# Patient Record
Sex: Female | Born: 1985 | Race: White | Hispanic: Yes | Marital: Single | Smoking: Former smoker
Health system: Southern US, Community
[De-identification: ages and names within clinical notes are randomized; demographics above are authoritative.]

## PROBLEM LIST (undated history)

## (undated) DIAGNOSIS — F419 Anxiety disorder, unspecified: Secondary | ICD-10-CM

## (undated) DIAGNOSIS — Q6589 Other specified congenital deformities of hip: Secondary | ICD-10-CM

## (undated) DIAGNOSIS — R569 Unspecified convulsions: Secondary | ICD-10-CM

## (undated) DIAGNOSIS — E079 Disorder of thyroid, unspecified: Secondary | ICD-10-CM

## (undated) DIAGNOSIS — M810 Age-related osteoporosis without current pathological fracture: Secondary | ICD-10-CM

## (undated) HISTORY — DX: Unspecified convulsions: R56.9

## (undated) HISTORY — DX: Age-related osteoporosis without current pathological fracture: M81.0

## (undated) HISTORY — PX: TUBAL LIGATION: SHX77

## (undated) HISTORY — DX: Disorder of thyroid, unspecified: E07.9

## (undated) HISTORY — DX: Anxiety disorder, unspecified: F41.9

## (undated) HISTORY — PX: TOTAL HIP ARTHROPLASTY: SHX124

## (undated) HISTORY — DX: Other specified congenital deformities of hip: Q65.89

## (undated) HISTORY — PX: OVARIAN CYST REMOVAL: SHX89

---
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
## 2007-08-30 ENCOUNTER — Emergency Department: Payer: Self-pay | Admitting: Emergency Medicine

## 2008-05-20 ENCOUNTER — Ambulatory Visit: Payer: Self-pay | Admitting: Family Medicine

## 2008-05-28 ENCOUNTER — Emergency Department: Payer: Self-pay | Admitting: Internal Medicine

## 2009-11-26 ENCOUNTER — Emergency Department: Payer: Self-pay | Admitting: Emergency Medicine

## 2010-01-07 ENCOUNTER — Emergency Department: Payer: Self-pay | Admitting: Emergency Medicine

## 2010-05-31 ENCOUNTER — Emergency Department: Payer: Self-pay | Admitting: Emergency Medicine

## 2010-10-26 ENCOUNTER — Emergency Department: Payer: Self-pay | Admitting: Emergency Medicine

## 2010-11-13 ENCOUNTER — Emergency Department: Payer: Self-pay | Admitting: Emergency Medicine

## 2010-11-21 ENCOUNTER — Emergency Department: Payer: Self-pay | Admitting: Emergency Medicine

## 2010-11-28 ENCOUNTER — Emergency Department: Payer: Self-pay | Admitting: Emergency Medicine

## 2011-04-02 ENCOUNTER — Observation Stay: Payer: Self-pay | Admitting: Internal Medicine

## 2011-05-11 ENCOUNTER — Emergency Department: Payer: Self-pay | Admitting: Emergency Medicine

## 2012-04-09 ENCOUNTER — Observation Stay: Payer: Self-pay | Admitting: Obstetrics and Gynecology

## 2012-05-23 HISTORY — PX: TUBAL LIGATION: SHX77

## 2012-12-23 LAB — HM PAP SMEAR: HM PAP: NORMAL

## 2013-08-04 DIAGNOSIS — M419 Scoliosis, unspecified: Secondary | ICD-10-CM | POA: Insufficient documentation

## 2013-08-04 DIAGNOSIS — M5416 Radiculopathy, lumbar region: Secondary | ICD-10-CM | POA: Insufficient documentation

## 2014-11-10 ENCOUNTER — Emergency Department: Payer: Self-pay | Admitting: Emergency Medicine

## 2015-06-14 ENCOUNTER — Ambulatory Visit: Payer: Self-pay | Admitting: Family Medicine

## 2015-07-17 ENCOUNTER — Ambulatory Visit (INDEPENDENT_AMBULATORY_CARE_PROVIDER_SITE_OTHER): Payer: Self-pay | Admitting: Family Medicine

## 2015-07-17 ENCOUNTER — Encounter: Payer: Self-pay | Admitting: Family Medicine

## 2015-07-17 ENCOUNTER — Other Ambulatory Visit
Admission: RE | Admit: 2015-07-17 | Discharge: 2015-07-17 | Disposition: A | Discharge status: Home / Self Care | Payer: Self-pay | Source: Ambulatory Visit | Attending: Family Medicine | Admitting: Family Medicine

## 2015-07-17 VITALS — BP 91/60 | HR 64 | Temp 98.3°F | Resp 16 | Ht 64.0 in | Wt 172.6 lb

## 2015-07-17 DIAGNOSIS — E034 Atrophy of thyroid (acquired): Secondary | ICD-10-CM | POA: Insufficient documentation

## 2015-07-17 DIAGNOSIS — M810 Age-related osteoporosis without current pathological fracture: Secondary | ICD-10-CM | POA: Insufficient documentation

## 2015-07-17 DIAGNOSIS — E038 Other specified hypothyroidism: Secondary | ICD-10-CM | POA: Insufficient documentation

## 2015-07-17 DIAGNOSIS — E039 Hypothyroidism, unspecified: Secondary | ICD-10-CM | POA: Insufficient documentation

## 2015-07-17 DIAGNOSIS — R221 Localized swelling, mass and lump, neck: Secondary | ICD-10-CM

## 2015-07-17 LAB — COMPREHENSIVE METABOLIC PANEL
ALK PHOS: 80 U/L (ref 38–126)
ALT: 14 U/L (ref 14–54)
AST: 18 U/L (ref 15–41)
Albumin: 3.9 g/dL (ref 3.5–5.0)
Anion gap: 6 (ref 5–15)
BUN: 12 mg/dL (ref 6–20)
CO2: 26 mmol/L (ref 22–32)
Calcium: 8.9 mg/dL (ref 8.9–10.3)
Chloride: 103 mmol/L (ref 101–111)
Creatinine, Ser: 0.76 mg/dL (ref 0.44–1.00)
GFR calc Af Amer: >60 mL/min (ref >60)
Glucose, Bld: 96 mg/dL (ref 65–99)
Potassium: 4.1 mmol/L (ref 3.5–5.1)
Sodium: 135 mmol/L (ref 135–145)
Total Bilirubin: 0.5 mg/dL (ref 0.3–1.2)
Total Protein: 7.7 g/dL (ref 6.5–8.1)

## 2015-07-17 LAB — TSH: TSH: 7.436 u[IU]/mL — ABNORMAL HIGH (ref 0.350–4.500)

## 2015-07-17 MED ORDER — LEVOTHYROXINE SODIUM 75 MCG PO TABS: 75.0000 ug | ORAL_TABLET | Freq: Every day | ORAL | Status: DC

## 2015-07-17 MED ORDER — OMEPRAZOLE 20 MG PO CPDR: 20.0000 mg | DELAYED_RELEASE_CAPSULE | Freq: Every day | ORAL | Status: DC

## 2015-07-17 NOTE — Progress Notes (Signed)
Subjective:    Patient ID: Christine Herring, female    DOB: 07-25-1986, 29 y.o.   MRN: [id number]  HPI: Christine Herring is a 29 y.o. female presenting on 07/17/2015 for Thyroid Problem   Thyroid Problem Presents for follow-up visit. Symptoms include fatigue and weight gain. Patient reports no cold intolerance, heat intolerance or palpitations. The symptoms have been worsening. Past treatments include levothyroxine. The treatment provided mild relief. The following procedures have not been performed: thyroid ultrasound.  Pt has been out of medications for a few weeks. Unknown last TSH- she has not had lab work in several years.   Pt reports pain in the lower neck. Started on Saturday. Pt reports feeling like something is stuck in her throat. Pt reports it hurts to take a deep breath. She is having trouble swallowing. Pt took tums thinking it was acid reflux- did not help. R>L. If she lays on her R side the throat is painful. When she bends down the throat is painful.   Past Medical History  Diagnosis Date  . Thyroid disease   . Osteoporosis   . Hip dysplasia     No current outpatient prescriptions on file prior to visit.   No current facility-administered medications on file prior to visit.    Review of Systems  Constitutional: Positive for weight gain and fatigue.  HENT: Positive for sore throat and trouble swallowing.   Respiratory: Negative for chest tightness, shortness of breath and wheezing.   Cardiovascular: Negative for chest pain, palpitations and leg swelling.  Gastrointestinal: Positive for nausea.  Endocrine: Negative for cold intolerance, heat intolerance, polydipsia, polyphagia and polyuria.   Per HPI unless specifically indicated above     Objective:    BP 91/60 mmHg  Pulse 64  Temp(Src) 98.3 F (36.8 C) (Oral)  Resp 16  Ht 5\' 4"  (1.626 m)  Wt 172 lb 9.6 oz (78.291 kg)  BMI 29.61 kg/m2  LMP 06/23/2015  Wt Readings from Last 3 Encounters:  07/17/15 172 lb 9.6  oz (78.291 kg)    Physical Exam  Constitutional: She is oriented to person, place, and time. She appears well-developed and well-nourished. No distress.  HENT:  Head: Normocephalic and atraumatic.  Right Ear: Hearing and tympanic membrane normal.  Left Ear: Hearing and tympanic membrane normal.  Nose: No mucosal edema or rhinorrhea.  Mouth/Throat: Posterior oropharyngeal erythema ( mild irritation of the throat.) present. No oropharyngeal exudate, posterior oropharyngeal edema or tonsillar abscesses.  Neck: Normal range of motion. Neck supple. No thyroid mass and no thyromegaly present.    Cardiovascular: Normal rate and regular rhythm.  Exam reveals no gallop and no friction rub.   No murmur heard. Pulmonary/Chest: Effort normal and breath sounds normal.  Abdominal: Soft. Bowel sounds are normal. There is no tenderness. There is no rebound.  Musculoskeletal: Normal range of motion. She exhibits no edema or tenderness.  Lymphadenopathy:    She has no cervical adenopathy.  Neurological: She is alert and oriented to person, place, and time.  Skin: Skin is warm and dry. She is not diaphoretic.   Results for orders placed or performed in visit on 07/17/15  HM PAP SMEAR  Result Value Ref Range   HM Pap smear normal       Assessment & Plan:   Problem List Items Addressed This Visit      Endocrine   Hypothyroidism - Primary    Renew levothyroxine- stressed importance of labwork to determine correct dosing. TSH and CMP ordered.  RTC 6 mos.       Relevant Medications   levothyroxine (SYNTHROID, LEVOTHROID) 75 MCG tablet   Other Relevant Orders   TSH   Comprehensive Metabolic Panel (CMET)    Other Visit Diagnoses    Sensation of lump in throat        GERD vs. mass. Possible thyroid goiter/nodule but not felt on exam. ENT referral to r/o mass. Trial of omeprazole for GERD. Consider thyroid scan if symptoms do not improve.     Relevant Medications    omeprazole (PRILOSEC) 20 MG  capsule    Other Relevant Orders    Ambulatory referral to ENT       Meds ordered this encounter  Medications  . omeprazole (PRILOSEC) 20 MG capsule    Sig: Take 1 capsule (20 mg total) by mouth daily.    Dispense:  30 capsule    Refill:  6    Order Specific Question:  Supervising Provider    Answer:  Janeann Forehand [phone]  . levothyroxine (SYNTHROID, LEVOTHROID) 75 MCG tablet    Sig: Take 1 tablet (75 mcg total) by mouth daily.    Dispense:  90 tablet    Refill:  3    Order Specific Question:  Supervising Provider    Answer:  Janeann Forehand [[id number]]      Follow up plan: Return in about 6 months (around 01/17/2016).

## 2015-07-17 NOTE — Assessment & Plan Note (Signed)
Renew levothyroxine- stressed importance of labwork to determine correct dosing. TSH and CMP ordered.  RTC 6 mos.

## 2015-07-17 NOTE — Patient Instructions (Signed)
Hypothyroidism: Please get lab work done so we can ensure your thyroid dosing is correct.   Lump in throat: Let's try omeprazole to see if it helps your symptoms. It might be acid reflux related. We will also have you evaluated by an ENT specialist to rule out any issues in your throat. IF you have trouble breathing, feel as though your throat is closing, please go to the ER.

## 2015-07-19 ENCOUNTER — Telehealth: Payer: Self-pay | Admitting: Family Medicine

## 2015-07-19 MED ORDER — LEVOTHYROXINE SODIUM 100 MCG PO TABS: 100.0000 ug | ORAL_TABLET | Freq: Every day | ORAL | Status: DC

## 2015-07-19 NOTE — Telephone Encounter (Signed)
Called pt to review lab results. Increased synthroid dose to daily. Pt will need follow-up appt in 3 mos for labwork. Pt also reports since starting omeprazole her throat is feeling much better. I told her to trial a few more days on the PPI and if sensations of lump in the throat completely resolves to let us know and to cancel her ENT appt.

## 2016-04-19 DIAGNOSIS — M21851 Other specified acquired deformities of right thigh: Secondary | ICD-10-CM | POA: Insufficient documentation

## 2016-04-19 DIAGNOSIS — M1632 Unilateral osteoarthritis resulting from hip dysplasia, left hip: Secondary | ICD-10-CM | POA: Insufficient documentation

## 2016-04-19 DIAGNOSIS — M21852 Other specified acquired deformities of left thigh: Secondary | ICD-10-CM | POA: Insufficient documentation

## 2016-05-31 ENCOUNTER — Emergency Department: Payer: Managed Care, Other (non HMO)

## 2016-05-31 ENCOUNTER — Emergency Department
Admission: EM | Admit: 2016-05-31 | Discharge: 2016-05-31 | Disposition: A | Discharge status: Home / Self Care | Payer: Managed Care, Other (non HMO) | Attending: Emergency Medicine | Admitting: Emergency Medicine

## 2016-05-31 ENCOUNTER — Encounter: Payer: Self-pay | Admitting: Emergency Medicine

## 2016-05-31 DIAGNOSIS — Z96649 Presence of unspecified artificial hip joint: Secondary | ICD-10-CM | POA: Insufficient documentation

## 2016-05-31 DIAGNOSIS — Z79899 Other long term (current) drug therapy: Secondary | ICD-10-CM | POA: Insufficient documentation

## 2016-05-31 DIAGNOSIS — R109 Unspecified abdominal pain: Secondary | ICD-10-CM | POA: Insufficient documentation

## 2016-05-31 DIAGNOSIS — E039 Hypothyroidism, unspecified: Secondary | ICD-10-CM | POA: Diagnosis not present

## 2016-05-31 DIAGNOSIS — Z87891 Personal history of nicotine dependence: Secondary | ICD-10-CM | POA: Diagnosis not present

## 2016-05-31 LAB — COMPREHENSIVE METABOLIC PANEL
ALBUMIN: 4.3 g/dL (ref 3.5–5.0)
ALT: 19 U/L (ref 14–54)
ANION GAP: 13 (ref 5–15)
AST: 28 U/L (ref 15–41)
Alkaline Phosphatase: 76 U/L (ref 38–126)
BUN: 12 mg/dL (ref 6–20)
CO2: 18 mmol/L — AB (ref 22–32)
Calcium: 9 mg/dL (ref 8.9–10.3)
Chloride: 105 mmol/L (ref 101–111)
Creatinine, Ser: 0.82 mg/dL (ref 0.44–1.00)
GFR calc Af Amer: >60 mL/min (ref >60)
GFR calc non Af Amer: >60 mL/min (ref >60)
Glucose, Bld: 141 mg/dL — ABNORMAL HIGH (ref 65–99)
POTASSIUM: 3.2 mmol/L — AB (ref 3.5–5.1)
SODIUM: 136 mmol/L (ref 135–145)
TOTAL PROTEIN: 8.2 g/dL — AB (ref 6.5–8.1)
Total Bilirubin: 0.8 mg/dL (ref 0.3–1.2)

## 2016-05-31 LAB — CBC WITH DIFFERENTIAL/PLATELET
BASOS PCT: 0 %
Basophils Absolute: 0 10*3/uL (ref 0–0.1)
EOS ABS: 0.1 10*3/uL (ref 0–0.7)
Eosinophils Relative: 1 %
HCT: 38.7 % (ref 35.0–47.0)
Hemoglobin: 12.9 g/dL (ref 12.0–16.0)
Lymphocytes Relative: 23 %
Lymphs Abs: 2 10*3/uL (ref 1.0–3.6)
MCH: 25.9 pg — ABNORMAL LOW (ref 26.0–34.0)
MCHC: 33.3 g/dL (ref 32.0–36.0)
MCV: 77.8 fL — ABNORMAL LOW (ref 80.0–100.0)
MONO ABS: 0.5 10*3/uL (ref 0.2–0.9)
Monocytes Relative: 6 %
Neutro Abs: 6.3 10*3/uL (ref 1.4–6.5)
Neutrophils Relative %: 70 %
PLATELETS: 255 10*3/uL (ref 150–440)
RBC: 4.98 MIL/uL (ref 3.80–5.20)
RDW: 15.3 % — AB (ref 11.5–14.5)
WBC: 9 10*3/uL (ref 3.6–11.0)

## 2016-05-31 LAB — URINALYSIS COMPLETE WITH MICROSCOPIC (ARMC ONLY)
Bilirubin Urine: NEGATIVE
Glucose, UA: NEGATIVE mg/dL
HGB URINE DIPSTICK: NEGATIVE
Nitrite: NEGATIVE
PH: 7 (ref 5.0–8.0)
Protein, ur: NEGATIVE mg/dL
Specific Gravity, Urine: 1.018 (ref 1.005–1.030)

## 2016-05-31 LAB — PREGNANCY, URINE: Preg Test, Ur: NEGATIVE

## 2016-05-31 LAB — LIPASE, BLOOD: Lipase: 35 U/L (ref 11–51)

## 2016-05-31 MED ORDER — DIAZEPAM 5 MG PO TABS: 5.0000 mg | ORAL_TABLET | Freq: Three times a day (TID) | ORAL | Status: DC | PRN

## 2016-05-31 MED ORDER — SODIUM CHLORIDE 0.9 % IV SOLN
1000.0000 mL | Freq: Once | INTRAVENOUS | Status: AC
  Administered 2016-05-31: 1000 mL via INTRAVENOUS

## 2016-05-31 MED ORDER — HYDROMORPHONE HCL 1 MG/ML IJ SOLN
INTRAMUSCULAR | Status: DC
Start: 2016-05-31 — End: 2016-05-31
  Filled 2016-05-31: qty 1

## 2016-05-31 MED ORDER — HYDROMORPHONE HCL 1 MG/ML IJ SOLN
1.0000 mg | Freq: Once | INTRAMUSCULAR | Status: AC
  Administered 2016-05-31: 1 mg via INTRAVENOUS

## 2016-05-31 MED ORDER — KETOROLAC TROMETHAMINE 30 MG/ML IJ SOLN
30.0000 mg | Freq: Once | INTRAMUSCULAR | Status: AC
  Administered 2016-05-31: 30 mg via INTRAVENOUS
  Filled 2016-05-31: qty 1

## 2016-05-31 MED ORDER — IBUPROFEN 800 MG PO TABS: 800.0000 mg | ORAL_TABLET | Freq: Three times a day (TID) | ORAL | Status: DC | PRN

## 2016-05-31 NOTE — ED Notes (Signed)
Pt taken to CT.

## 2016-05-31 NOTE — ED Notes (Signed)
[facility] with reports of bilateral flank pain. Pt reports body aches and thought she had the flu. Pt denies dysuria. Pt denies nausea, vomiting and diarrhea. Pt has a history of of hip dysplasia and is scheduled for hip replacement in July.

## 2016-05-31 NOTE — Discharge Instructions (Signed)
Flank Pain °Flank pain refers to pain that is located on the side of the body between the upper abdomen and the back. The pain may occur over a short period of time (acute) or may be long-term or reoccurring (chronic). It may be mild or severe. Flank pain can be caused by many things. °CAUSES  °Some of the more common causes of flank pain include: °· Muscle strains.   °· Muscle spasms.   °· A disease of your spine (vertebral disk disease).   °· A lung infection (pneumonia).   °· Fluid around your lungs (pulmonary edema).   °· A kidney infection.   °· Kidney stones.   °· A very painful skin rash caused by the chickenpox virus (shingles).   °· Gallbladder disease.   °HOME CARE INSTRUCTIONS  °Home care will depend on the cause of your pain. In general, °· Rest as directed by your caregiver. °· Drink enough fluids to keep your urine clear or pale yellow. °· Only take over-the-counter or prescription medicines as directed by your caregiver. Some medicines may help relieve the pain. °· Tell your caregiver about any changes in your pain. °· Follow up with your caregiver as directed. °SEEK IMMEDIATE MEDICAL CARE IF:  °· Your pain is not controlled with medicine.   °· You have new or worsening symptoms. °· Your pain increases.   °· You have abdominal pain.   °· You have shortness of breath.   °· You have persistent nausea or vomiting.   °· You have swelling in your abdomen.   °· You feel faint or pass out.   °· You have blood in your urine. °· You have a fever or persistent symptoms for more than 2-3 days. °· You have a fever and your symptoms suddenly get worse. °MAKE SURE YOU:  °· Understand these instructions. °· Will watch your condition. °· Will get help right away if you are not doing well or get worse. °  °This information is not intended to replace advice given to you by your health care provider. Make sure you discuss any questions you have with your health care provider. °  °Document Released: 01/30/2006 Document  Revised: 09/02/2012 Document Reviewed: 07/23/2012 °Elsevier Interactive Patient Education ©2016 [employer] ° °

## 2016-05-31 NOTE — ED Provider Notes (Signed)
[facility] Emergency Department Provider Note        Time seen: ----------------------------------------- 6:20 PM on 05/31/2016 -----------------------------------------    I have reviewed the triage vital signs and the nursing notes.   HISTORY  Chief Complaint Flank Pain    HPI Christine Herring is a 30 y.o. female who presents the ER for left-sided flank pain. She reports body aches that began yesterday and thought she had the flu. She has not had fever or chills, has had some sore throat and congestion. She denies any dysuria, nausea, vomiting or diarrhea. She does have history of hip dysplasia and has hip replacements surgery scheduled for July. She states she has been walking normally.   Past Medical History  Diagnosis Date  . Thyroid disease   . Osteoporosis   . Hip dysplasia     Patient Active Problem List   Diagnosis Date Noted  . Hypothyroidism 07/17/2015  . Disorder of pelvis 07/17/2015  . OP (osteoporosis) 07/17/2015    Past Surgical History  Procedure Laterality Date  . Tubal ligation    . Total hip arthroplasty Right     Allergies Review of patient's allergies indicates no known allergies.  Social History Social History  Substance Use Topics  . Smoking status: Former Smoker -- 0.50 packs/day for 10 years    Quit date: 07/16/2005  . Smokeless tobacco: Never Used  . Alcohol Use: Yes    Review of Systems Constitutional: Negative for fever.Positive for body aches Eyes: Negative for visual changes. ENT: Negative for sore throat. Cardiovascular: Negative for chest pain. Respiratory: Negative for shortness of breath. Gastrointestinal:Positive for left flank pain Genitourinary: Negative for dysuria. Musculoskeletal: Positive for left-sided low back pain Skin: Negative for rash. Neurological: Negative for headaches, focal weakness or numbness.  10-point ROS otherwise  negative.  ____________________________________________   PHYSICAL EXAM:  VITAL SIGNS: ED Triage Vitals  Enc Vitals Group     BP 05/31/16 1754 115/63 mmHg     Pulse Rate 05/31/16 1754 102     Resp 05/31/16 1754 26     Temp 05/31/16 1754 98.5 F (36.9 C)     Temp Source 05/31/16 1754 Oral     SpO2 05/31/16 1754 100 %     Weight 05/31/16 1754 165 lb (74.844 kg)     Height 05/31/16 1754  (1.626 m)     Head Cir --      Peak Flow --      Pain Score 05/31/16 1806 7     Pain Loc --      Pain Edu? --      Excl. in GC? --    Constitutional: Alert and oriented. Well appearing and in no distress. Eyes: Conjunctivae are normal. PERRL. Normal extraocular movements. ENT   Head: Normocephalic and atraumatic.   Nose: No congestion/rhinnorhea.   Mouth/Throat: Mucous membranes are moist.   Neck: No stridor. Cardiovascular: Normal rate, regular rhythm. No murmurs, rubs, or gallops. Respiratory: Normal respiratory effort without tachypnea nor retractions. Breath sounds are clear and equal bilaterally. No wheezes/rales/rhonchi. Gastrointestinal: Soft and nontender. Left CVA tenderness Musculoskeletal: Nontender with normal range of motion in all extremities. No lower extremity tenderness nor edema. Neurologic:  Normal speech and language. No gross focal neurologic deficits are appreciated.  Skin:  Skin is warm, dry and intact. No rash noted. Psychiatric: Mood and affect are normal. Speech and behavior are normal.  ___________________________________________  ED COURSE:  Pertinent labs & imaging results that were available during my  care of the patient were reviewed by me and considered in my medical decision making (see chart for details). Patient is no acute distress, will check basic labs, give IV Toradol ____________________________________________    LABS (pertinent positives/negatives)  Labs Reviewed  URINALYSIS COMPLETEWITH MICROSCOPIC ([facility] ONLY) - Abnormal;  Notable for the following:    Color, Urine YELLOW (*)    APPearance CLEAR (*)    Ketones, ur 1+ (*)    Leukocytes, UA TRACE (*)    Bacteria, UA RARE (*)    Squamous Epithelial / LPF 0-5 (*)    All other components within normal limits  CBC WITH DIFFERENTIAL/PLATELET - Abnormal; Notable for the following:    MCV 77.8 (*)    MCH 25.9 (*)    RDW 15.3 (*)    All other components within normal limits  COMPREHENSIVE METABOLIC PANEL - Abnormal; Notable for the following:    Potassium 3.2 (*)    CO2 18 (*)    Glucose, Bld 141 (*)    Total Protein 8.2 (*)    All other components within normal limits  LIPASE, BLOOD  PREGNANCY, URINE    RADIOLOGY Images were viewed by me  CT renal protocol  IMPRESSION: 1. There is no evidence of nephrolithiasis. No hydronephrosis or hydroureter. 2. No calcified ureteral calculi are noted. 3. No calcified calculi are noted within urinary bladder. 4. No pericecal inflammation. Normal appendix. 5. Umbilical and periumbilical hernia containing fat measures about 2.8 cm without evidence of acute complication. 6. Stable postsurgical changes bilateral proximal femur. ____________________________________________  FINAL ASSESSMENT AND PLAN  Flank pain  Plan: Patient with labs and imaging as dictated above. CT scan will be labs and urine were reassuring as dictated above. Possible muscle spasm. She'll be discharged with Motrin and Valium and encouraged to have close follow-up with her doctor for reevaluation.   Emily FilbertWilliams, Jonathan E, MD   Note: This dictation was prepared with Dragon dictation. Any transcriptional errors that result from this process are unintentional   Emily FilbertJonathan E Williams, MD 05/31/16 2022

## 2016-07-02 ENCOUNTER — Encounter: Payer: Self-pay | Admitting: Family Medicine

## 2016-07-02 ENCOUNTER — Ambulatory Visit: Payer: Self-pay | Admitting: Family Medicine

## 2016-07-02 DIAGNOSIS — I9589 Other hypotension: Secondary | ICD-10-CM | POA: Insufficient documentation

## 2016-07-02 DIAGNOSIS — E876 Hypokalemia: Secondary | ICD-10-CM | POA: Insufficient documentation

## 2016-07-02 DIAGNOSIS — Z9289 Personal history of other medical treatment: Secondary | ICD-10-CM | POA: Insufficient documentation

## 2016-07-02 DIAGNOSIS — J9819 Other pulmonary collapse: Secondary | ICD-10-CM | POA: Insufficient documentation

## 2016-07-02 DIAGNOSIS — Z8669 Personal history of other diseases of the nervous system and sense organs: Secondary | ICD-10-CM | POA: Insufficient documentation

## 2016-07-02 DIAGNOSIS — Z8619 Personal history of other infectious and parasitic diseases: Secondary | ICD-10-CM | POA: Insufficient documentation

## 2016-07-11 DIAGNOSIS — Z96642 Presence of left artificial hip joint: Secondary | ICD-10-CM | POA: Insufficient documentation

## 2016-08-02 ENCOUNTER — Encounter: Payer: Self-pay | Admitting: Physical Therapy

## 2016-08-02 ENCOUNTER — Ambulatory Visit: Payer: Managed Care, Other (non HMO) | Attending: Surgery | Admitting: Physical Therapy

## 2016-08-02 DIAGNOSIS — R262 Difficulty in walking, not elsewhere classified: Secondary | ICD-10-CM | POA: Insufficient documentation

## 2016-08-02 DIAGNOSIS — R2689 Other abnormalities of gait and mobility: Secondary | ICD-10-CM | POA: Diagnosis present

## 2016-08-02 DIAGNOSIS — M6281 Muscle weakness (generalized): Secondary | ICD-10-CM | POA: Insufficient documentation

## 2016-08-02 NOTE — Therapy (Signed)
[facility] [address], [place], [address] Phone: [phone]   Fax:  [phone]  Physical Therapy Evaluation  Patient Details  Name: Christine Herring MRN: [id number] Date of Birth: Apr 25, 1986 Referring Provider: Dr. Melvyn Neth  Encounter Date: 08/02/2016      PT End of Session - 08/02/16 1141    Visit Number 1   Number of Visits 25   Date for PT Re-Evaluation 08/30/16   PT Start Time 1115   PT Stop Time 1205   PT Time Calculation (min) 50 min   Activity Tolerance Patient tolerated treatment well   Behavior During Therapy [facility] for tasks assessed/performed      Past Medical History:  Diagnosis Date  . Hip dysplasia   . Osteoporosis   . Thyroid disease     Past Surgical History:  Procedure Laterality Date  . TOTAL HIP ARTHROPLASTY Right   . TUBAL LIGATION      There were no vitals filed for this visit.       Subjective Assessment - 08/02/16 1114    Subjective 30 y/o female with history of hip dysplasia. The patient has had previous surgery on bilateral hips with blade plate insertion. The patient was continuing to have pain in the left hip secondary to hip dysplasia and OA. The patient was followed by Dr Melvyn Neth and decided to proceed with Left THA. Surgery was on 07/11/16. Pt reports she isn't having much hip pain, but rather L knee pain. Occasionally the L knee becomes swollen if she sits a while. Pt reports she previously had a hx of leg length discrepancy, but MD told her after the surgery it is mild.    Pertinent History hx of B hip displasia, B hip labral reconstruction, osteroporosis, leg length discrepancy?   Limitations Sitting;Standing;Walking   How long can you sit comfortably? 2 to 3 hours, has to eleval L LE for comfort occasionally   How long can you stand comfortably? limited time, 20 to 30 minutes   How long can you walk comfortably? household distances   Patient Stated Goals to get back to  normal   Currently in Pain? Yes   Pain Score 2   discomfort of L knee   Pain Type Surgical pain;Acute pain   Pain Onset 1 to 4 weeks ago   Aggravating Factors  extensive activity   Pain Relieving Factors elevating and ice   Effect of Pain on Daily Activities limited activity            [facility] PT Assessment - 08/02/16 0001      Assessment   Medical Diagnosis L THR   Referring Provider Dr. Melvyn Neth   Onset Date/Surgical Date 07/11/16   Hand Dominance Right   Next MD Visit --  first week of September   Prior Therapy PT in the hospital     Precautions   Precautions Anterior Hip     Restrictions   Weight Bearing Restrictions Yes   LLE Weight Bearing Partial weight bearing   LLE Partial Weight Bearing Percentage or Pounds 50%     Balance Screen   Has the patient fallen in the past 6 months No   Has the patient had a decrease in activity level because of a fear of falling?  Yes     Home Environment   Living Environment Private residence   Living Arrangements Children;Other relatives   Available Help at Discharge Family   Type of Home House  Home Access Ramped entrance   Home Layout One level   Home Equipment Walker - 2 wheels;Crutches;Cane - single point;Toilet riser;Bedside commode     Prior Function   Level of Independence Independent   Vocation Full time employment;Student   Vocation Requirements desk job, walking to/from different parts of office   Leisure travel, dance, swim, spend time with family       POSTURE/OBSERVATION: [facility]  PROM/AROM: L hip flexion ~100 deg L knee flexion ~115  Limited by stiffness and discomfort  STRENGTH:  Graded on a 0-5 scale Muscle Group Left Right  Hip Flex 2/5 5/5  Hip Abd 3-/5 5/5  Hip Add 3/5 5/5  Hip Ext NT   Hip IR/ER    Knee Flex 3+/5 5/5  Knee Ext 3+/5 5/5  Ankle DF 5/5 5/5  Ankle PF     SENSATION: L LE L1 to L3 decreased to light touch by ~30%   FUNCTIONAL MOBILITY: Limited ambulation, standing and  sitting tolerance. Difficulty bending.  BALANCE: Good  GAIT: Slow speed, B Trendelenburg, decreased trunk rotation, using crutches, decreased B hip flexion in swing phase  OUTCOME MEASURES: TEST Outcome Interpretation  5 times sit<>stand 17.98 sec >60 yo, >15 sec indicates increased risk for falls  10 meter walk test           0.42  m/s <1.0 m/s indicates increased risk for falls; limited community ambulator  Timed up and Go         27.01  sec <14 sec indicates increased risk for falls   LEFS 14/80  Therex:  Supine L LE: heel slide, hip abd slides, hip add squeezes, and bridges x10 each  Cues for proper technique to target specific muscles and for slow eccentric contractions for most effective strengthening.   HEP handout provided. See pt instructions.                      PT Education - 08/02/16 1140    Education provided Yes   Education Details HEP, see pt instructions   Person(s) Educated Patient   Methods Explanation;Demonstration;Handout   Comprehension Verbalized understanding;Returned demonstration             PT Long Term Goals - 08/02/16 1217      PT LONG TERM GOAL #1   Title Pt will improve 5x STS to <10 seconds for improved functional mobility within 12 weeks.   Baseline 17.98 sec   Time 12   Period Weeks   Status New     PT LONG TERM GOAL #2   Title Pt will improve TUG to <10 seconds for improved functional mobility within 12 weeks with LRAD.   Baseline 27.01 sec with crutches   Time 12   Period Weeks   Status New     PT LONG TERM GOAL #3   Title Pt will score >1.0 m/s with LRAD on 10 MW test for community mobility and return to PLOF within 12 weeks.   Baseline 0.42 m/s with crutches   Time 12   Period Weeks   Status New     PT LONG TERM GOAL #4   Title Pt will improve LEFS score by 9 points for improved functional mobility within 12 weeks.   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 She is 50% WB on L LE. Pt demonstrates deficits of strength (primarily hip) and gait. She c/o tightness and discomfort in L knee, with some swelling noted in comparison to R knee during assessment. Her gait speed is reudced significantly and requires use of crutches. Pt will benefit from skilled PT to address functional deficits to progress towards PLOF.   Rehab Potential Good   Clinical Impairments Affecting Rehab Potential (+) age, (-) hx of hip displasia   PT Frequency 2x / week   PT Duration 12 weeks   PT Treatment/Interventions DME Instruction;Gait training;Stair training;Therapeutic activities;Therapeutic exercise;Balance training;Neuromuscular re-education;Patient/family education;Manual techniques;Passive range of motion   PT Next Visit Plan Le strengthening   PT Home Exercise Plan supine heel slides, hip abd slides, hip add squeezes, bridge   Consulted and Agree with Plan of Care Patient      Patient will benefit from skilled therapeutic intervention in order to improve the following deficits and impairments:  Abnormal gait, Decreased knowledge of use of DME, Decreased strength, Difficulty walking, Impaired flexibility, Impaired sensation, Pain  Visit Diagnosis: Muscle weakness (generalized) - Plan: PT plan of care cert/re-cert  Difficulty in walking, not elsewhere classified - Plan: PT plan of care cert/re-cert  Other abnormalities of gait and mobility - Plan: PT plan of care cert/re-cert     Problem List Patient Active Problem List   Diagnosis Date Noted  . Hypothyroidism 07/17/2015  . Disorder of pelvis 07/17/2015  . OP (osteoporosis) 07/17/2015   Karsten Howry Lucillie GarfinkelJo Ovida Delagarza, PT, DPT  08/02/16, 12:32 PM [phone]  [facility] [address] [place], [place], [address] Phone: [phone]    Fax:  [phone]  Name: Christine Herring MRN: [id number] Date of Birth: June 03, 1986

## 2016-08-02 NOTE — Patient Instructions (Addendum)
  HEP: supine heel slides, hip abd slides, bridges, hip add squeezes 2x10. Created on [email or web address]

## 2016-08-05 ENCOUNTER — Encounter: Payer: Self-pay | Admitting: Physical Therapy

## 2016-08-05 ENCOUNTER — Ambulatory Visit: Payer: Managed Care, Other (non HMO) | Admitting: Physical Therapy

## 2016-08-05 DIAGNOSIS — R262 Difficulty in walking, not elsewhere classified: Secondary | ICD-10-CM

## 2016-08-05 DIAGNOSIS — M6281 Muscle weakness (generalized): Secondary | ICD-10-CM | POA: Diagnosis not present

## 2016-08-05 DIAGNOSIS — R2689 Other abnormalities of gait and mobility: Secondary | ICD-10-CM

## 2016-08-05 NOTE — Therapy (Signed)
[facility] [address] [place], [place], [address] Phone: [phone]   Fax:  [phone]  Physical Therapy Treatment  Patient Details  Name: Christine HamSilvana Herring MRN: [id number] Date of Birth: 10/04/1986 Referring Provider: Dr. Melvyn NethLewis  Encounter Date: 08/05/2016      Christine Herring End of Session - 08/05/16 1528    Visit Number 2   Number of Visits 25   Date for Christine Herring Re-Evaluation 08/30/16   Christine Herring Start Time 1518   Christine Herring Stop Time 1600   Christine Herring Time Calculation (min) 42 min   Activity Tolerance Patient tolerated treatment well   Behavior During Therapy [facility] for tasks assessed/performed      Past Medical History:  Diagnosis Date  . Hip dysplasia   . Osteoporosis   . Thyroid disease     Past Surgical History:  Procedure Laterality Date  . TOTAL HIP ARTHROPLASTY Right   . TUBAL LIGATION      There were no vitals filed for this visit.      Subjective Assessment - 08/05/16 1523    Subjective Christine Herring reports she is doing well. HEP is going good. Reports some soreness the couple of days after last session, but tolerable.    Pertinent History hx of B hip displasia, B hip labral reconstruction, osteroporosis, leg length discrepancy?   Limitations Sitting;Standing;Walking   How long can you sit comfortably? 2 to 3 hours, has to eleval L LE for comfort occasionally   How long can you stand comfortably? limited time, 20 to 30 minutes   How long can you walk comfortably? household distances   Patient Stated Goals to get back to normal   Currently in Pain? No/denies   Pain Onset 1 to 4 weeks ago      Therex:   Supine L LE: Heel slide, hip abd slides, hip add squeezes, clamshell with YTB and bridges 2x10 each SAQ 2x10  Seated L LE: LAQ 2x10 Marching 2x10, unable to clear the table fully due to weakness HS curl with YTB 2x10   Cues for proper technique to target specific muscles and for slow eccentric contractions for most effective  strengthening.   Manual treatment:  STM and trigger point release with edge tool and with hands to L quad muscles to reduce muscle tightness and discomfort. Several trigger points present. After manual treatment, Christine Herring reports reduced tightness and discomfort in L quads. Monitored Christine Herring's tolerance throughout.  Educated on DOMS and possibility of mild bruising.                            Christine Herring Education - 08/05/16 1527    Education provided Yes   Education Details continue HEP, self massage to L quads to reduce muscle guarding   Person(s) Educated Patient   Methods Explanation;Demonstration   Comprehension Verbalized understanding;Returned demonstration             Christine Herring Long Term Goals - 08/02/16 1217      Christine Herring LONG TERM GOAL #1   Title Christine Herring will improve 5x STS to <10 seconds for improved functional mobility within 12 weeks.   Baseline 17.98 sec   Time 12   Period Weeks   Status New     Christine Herring LONG TERM GOAL #2   Title Christine Herring will improve TUG to <10 seconds for improved functional mobility within 12 weeks with LRAD.   Baseline 27.01 sec with crutches   Time 12  Period Weeks   Status New     Christine Herring LONG TERM GOAL #3   Title Christine Herring will score >1.0 m/s with LRAD on 10 MW test for community mobility and return to PLOF within 12 weeks.   Baseline 0.42 m/s with crutches   Time 12   Period Weeks   Status New     Christine Herring LONG TERM GOAL #4   Title Christine Herring will improve LEFS score by 9 points for improved functional mobility within 12 weeks.   Baseline 14/80   Time 12   Period Weeks   Status New               Plan - 08/05/16 1529    Clinical Impression Statement Christine Herring was able to tolerate increased reps of exercises this session. Hip abduction exercises remain most difficult to perform. L quads present tight and some inflammation present. Post STM, Christine Herring reported reduced muscle tightness and discomfort in L quads. Christine Herring assessed L patella and appears to be tracking more medially in  comparison to the R patella. With a trial of M/L patella mobs, Christine Herring was able to perform SAQ with improved comfort in L knee. Christine Herring's adductors are stronger than abductors and may be contributing to the medial tracking of the L patella. She may benefit from manual treatment if L patella. Christine Herring instructed in self massage to attempt to reduce muscle guarding. She will benefit from continued LE strengthening to progress towards functional goals.   Rehab Potential Good   Clinical Impairments Affecting Rehab Potential (+) age, (-) hx of hip displasia   Christine Herring Frequency 2x / week   Christine Herring Duration 12 weeks   Christine Herring Treatment/Interventions DME Instruction;Gait training;Stair training;Therapeutic activities;Therapeutic exercise;Balance training;Neuromuscular re-education;Patient/family education;Manual techniques;Passive range of motion   Christine Herring Next Visit Plan Le strengthening   Christine Herring Home Exercise Plan supine heel slides, hip abd slides, hip add squeezes, bridge   Consulted and Agree with Plan of Care Patient      Patient will benefit from skilled therapeutic intervention in order to improve the following deficits and impairments:  Abnormal gait, Decreased knowledge of use of DME, Decreased strength, Difficulty walking, Impaired flexibility, Impaired sensation, Pain  Visit Diagnosis: Muscle weakness (generalized)  Difficulty in walking, not elsewhere classified  Other abnormalities of gait and mobility     Problem List Patient Active Problem List   Diagnosis Date Noted  . Hypothyroidism 07/17/2015  . Disorder of pelvis 07/17/2015  . OP (osteoporosis) 07/17/2015    Christine Herring Christine Herring, Christine Herring, Christine Herring  08/05/16, 4:12 PM [phone]  [place] [facility] [address] [place], [place], [address] Phone: [phone]   Fax:  [phone]  Name: Christine Herring MRN: [id number] Date of Birth: 03/23/1986

## 2016-08-06 ENCOUNTER — Other Ambulatory Visit: Payer: Self-pay | Admitting: Family Medicine

## 2016-08-08 ENCOUNTER — Ambulatory Visit: Payer: Managed Care, Other (non HMO) | Admitting: Physical Therapy

## 2016-08-08 ENCOUNTER — Encounter: Payer: Self-pay | Admitting: Physical Therapy

## 2016-08-08 DIAGNOSIS — R262 Difficulty in walking, not elsewhere classified: Secondary | ICD-10-CM

## 2016-08-08 DIAGNOSIS — M6281 Muscle weakness (generalized): Secondary | ICD-10-CM | POA: Diagnosis not present

## 2016-08-09 NOTE — Therapy (Signed)
[facility] [address] [place], [place], [address] Phone: [phone]   Fax:  [phone]  Physical Therapy Treatment  Patient Details  Name: Christine Herring MRN: [id number] Date of Birth: August 17, 1986 Referring Provider: Dr. Melvyn NethLewis  Encounter Date: 08/08/2016      PT End of Session - 08/08/16 0827    Visit Number 3   Number of Visits 25   Date for PT Re-Evaluation 08/30/16   PT Start Time 0820   PT Stop Time 0915   PT Time Calculation (min) 55 min   Equipment Utilized During Treatment Other (comment)  axillary crutches   Activity Tolerance Patient tolerated treatment well   Behavior During Therapy [facility] for tasks assessed/performed      Past Medical History:  Diagnosis Date  . Hip dysplasia   . Osteoporosis   . Thyroid disease     Past Surgical History:  Procedure Laterality Date  . TOTAL HIP ARTHROPLASTY Right   . TUBAL LIGATION      There were no vitals filed for this visit.      Subjective Assessment - 08/08/16 0823    Subjective Patient reports her hip is a little sore today and may be due to a lot of walking yesterday. Pain is primarily in her knee.    Limitations Sitting;Standing;Walking   Patient Stated Goals to get back to normal in order to be able to perform daily tasks difficulty   Currently in Pain? Yes   Pain Score 4    Pain Location Knee   Pain Orientation Left   Pain Descriptors / Indicators Aching   Pain Type Surgical pain;Acute pain   Pain Onset 1 to 4 weeks ago  07/11/2016   Pain Frequency Intermittent      Objective: Gait: ambulating into clinic with bilateral axillary crutches, PWB left LE, increased toe out as compared to right LE Strength: significant decrease left hip flexion, knee extension, hip abduction, ER Palpation: + increased tenderness and decreased soft tissue mobility around incision/lateral aspect of left thigh to just above knee  Treatment: Manual  therapy: STM: performed gentle, superficial techniques along lateral left hip/thigh along incision: goal: pain, improve soft tissue elasticity  Therapeutic exercises: patient performed exercises with guidance/assistance, verbal and tactile cues and demonstration of PT: Sitting; Roll ball under foot: patient unable to raise left LE to place foot on ball and then LE fell off ball and required moderate assistance to maintain foot on ball for 10 reps knee flexion/extension Hip abduction with resistive band with isometric hold end range x 3-5 seconds x 10 reps Hip adduction with ball with glute sets x 10 reps Supine lying:  Assisted hip abduction with knee extended with isometric hold at end range 10 reps and 10 reps through short arc  Quad sets x 10 Hook lying; assisted heel slides x 10 reps for controlling hip motion and activating correct muslces TrA contraction with glute sets in hook lying x 10 reps hold 5 seconds  Patient response to treatment: Patient able to contract appropriate muscles with repetition and facilitation of hip abductors, required moderate assistance with most exercises due to significant weakness in left hip musculature, improved positioning and technique following repetitive cuing, tactile and verbal. Able to control left hip during ambulation with less toe out following treatment and decreased c/o left knee pain to mild          PT Education - 08/08/16 0915    Education provided Yes  Education Details HEP: work on isolating muslces to activate hip abductors, work on hip control with ball on floor, isometric hip adduction with glute sets, modified bridging to TrA contractions only   Person(s) Educated Patient   Methods Explanation;Demonstration;Verbal cues;Handout;Tactile cues   Comprehension Verbalized understanding;Returned demonstration;Verbal cues required;Tactile cues required             PT Long Term Goals - 08/02/16 1217      PT LONG TERM GOAL #1    Title Pt will improve 5x STS to <10 seconds for improved functional mobility within 12 weeks.   Baseline 17.98 sec   Time 12   Period Weeks   Status New     PT LONG TERM GOAL #2   Title Pt will improve TUG to <10 seconds for improved functional mobility within 12 weeks with LRAD.   Baseline 27.01 sec with crutches   Time 12   Period Weeks   Status New     PT LONG TERM GOAL #3   Title Pt will score >1.0 m/s with LRAD on 10 MW test for community mobility and return to PLOF within 12 weeks.   Baseline 0.42 m/s with crutches   Time 12   Period Weeks   Status New     PT LONG TERM GOAL #4   Title Pt will improve LEFS score by 9 points for improved functional mobility within 12 weeks.   Baseline 14/80   Time 12   Period Weeks   Status New               Plan - 08/08/16 0915    Clinical Impression Statement Patient continues with significant weakness in left hip musculature that limits ability to walk and perform activities without difficulty. she requires tactile and verbal cues to perform all exercises with correct alignment and to activate appropriate muslces during exercises. She will benefit from continued physical therapy intervention to address limitations and achieve goals.    Rehab Potential Good   PT Frequency 2x / week   PT Duration 12 weeks   PT Treatment/Interventions DME Instruction;Gait training;Stair training;Therapeutic activities;Therapeutic exercise;Balance training;Neuromuscular re-education;Patient/family education;Manual techniques;Passive range of motion;Electrical Stimulation   PT Next Visit Plan focus on motor control, strengthening left hip    PT Home Exercise Plan sitting hip adduction with glute sets, hip abduction seated with resistive band with isometric holds, roll ball under foot, TrA hook lying position, quad sets, glute sets      Patient will benefit from skilled therapeutic intervention in order to improve the following deficits and  impairments:  Abnormal gait, Decreased knowledge of use of DME, Decreased strength, Difficulty walking, Impaired flexibility, Impaired sensation, Pain, Increased muscle spasms  Visit Diagnosis: Muscle weakness (generalized)  Difficulty in walking, not elsewhere classified     Problem List Patient Active Problem List   Diagnosis Date Noted  . Hypothyroidism 07/17/2015  . Disorder of pelvis 07/17/2015  . OP (osteoporosis) 07/17/2015    Beacher MayBrooks, Marie PT 08/09/2016, 1:20 PM  [facility] [facility] [address] [place], [place], [address] Phone: [phone]   Fax:  [phone]  Name: Christine Herring MRN: [id number] Date of Birth: 1986-12-08

## 2016-08-13 ENCOUNTER — Ambulatory Visit: Payer: Managed Care, Other (non HMO) | Admitting: Physical Therapy

## 2016-08-13 ENCOUNTER — Encounter: Payer: Self-pay | Admitting: Physical Therapy

## 2016-08-13 DIAGNOSIS — M6281 Muscle weakness (generalized): Secondary | ICD-10-CM | POA: Diagnosis not present

## 2016-08-13 DIAGNOSIS — R262 Difficulty in walking, not elsewhere classified: Secondary | ICD-10-CM

## 2016-08-13 NOTE — Therapy (Signed)
[place] [facility] [address] [place], [place], [address] Phone: [phone]   Fax:  [phone]  Physical Therapy Treatment  Patient Details  Name: Maxcine HamSilvana Harned MRN: [id number] Date of Birth: 1986-02-17 Referring Provider: Dr. Melvyn NethLewis  Encounter Date: 08/13/2016      PT End of Session - 08/13/16 1007    Visit Number 4   Number of Visits 25   Date for PT Re-Evaluation 08/30/16   PT Start Time 0905   PT Stop Time 1005   PT Time Calculation (min) 60 min   Equipment Utilized During Treatment Other (comment)  axillary crutches   Activity Tolerance Patient tolerated treatment well;Other (comment)  limited by weakness left LE   Behavior [facility] Therapy [facility] tasks assessed/performed      Past Medical History:  Diagnosis Date  . Hip dysplasia   . Osteoporosis   . Thyroid disease     Past Surgical History:  Procedure Laterality Date  . TOTAL HIP ARTHROPLASTY Right   . TUBAL LIGATION      There were no vitals filed for this visit.      Subjective Assessment - 08/13/16 0906    Subjective Patient reports she is feeling sore in left hip and is described as burning in lateral aspect. Moving leg outward seems to be getting better.    Limitations Sitting;Standing;Walking   Patient Stated Goals to get back to normal in order to be able to perform daily tasks difficulty   Currently in Pain? Yes   Pain Score 4    Pain Location Other (Comment)  left hip and thigh   Pain Orientation Left   Pain Descriptors / Indicators Aching   Pain Type Surgical pain;Acute pain  07/11/2016   Pain Onset 1 to 4 weeks ago  07/11/2016      Objective: Gait: ambulating into clinic with bilateral axillary crutches, PWB left LE, improved  toe out as compared to previous session Strength: significant decrease left hip flexion, knee extension, hip abduction, ER Palpation: + increased tenderness and decreased soft tissue mobility around  incision/lateral aspect of left thigh, improved from previous session  Treatment: Manual therapy: STM: performed gentle, superficial techniques along lateral left hip/thigh along incision: goal: pain, improve soft tissue elasticity Modalities: [place] electrical stimulation: 10/10 cycle; applied (2) electrodes to quadriceps/VMO left LE with patient supine lying with left LE supported on pillow with patient performing quad sets/SAQ each cycle x 15 min.: Goal: neuromuscular education Therapeutic exercises: patient performed exercises with guidance/assistance, verbal and tactile cues and demonstration of PT: Sitting; Roll ball under foot: patient able to raise left LE with assistance, less than previous session, to place foot on ball and able to maintain foot on ball for 10 reps knee flexion/extension with increased effort Hip abduction with resistive band with isometric hold end range x 3-5 seconds x 10 reps Hip adduction with ball with glute sets x 10 repseft LE extended Seated quad sets with l Supine lying:  Assisted hip abduction with knee extended with isometric hold at end range 10 reps and 10 reps through short arc  Quad sets/SAQ x 10 Hook lying; assisted heel slides x 10 reps for controlling hip motion and activating correct muslces   Patient response to treatment: Patient able to contract appropriate muscles with repetition and facilitation of hip abductors, required minimal assistance with hip abduction today indicating improved motor control,   Able to control left hip during knee flexion/extension over ball as compared  to previous session. ambulation with less toe out following treatment and no c/o left knee pain during session today         PT Education - 08/13/16 0912    Education provided Yes   Education Details HEP: working on hip abduction and hip control with ball on floor and walking   Person(s) Educated Patient   Methods Explanation;Demonstration   Comprehension  Verbalized understanding;Returned demonstration;Verbal cues required             PT Long Term Goals - 08/02/16 1217      PT LONG TERM GOAL #1   Title Pt will improve 5x STS to <10 seconds for improved functional mobility within 12 weeks.   Baseline 17.98 sec   Time 12   Period Weeks   Status New     PT LONG TERM GOAL #2   Title Pt will improve TUG to <10 seconds for improved functional mobility within 12 weeks with LRAD.   Baseline 27.01 sec with crutches   Time 12   Period Weeks   Status New     PT LONG TERM GOAL #3   Title Pt will score >1.0 m/s with LRAD on 10 MW test for community mobility and return to PLOF within 12 weeks.   Baseline 0.42 m/s with crutches   Time 12   Period Weeks   Status New     PT LONG TERM GOAL #4   Title Pt will improve LEFS score by 9 points for improved functional mobility within 12 weeks.   Baseline 14/80   Time 12   Period Weeks   Status New               Plan - 08/13/16 1005    Clinical Impression Statement Patient progressing slowly due to significant weakness in left LE due to multiple surgeries. She has demonstrated significant increased motor control in left hip abduction and with abilty to perform controlled motion with ball rolling under left foot. she will benefit from additional physical therapy intervention to achieve goals.    Rehab Potential Good   PT Frequency 2x / week   PT Duration 12 weeks   PT Treatment/Interventions DME Instruction;Gait training;Stair training;Therapeutic activities;Therapeutic exercise;Balance training;Neuromuscular re-education;Patient/family education;Manual techniques;Passive range of motion;Electrical Stimulation   PT Next Visit Plan focus on motor control, strengthening left hip, electrical stimulation russian stim. to left quadriceps   PT Home Exercise Plan sitting hip adduction with glute sets, hip abduction seated with resistive band with isometric holds, roll ball under foot, TrA hook  lying position, quad sets, glute sets      Patient will benefit from skilled therapeutic intervention in order to improve the following deficits and impairments:  Abnormal gait, Decreased knowledge of use of DME, Decreased strength, Difficulty walking, Impaired flexibility, Impaired sensation, Pain, Increased muscle spasms  Visit Diagnosis: Muscle weakness (generalized)  Difficulty in walking, not elsewhere classified     Problem List Patient Active Problem List   Diagnosis Date Noted  . Hypothyroidism 07/17/2015  . Disorder of pelvis 07/17/2015  . OP (osteoporosis) 07/17/2015    Beacher MayBrooks, Lummie Montijo PT 08/13/2016, 10:54 PM  [place] [facility] [address] [place], [place], [address] Phone: [phone]   Fax:  [phone]  Name: Maxcine HamSilvana Bedonie MRN: [id number] Date of Birth: 02/25/1986

## 2016-08-15 ENCOUNTER — Encounter: Payer: Self-pay | Admitting: Physical Therapy

## 2016-08-15 ENCOUNTER — Ambulatory Visit: Payer: Managed Care, Other (non HMO) | Admitting: Physical Therapy

## 2016-08-15 DIAGNOSIS — M6281 Muscle weakness (generalized): Secondary | ICD-10-CM | POA: Diagnosis not present

## 2016-08-15 DIAGNOSIS — R262 Difficulty in walking, not elsewhere classified: Secondary | ICD-10-CM

## 2016-08-16 NOTE — Therapy (Signed)
[facility] PHYSICAL AND SPORTS MEDICINE [address] [place], [place], [address] Phone: [phone]   Fax:  [phone]  Physical Therapy Treatment  Patient Details  Name: Christine HamSilvana Herring MRN: [id number] Date of Birth: 1986/01/08 Referring Provider: Dr. Melvyn NethLewis  Encounter Date: 08/15/2016      PT End of Session - 08/15/16 1029    Visit Number 5   Number of Visits 25   Date for PT Re-Evaluation 08/30/16   PT Start Time 1023   PT Stop Time 1105   PT Time Calculation (min) 42 min   Activity Tolerance Patient tolerated treatment well;Other (comment)   Behavior During Therapy WFL for tasks assessed/performed      Past Medical History:  Diagnosis Date  . Hip dysplasia   . Osteoporosis   . Thyroid disease     Past Surgical History:  Procedure Laterality Date  . TOTAL HIP ARTHROPLASTY Right   . TUBAL LIGATION      There were no vitals filed for this visit.      Subjective Assessment - 08/15/16 1026    Subjective Patient reports increased nerve pain in left LE, pins biting her and feels sore, fatigued; location: mid front of the thigh. She feels she is gaining some strength and is now able to control LE with ball on floor exercises with less difficulty.    Limitations Sitting;Standing;Walking   Patient Stated Goals to get back to normal in order to be able to perform daily tasks difficulty   Currently in Pain? No/denies  no pain, just feels fatigued, knee much better      Objective: Gait: ambulating into clinic with bilateral axillary crutches, PWB left LE, improved control of left LE noted Strength: significant decrease left hip flexion, knee extension (quad setting), hip abduction, ER Palpation: + increased tenderness and decreased soft tissue mobility around incision/lateral aspect of left thigh, improved from previous session  Treatment: Manual therapy: STM: performed gentle, superficial techniques along lateral left hip/thigh  along incision: goal: pain, improve soft tissue elasticity  Modalities: Guern[place]ian electrical stimulation: 10/10 cycle; applied (2) electrodes to quadriceps/VMO left LE with patient supine lying with left LE supported on pillow with patient performing quad sets/SAQ each cycle x 15 min.: Goal: neuromuscular education  Therapeutic exercises: patient performed exercises with guidance/assistance, verbal and tactile cues and demonstration of PT: Sitting; Roll ball under foot: patient able to raise left LE with assistance, less than previous session, to place foot on ball and able to maintain foot on ball for 10 reps knee flexion/extension with increased effort Hip abduction with resistive band with isometric hold end range x 3-5 seconds x 10 reps Hip adduction with ball with glute sets x 10 repseft LE extended Supine lying:  Assisted hip abduction with knee extended with isometric hold at end range 10 reps and 10 reps through short arc  Quad sets/SAQ x 10 Hook lying; assisted heel slides x 10 reps for controlling hip motion and activating correct muslces   Patient response to treatment: Improved muscle control with all exercises with repetition. Required assistance and guidance for all exercises. Improved muscle contraction and motor control left quadriceps with estim.         PT Education - 08/15/16 1028    Education provided Yes   Education Details HEP: continue with hip exercises as tolerated, control leg on ball   Person(s) Educated Patient   Methods Explanation;Demonstration   Comprehension Verbalized understanding;Returned demonstration  PT Long Term Goals - 08/02/16 1217      PT LONG TERM GOAL #1   Title Pt will improve 5x STS to <10 seconds for improved functional mobility within 12 weeks.   Baseline 17.98 sec   Time 12   Period Weeks   Status New     PT LONG TERM GOAL #2   Title Pt will improve TUG to <10 seconds for improved functional mobility within 12  weeks with LRAD.   Baseline 27.01 sec with crutches   Time 12   Period Weeks   Status New     PT LONG TERM GOAL #3   Title Pt will score >1.0 m/s with LRAD on 10 MW test for community mobility and return to PLOF within 12 weeks.   Baseline 0.42 m/s with crutches   Time 12   Period Weeks   Status New     PT LONG TERM GOAL #4   Title Pt will improve LEFS score by 9 points for improved functional mobility within 12 weeks.   Baseline 14/80   Time 12   Period Weeks   Status New               Plan - 08/15/16 1105    Clinical Impression Statement Patient is progressing steadily with good carry over between sessions for strength, improving soft tissue elasticity and abilty to walk with less difficulty. She continues with significant weakness in left hip, quadriceps muscles and requires assistance with all exercises and guidance of therapist for correct alignment and progression and will therefore require continued PT to achieve goals.    Rehab Potential Good   PT Frequency 2x / week   PT Duration 12 weeks   PT Treatment/Interventions DME Instruction;Gait training;Stair training;Therapeutic activities;Therapeutic exercise;Balance training;Neuromuscular re-education;Patient/family education;Manual techniques;Passive range of motion;Electrical Stimulation   PT Next Visit Plan focus on motor control, strengthening left hip, electrical stimulation russian stim. to left quadriceps   PT Home Exercise Plan sitting hip adduction with glute sets, hip abduction seated with resistive band with isometric holds, roll ball under foot, TrA hook lying position, quad sets, glute sets      Patient will benefit from skilled therapeutic intervention in order to improve the following deficits and impairments:  Abnormal gait, Decreased knowledge of use of DME, Decreased strength, Difficulty walking, Impaired flexibility, Impaired sensation, Pain, Increased muscle spasms  Visit Diagnosis: Muscle weakness  (generalized)  Difficulty in walking, not elsewhere classified     Problem List Patient Active Problem List   Diagnosis Date Noted  . Hypothyroidism 07/17/2015  . Disorder of pelvis 07/17/2015  . OP (osteoporosis) 07/17/2015    Beacher May PT 08/16/2016, 9:24 AM  [facility] PHYSICAL AND SPORTS MEDICINE [address], [place], [address] Phone: [phone]   Fax:  [phone]  Name: Christine Herring MRN: [id number] Date of Birth: 1986/01/11

## 2016-08-20 ENCOUNTER — Other Ambulatory Visit: Payer: Self-pay | Admitting: Family Medicine

## 2016-08-20 ENCOUNTER — Encounter: Payer: Self-pay | Admitting: Family Medicine

## 2016-08-20 ENCOUNTER — Ambulatory Visit (INDEPENDENT_AMBULATORY_CARE_PROVIDER_SITE_OTHER): Payer: Managed Care, Other (non HMO) | Admitting: Family Medicine

## 2016-08-20 VITALS — BP 110/51 | HR 75 | Temp 98.4°F | Resp 16 | Ht 64.0 in | Wt 167.6 lb

## 2016-08-20 DIAGNOSIS — Z9660 Presence of unspecified orthopedic joint implant: Secondary | ICD-10-CM | POA: Diagnosis not present

## 2016-08-20 DIAGNOSIS — D649 Anemia, unspecified: Secondary | ICD-10-CM | POA: Diagnosis not present

## 2016-08-20 DIAGNOSIS — R7989 Other specified abnormal findings of blood chemistry: Secondary | ICD-10-CM

## 2016-08-20 DIAGNOSIS — E034 Atrophy of thyroid (acquired): Secondary | ICD-10-CM | POA: Diagnosis not present

## 2016-08-20 DIAGNOSIS — E038 Other specified hypothyroidism: Secondary | ICD-10-CM | POA: Diagnosis not present

## 2016-08-20 DIAGNOSIS — Z96649 Presence of unspecified artificial hip joint: Secondary | ICD-10-CM

## 2016-08-20 LAB — CBC WITH DIFFERENTIAL/PLATELET
BASOS ABS: 0 {cells}/uL (ref 0–200)
BASOS PCT: 0 %
EOS ABS: 132 {cells}/uL (ref 15–500)
Eosinophils Relative: 2 %
HCT: 32.3 % — ABNORMAL LOW (ref 35.0–45.0)
HEMOGLOBIN: 10 g/dL — AB (ref 11.7–15.5)
LYMPHS ABS: 2244 {cells}/uL (ref 850–3900)
Lymphocytes Relative: 34 %
MCH: 23.9 pg — AB (ref 27.0–33.0)
MCHC: 31 g/dL — ABNORMAL LOW (ref 32.0–36.0)
MCV: 77.1 fL — AB (ref 80.0–100.0)
MONO ABS: 462 {cells}/uL (ref 200–950)
MONOS PCT: 7 %
MPV: 9.8 fL (ref 7.5–12.5)
NEUTROS ABS: 3762 {cells}/uL (ref 1500–7800)
Neutrophils Relative %: 57 %
PLATELETS: 293 10*3/uL (ref 140–400)
RBC: 4.19 MIL/uL (ref 3.80–5.10)
RDW: 15.1 % — ABNORMAL HIGH (ref 11.0–15.0)
WBC: 6.6 10*3/uL (ref 3.8–10.8)

## 2016-08-20 MED ORDER — CALCIUM CARB-CHOLECALCIFEROL 500-200 MG-UNIT PO TABS: 2.0000 | ORAL_TABLET | Freq: Every day | ORAL | 11 refills | Status: DC

## 2016-08-20 NOTE — Assessment & Plan Note (Signed)
Recent R hip [facility] for congential hip [facility]. Healing well. Completing PT at this time.

## 2016-08-20 NOTE — Progress Notes (Signed)
Subjective:    Patient ID: Christine Herring, female    DOB: Apr 15, 1986, 30 y.o.   MRN: [id number]  HPI: Christine Herring is a 30 y.o. female presenting on 08/20/2016 for Hypothyroidism   HPI  Pt presents for follow-up of hypothyroidism. Is having changes in her periods. Went 2 mos without a period. Shorter than usual. Feeling tired all the time. Period was very light. LMP 07/15/2016.  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 Was found to have low serum calcium and post-op anemia. She received 2 transfusions while hospitalized.    Past Medical History:  Diagnosis Date  . Hip dysplasia   . Osteoporosis   . Thyroid disease    Past Surgical History:  Procedure Laterality Date  . TOTAL HIP ARTHROPLASTY Right   . TUBAL LIGATION       Current Outpatient Prescriptions on File Prior to Visit  Medication Sig  . levothyroxine (SYNTHROID, LEVOTHROID) 100 MCG tablet TAKE 1 TABLET (100 MCG TOTAL) BY MOUTH DAILY.   No current facility-administered medications on file prior to visit.     Review of Systems  Constitutional: Positive for fatigue. Negative for chills and fever.  HENT: Negative.   Respiratory: Negative for cough, chest tightness and wheezing.   Cardiovascular: Negative for chest pain and leg swelling.  Gastrointestinal: Negative for abdominal pain, constipation, diarrhea, nausea and vomiting.  Endocrine: Negative.  Negative for cold intolerance, heat intolerance, polydipsia, polyphagia and polyuria.  Genitourinary: Negative for difficulty urinating and dysuria.  Musculoskeletal: Positive for arthralgias.       Recent hip replacement.   Neurological: Negative for dizziness, light-headedness and numbness.  Psychiatric/Behavioral: Negative.    Per HPI unless specifically indicated above     Objective:    BP (!) 110/51 (BP Location: Right Arm, Patient Position: Sitting, Cuff Size: Normal)   Pulse 75   Temp 98.4 F (36.9 C) (Oral)   Resp 16   Ht 5\' 4"  (1.626 m)    Wt 167 lb 9.6 oz (76 kg)   LMP 08/20/2016   BMI 28.77 kg/m   Wt Readings from Last 3 Encounters:  08/20/16 167 lb 9.6 oz (76 kg)  05/31/16 165 lb (74.8 kg)  07/17/15 172 lb 9.6 oz (78.3 kg)    Physical Exam  Constitutional: She is oriented to person, place, and time. She appears well-developed and well-nourished.  HENT:  Head: Normocephalic and atraumatic.  Neck: Neck supple.  Cardiovascular: Normal rate, regular rhythm and normal heart sounds.  Exam reveals no gallop and no friction rub.   No murmur heard. Pulmonary/Chest: Effort normal and breath sounds normal. She has no wheezes. She exhibits no tenderness.  Abdominal: Soft. Normal appearance and bowel sounds are normal. She exhibits no distension and no mass. There is no tenderness. There is no rebound and no guarding.  Musculoskeletal: Normal range of motion. She exhibits no edema or tenderness.  Lymphadenopathy:    She has no cervical adenopathy.  Neurological: She is alert and oriented to person, place, and time. Gait (Recent hip surgery. Ambulating with crutches today.) abnormal.  Skin: Skin is warm and dry.   Results for orders placed or performed during the hospital encounter of 05/31/16  Urinalysis complete, with microscopic- may I&O cath if menses  Result Value Ref Range   Color, Urine YELLOW (A) YELLOW   APPearance CLEAR (A) CLEAR   Glucose, UA NEGATIVE NEGATIVE mg/dL   Bilirubin Urine NEGATIVE NEGATIVE   Ketones, ur 1+ (A) NEGATIVE mg/dL   Specific Gravity,  Urine 1.018 1.005 - 1.030   Hgb urine dipstick NEGATIVE NEGATIVE   pH 7.0 5.0 - 8.0   Protein, ur NEGATIVE NEGATIVE mg/dL   Nitrite NEGATIVE NEGATIVE   Leukocytes, UA TRACE (A) NEGATIVE   RBC / HPF 0-5 0 - 5 RBC/hpf   WBC, UA 0-5 0 - 5 WBC/hpf   Bacteria, UA RARE (A) NONE SEEN   Squamous Epithelial / LPF 0-5 (A) NONE SEEN   Mucous PRESENT   CBC with Differential  Result Value Ref Range   WBC 9.0 3.6 - 11.0 K/uL   RBC 4.98 3.80 - 5.20 MIL/uL   Hemoglobin  12.9 12.0 - 16.0 g/dL   HCT 54.038.7 98.135.0 - 19.147.0 %   MCV 77.8 (L) 80.0 - 100.0 fL   MCH 25.9 (L) 26.0 - 34.0 pg   MCHC 33.3 32.0 - 36.0 g/dL   RDW 47.815.3 (H) 29.511.5 - 62.114.5 %   Platelets 255 150 - 440 K/uL   Neutrophils Relative % 70 %   Neutro Abs 6.3 1.4 - 6.5 K/uL   Lymphocytes Relative 23 %   Lymphs Abs 2.0 1.0 - 3.6 K/uL   Monocytes Relative 6 %   Monocytes Absolute 0.5 0.2 - 0.9 K/uL   Eosinophils Relative 1 %   Eosinophils Absolute 0.1 0 - 0.7 K/uL   Basophils Relative 0 %   Basophils Absolute 0.0 0 - 0.1 K/uL  Comprehensive metabolic panel  Result Value Ref Range   Sodium 136 135 - 145 mmol/L   Potassium 3.2 (L) 3.5 - 5.1 mmol/L   Chloride 105 101 - 111 mmol/L   CO2 18 (L) 22 - 32 mmol/L   Glucose, Bld 141 (H) 65 - 99 mg/dL   BUN 12 6 - 20 mg/dL   Creatinine, Ser 3.080.82 0.44 - 1.00 mg/dL   Calcium 9.0 8.9 - 65.710.3 mg/dL   Total Protein 8.2 (H) 6.5 - 8.1 g/dL   Albumin 4.3 3.5 - 5.0 g/dL   AST 28 15 - 41 U/L   ALT 19 14 - 54 U/L   Alkaline Phosphatase 76 38 - 126 U/L   Total Bilirubin 0.8 0.3 - 1.2 mg/dL   GFR calc non Af Amer >60 >60 mL/min   GFR calc Af Amer >60 >60 mL/min   Anion gap 13 5 - 15  Lipase, blood  Result Value Ref Range   Lipase 35 11 - 51 U/L  Pregnancy, urine  Result Value Ref Range   Preg Test, Ur NEGATIVE NEGATIVE      Assessment & Plan:   Problem List Items Addressed This Visit      Endocrine   Hypothyroidism - Primary    Check TSH for dose titration. Menstrual issues may be post-operative/stress related vs. Abnormal thyroid. Will plan to titrate dose to obtain euthyroid as needed.       Relevant Orders   TSH     Other   Disorder of pelvis    Recent R [facility] cong[facility]. Healing well. Completing PT at this time.        Other Visit Diagnoses    Anemia, unspecified anemia type       Post-op anemia. Recheck CBC today 2/2 patient fatigue.    Relevant Orders   CBC with Differential/Platelet    Low serum calcium       Post-op lows- recommend OS-CAL daily to help replete calcium.    Relevant Medications   Calcium Carb-Cholecalciferol 500-200 MG-UNIT  TABS   Other Relevant Orders   COMPLETE METABOLIC PANEL WITH GFR      Meds ordered this encounter  Medications  . Calcium Carb-Cholecalciferol 500-200 MG-UNIT TABS    Sig: Take 2 tablets by mouth daily.    Dispense:  60 tablet    Refill:  11    Order Specific Question:   Supervising Provider    Answer:   Janeann Forehand [phone]      Follow up plan: Return in about 3 months (around 11/20/2016), or if symptoms worsen or fail to improve.

## 2016-08-20 NOTE — Patient Instructions (Addendum)
We will check your thyroid today and adjust dose as necessary.  I also recommend you take a calcium supplement over the counter.

## 2016-08-20 NOTE — Assessment & Plan Note (Signed)
Check TSH for dose titration. Menstrual issues may be post-operative/stress related vs. Abnormal thyroid. Will plan to titrate dose to obtain euthyroid as needed.

## 2016-08-21 ENCOUNTER — Other Ambulatory Visit: Payer: Self-pay | Admitting: Family Medicine

## 2016-08-21 ENCOUNTER — Ambulatory Visit: Payer: Managed Care, Other (non HMO) | Admitting: Physical Therapy

## 2016-08-21 ENCOUNTER — Encounter: Payer: Self-pay | Admitting: Physical Therapy

## 2016-08-21 DIAGNOSIS — R2689 Other abnormalities of gait and mobility: Secondary | ICD-10-CM

## 2016-08-21 DIAGNOSIS — M6281 Muscle weakness (generalized): Secondary | ICD-10-CM | POA: Diagnosis not present

## 2016-08-21 DIAGNOSIS — D509 Iron deficiency anemia, unspecified: Secondary | ICD-10-CM

## 2016-08-21 DIAGNOSIS — R262 Difficulty in walking, not elsewhere classified: Secondary | ICD-10-CM

## 2016-08-21 LAB — COMPLETE METABOLIC PANEL WITH GFR
ALBUMIN: 3.6 g/dL (ref 3.6–5.1)
ALK PHOS: 77 U/L (ref 33–115)
ALT: 11 U/L (ref 6–29)
AST: 14 U/L (ref 10–30)
BUN: 11 mg/dL (ref 7–25)
CHLORIDE: 108 mmol/L (ref 98–110)
CO2: 25 mmol/L (ref 20–31)
Calcium: 9 mg/dL (ref 8.6–10.2)
Creat: 0.7 mg/dL (ref 0.50–1.10)
GFR, Est African American: >89 mL/min (ref >=60)
GLUCOSE: 83 mg/dL (ref 65–99)
POTASSIUM: 4.5 mmol/L (ref 3.5–5.3)
SODIUM: 140 mmol/L (ref 135–146)
Total Bilirubin: 0.5 mg/dL (ref 0.2–1.2)
Total Protein: 6.8 g/dL (ref 6.1–8.1)

## 2016-08-21 LAB — IRON AND TIBC
%SAT: 6 % — AB (ref 11–50)
IRON: 25 ug/dL — AB (ref 40–190)
TIBC: 434 ug/dL (ref 250–450)
UIBC: 409 ug/dL — AB (ref 125–400)

## 2016-08-21 LAB — TSH: TSH: 1.9 mIU/L

## 2016-08-21 LAB — FERRITIN: FERRITIN: 16 ng/mL (ref 10–154)

## 2016-08-21 MED ORDER — FERROUS SULFATE 325 (65 FE) MG PO TABS: 325.0000 mg | ORAL_TABLET | Freq: Every day | ORAL | 3 refills | Status: DC

## 2016-08-21 MED ORDER — LEVOTHYROXINE SODIUM 100 MCG PO TABS: 100.0000 ug | ORAL_TABLET | Freq: Every day | ORAL | 11 refills | Status: DC

## 2016-08-22 NOTE — Therapy (Signed)
[facility] PHYSICAL AND SPORTS MEDICINE [address] [place], [place], [address] Phone: [phone]   Fax:  [phone]  Physical Therapy Treatment  Patient Details  Name: Christine HamSilvana Stotler MRN: [id number] Date of Birth: 1986-02-20 Referring Provider: Dr. Melvyn NethLewis  Encounter Date: 08/21/2016      PT End of Session - 08/21/16 1057    Visit Number 6   Number of Visits 25   Date for PT Re-Evaluation 10/25/16   PT Start Time 1033   PT Stop Time 1113   PT Time Calculation (min) 40 min   Equipment Utilized During Treatment Other (comment)  crutches   Activity Tolerance Patient tolerated treatment well;Other (comment)  limited by soreness in left thigh   [facility] for tasks assessed/performed      Past Medical History:  Diagnosis Date  . Hip dysplasia   . Osteoporosis   . Thyroid disease     Past Surgical History:  Procedure Laterality Date  . TOTAL HIP ARTHROPLASTY Right   . TUBAL LIGATION      There were no vitals filed for this visit.      Subjective Assessment - 08/21/16 1052    Subjective Patient reports she had an incident in her kitchen last Friday 08/16/16 in which she dropped a pan that the handle broke while she was carrying it and she dropped her crutches and place full weight ont her left LE. She has had increased tenderness along quadriceps muslce in front of thigh since. It was worst on Monday and seems to be resolving. She is having sorenss with picking her left leg up to get in/out of car and has increased pain with knee extension (using quadriceps muscle). There was no swelling or bruising in her thigh following incident and she is to follow up with MD on Friday this week for THA left.    Limitations Sitting;Standing;Walking   Patient Stated Goals to get back to normal in order to be able to perform daily tasks difficulty   Currently in Pain? Yes   Pain Score 3    Pain Location Leg  left thigh   Pain  Orientation Left   Pain Descriptors / Indicators Aching   Pain Type Surgical pain;Acute pain   Pain Onset More than a month ago  07/11/2016   Pain Frequency Intermittent     Objective: Gait: ambulating into clinic with bilateral axillary crutches, PWB left LE, improved control of left LE noted Strength: significant decrease left hip flexion, knee extension (quad setting: improved from previous session), hip abduction, ER Palpation: + increased tenderness and decreased soft tissue mobility around incision/lateral aspect of left thigh, improved from previous session; increased spasms with tenderness superior to patella, mid thigh   Treatment: Manual therapy: STM: performed gentle, superficial techniques along lateral left hip/thigh along incision and quadriceps distally: goal: pain, improve soft tissue elasticity  Modalities: [place]ian electrical stimulation: 10/10 cycle; applied (2) electrodes to quadriceps/VMO left LE with patient supine lying with left LE supported on pillow with patient performing quad sets each cycle x 15 min.: Goal: neuromuscular education  Therapeutic exercises: patient performed exercises with guidance/assistance, verbal and tactile cues and demonstration of PT:  Supine lying:  Assisted hip abduction with knee extended  10 reps through short arc  Quad setsx 10 Hook lying; assisted heel slides x 5 reps for controlling hip motion and activating correct muslces   Patient response to treatment: Patient remained with soreness in quadriceps muscle with knee extension  in sitting, demonstrated improved quad control with estim. Modified treatment due to increased quadriceps soreness/pain today         PT Education - 08/21/16 1056    Education provided Yes   Education Details HEP: continue with exercises for strengthening left LE for ROM and quad setting; modify exercises per tolerance   Person(s) Educated Patient   Methods Explanation   Comprehension  Verbalized understanding             PT Long Term Goals - 08/02/16 1217      PT LONG TERM GOAL #1   Title Pt will improve 5x STS to <10 seconds for improved functional mobility within 12 weeks.   Baseline 17.98 sec   Time 12   Period Weeks   Status New     PT LONG TERM GOAL #2   Title Pt will improve TUG to <10 seconds for improved functional mobility within 12 weeks with LRAD.   Baseline 27.01 sec with crutches   Time 12   Period Weeks   Status New     PT LONG TERM GOAL #3   Title Pt will score >1.0 m/s with LRAD on 10 MW test for community mobility and return to PLOF within 12 weeks.   Baseline 0.42 m/s with crutches   Time 12   Period Weeks   Status New     PT LONG TERM GOAL #4   Title Pt will improve LEFS score by 9 points for improved functional mobility within 12 weeks.   Baseline 14/80   Time 12   Period Weeks   Status New               Plan - 08/21/16 1058    Clinical Impression Statement Modified treatment session today due to patient reporting placing increased weight onto left LE when she dropped a pan at home. She is to see MD this Friday for follow up.    Rehab Potential Good   PT Frequency 2x / week   PT Duration 12 weeks   PT Treatment/Interventions DME Instruction;Gait training;Stair training;Therapeutic activities;Therapeutic exercise;Balance training;Neuromuscular re-education;Patient/family education;Manual techniques;Passive range of motion;Electrical Stimulation   PT Next Visit Plan focus on motor control, strengthening left hip, electrical stimulation russian stim. to left quadriceps   PT Home Exercise Plan sitting hip adduction with glute sets, hip abduction seated with resistive band with isometric holds, roll ball under foot, TrA hook lying position, quad sets, glute sets      Patient will benefit from skilled therapeutic intervention in order to improve the following deficits and impairments:  Abnormal gait, Decreased knowledge of  use of DME, Decreased strength, Difficulty walking, Impaired flexibility, Impaired sensation, Pain, Increased muscle spasms  Visit Diagnosis: Muscle weakness (generalized)  Difficulty in walking, not elsewhere classified  Other abnormalities of gait and mobility     Problem List Patient Active Problem List   Diagnosis Date Noted  . Hypothyroidism 07/17/2015  . Disorder of pelvis 07/17/2015  . OP (osteoporosis) 07/17/2015    Beacher May PT 08/22/2016, 9:53 AM  [facility] PHYSICAL AND SPORTS MEDICINE [address], [place], [address] Phone: [phone]   Fax:  [phone]  Name: Christine Herring MRN: [id number] Date of Birth: 1986-01-01

## 2016-08-27 ENCOUNTER — Encounter: Payer: Self-pay | Admitting: Physical Therapy

## 2016-08-27 ENCOUNTER — Ambulatory Visit: Payer: Managed Care, Other (non HMO) | Attending: Orthopedic Surgery | Admitting: Physical Therapy

## 2016-08-27 DIAGNOSIS — M6281 Muscle weakness (generalized): Secondary | ICD-10-CM | POA: Diagnosis present

## 2016-08-27 DIAGNOSIS — R262 Difficulty in walking, not elsewhere classified: Secondary | ICD-10-CM | POA: Insufficient documentation

## 2016-08-27 NOTE — Therapy (Signed)
[place] [facility]ICINE [address] [place], [place], [address] Phone: [phone]   Fax:  [phone]  Physical Therapy Treatment  Patient Details  Name: Maxcine HamSilvana Mckain MRN: [id number] Date of Birth: Jul 19, 1986 Referring Provider: Dr. Melvyn NethLewis  Encounter Date: 08/27/2016      PT End of Session - 08/27/16 1115    Visit Number 7   Number of Visits 25   Date for PT Re-Evaluation 10/25/16   PT Start Time 1033   PT Stop Time 1118   PT Time Calculation (min) 45 min   Equipment Utilized During Treatment Other (comment)  crutches   Activity Tolerance Other (comment);Patient tolerated treatment well  limited by weakness left LE   Behavior During Therapy [facility] for tasks assessed/performed      Past Medical History:  Diagnosis Date  . Hip dysplasia   . Osteoporosis   . Thyroid disease     Past Surgical History:  Procedure Laterality Date  . TOTAL HIP ARTHROPLASTY Right   . TUBAL LIGATION      There were no vitals filed for this visit.      Subjective Assessment - 08/27/16 1034    Subjective Patient reports she is feeling much better from last week. She had follow up with surgeon and is now able to put full weight on left LE as tolerated. She is feeling stronger with exercises.    Limitations Sitting;Standing;Walking   Patient Stated Goals to get back to normal in order to be able to perform daily tasks difficulty   Currently in Pain? Yes   Pain Score 3    Pain Location Other (Comment)  near incision left thigh   Pain Orientation Left   Pain Descriptors / Indicators Aching;Tender   Pain Type Acute pain;Surgical pain   Pain Onset More than a month ago  07/11/2016   Pain Frequency Intermittent      Objective: Gait: ambulating independently with axillary crutches, slow cadence with concentration on stabilizing left LE Strength: significant decrease left hip flexion, knee extension (quad setting: improved from previous  session), hip abduction, ER Palpation: + increased tenderness and decreased soft tissue mobility around incision/lateral aspect of left thigh, improved from previous session   Treatment: Manual therapy: STM: performed gentle, superficial techniques along lateral left hip/thigh along incision and quadriceps distally: goal: pain, improve soft tissue elasticity  Modalities: [place] electrical stimulation: 10/10 cycle; applied (2) electrodes to quadriceps/VMO left LE with patient supine lying with left LE supported on pillow with patient performing quad sets each cycle x 15 min.: Goal: neuromuscular education  Therapeutic exercises: patient performed exercises with guidance/assistance, verbal and tactile cues and demonstration of PT:  Supine lying:  Assisted hip abduction with knee extended 10 reps through short arc with end range resistance given 5 seconds Quad setsx 10 Sitting:  Sit to stand on airex pad with right foot on pad and left foot on floor, balance with chair using left UE 5x sit<>stand 3 sets Standing weight shifting forward and back and side to side on airex pad x 2 min. NuStep x 5 min. With cuing to perform with correct technique, keep knees apart, push through heels  Patient response to treatment: Patient demonstrated improved weight shifting following repetition with sit to stand exercise. She is improving quad control and hip strength with all exercises with guidance and cuing from therapist        PT Education - 08/27/16 1045    Education provided Yes  Education Details HEP: add side to side weight shifting in standing to home program and supine hip abduction with resistive band around thighs   Person(s) Educated Patient   Methods Explanation   Comprehension Verbalized understanding             PT Long Term Goals - 08/02/16 1217      PT LONG TERM GOAL #1   Title Pt will improve 5x STS to <10 seconds for improved functional mobility within 12 weeks.    Baseline 17.98 sec   Time 12   Period Weeks   Status New     PT LONG TERM GOAL #2   Title Pt will improve TUG to <10 seconds for improved functional mobility within 12 weeks with LRAD.   Baseline 27.01 sec with crutches   Time 12   Period Weeks   Status New     PT LONG TERM GOAL #3   Title Pt will score >1.0 m/s with LRAD on 10 MW test for community mobility and return to PLOF within 12 weeks.   Baseline 0.42 m/s with crutches   Time 12   Period Weeks   Status New     PT LONG TERM GOAL #4   Title Pt will improve LEFS score by 9 points for improved functional mobility within 12 weeks.   Baseline 14/80   Time 12   Period Weeks   Status New               Plan - 08/27/16 1120    Clinical Impression Statement Progressing well with increased weight bearing activities. Patient demonstrates good carry over between visits with strength and endurance. She continues with weakness and pain in left LE and will require additional physical therapy intervention to achieve goals and independent ambulation without AD.   Rehab Potential Good   PT Frequency 2x / week   PT Duration 12 weeks   PT Treatment/Interventions DME Instruction;Gait training;Stair training;Therapeutic activities;Therapeutic exercise;Balance training;Neuromuscular re-education;Patient/family education;Manual techniques;Passive range of motion;Electrical Stimulation   PT Next Visit Plan focus on motor control, strengthening left hip, electrical stimulation russian stim. to left quadriceps   PT Home Exercise Plan sitting hip adduction with glute sets, hip abduction seated with resistive band with isometric holds, roll ball under foot, TrA hook lying position, quad sets, glute sets, weight shifting in standing, hip abduction with resistive band supine lying      Patient will benefit from skilled therapeutic intervention in order to improve the following deficits and impairments:  Abnormal gait, Decreased knowledge of use  of DME, Decreased strength, Difficulty walking, Impaired flexibility, Impaired sensation, Pain, Increased muscle spasms  Visit Diagnosis: Muscle weakness (generalized)  Difficulty in walking, not elsewhere classified     Problem List Patient Active Problem List   Diagnosis Date Noted  . Hypothyroidism 07/17/2015  . Disorder of pelvis 07/17/2015  . OP (osteoporosis) 07/17/2015    Beacher May PT 08/27/2016, 4:21 PM  [place] [facility] PHYSICAL AND SPORTS MEDICINE [address], [place], [address] Phone: [phone]   Fax:  [phone]  Name: Shelitha Magley MRN: [id number] Date of Birth: 1986/08/28

## 2016-08-29 ENCOUNTER — Ambulatory Visit: Payer: Managed Care, Other (non HMO) | Admitting: Physical Therapy

## 2016-08-29 DIAGNOSIS — R262 Difficulty in walking, not elsewhere classified: Secondary | ICD-10-CM

## 2016-08-29 DIAGNOSIS — M6281 Muscle weakness (generalized): Secondary | ICD-10-CM

## 2016-08-30 NOTE — Therapy (Signed)
[place] [facility] [address] [place], [address] Phone: [phone]   Fax:  [phone]  Physical Therapy Treatment  Patient Details  Name: Christine Herring MRN: [id number] Date of Birth: Nov 08, 1986 Referring Provider: Dr. Melvyn Neth  Encounter Date: 08/29/2016      PT End of Session - 08/29/16 1147    Visit Number 8   Number of Visits 25   Date for PT Re-Evaluation 10/25/16   PT Start Time 1035   PT Stop Time 1125   PT Time Calculation (min) 50 min   Equipment Utilized During Treatment Other (comment)  crutches   Activity Tolerance Other (comment);Patient tolerated treatment well  LE weakness   Behavior During Therapy [facility] for tasks assessed/performed      Past Medical History:  Diagnosis Date  . Hip dysplasia   . Osteoporosis   . Thyroid disease     Past Surgical History:  Procedure Laterality Date  . TOTAL HIP ARTHROPLASTY Right   . TUBAL LIGATION      There were no vitals filed for this visit.      Subjective Assessment - 08/29/16 1135    Subjective Patient reports she is doing better and able to contract left quadriceps with less effort. She is putting more weight on her left LE with crutches still being used.    Limitations Sitting;Standing;Walking   Patient Stated Goals to get back to normal in order to be able to perform daily tasks difficulty   Currently in Pain? Yes   Pain Score 3    Pain Location Other (Comment)  tender over lateral left thigh over incision   Pain Orientation Left   Pain Descriptors / Indicators Tender   Pain Type Acute pain;Surgical pain   Pain Onset More than a month ago   Pain Frequency Intermittent      Objective: Gait: ambulating independently WBAT LLE  Strength: significant decrease left hip flexion, knee extension (quad setting: improved from previous session), hip abduction, ER  Treatment: Modalities: [place] electrical stimulation: 10/10 cycle; applied  (2) electrodes to quadriceps/VMO left LE with patient supine lying with left LE supported on pillow with patient performing quad sets each cycle x 15 min.: Goal: neuromuscular education  Therapeutic exercises: patient performed exercises with guidance/assistance, verbal and tactile cues and demonstration of PT:  Supine lying:  Assisted hip abduction with knee extended10 reps through short arc with end range resistance given 5 seconds Quad setsx 10 Side lying: Clam x 10 short arc Hip abduction short arc with assistance of therapist x 10 reps  Sitting/standing:  Sit to stand on airex pad with right foot on pad and left foot on floor, balance with chair using left UE 5x sit<>stand 3 sets Standing weight shifting forward and back and side to side on airex pad x 2 min. Standing hip abduction and extension 3 reps each LE with weight shifting controlled NuStep x 5 min. With cuing to perform with correct technique, keep knees apart, push through heels level #2   Patient response to treatment: Patient demonstrated improved weight bearing through left LE and improved strength left hip abduction, required decreased assistance of therapist with repetition       PT Education - 08/29/16 1146    Education provided Yes   Education Details HEP: added standing hip abduction, extension with weight shifting exercises    Person(s) Educated Patient   Methods Explanation;Demonstration;Verbal cues;Handout   Comprehension Verbalized understanding;Returned demonstration;Verbal cues required  PT Long Term Goals - 08/02/16 1217      PT LONG TERM GOAL #1   Title Pt will improve 5x STS to <10 seconds for improved functional mobility within 12 weeks.   Baseline 17.98 sec   Time 12   Period Weeks   Status New     PT LONG TERM GOAL #2   Title Pt will improve TUG to <10 seconds for improved functional mobility within 12 weeks with LRAD.   Baseline 27.01 sec with crutches   Time 12    Period Weeks   Status New     PT LONG TERM GOAL #3   Title Pt will score >1.0 m/s with LRAD on 10 MW test for community mobility and return to PLOF within 12 weeks.   Baseline 0.42 m/s with crutches   Time 12   Period Weeks   Status New     PT LONG TERM GOAL #4   Title Pt will improve LEFS score by 9 points for improved functional mobility within 12 weeks.   Baseline 14/80   Time 12   Period Weeks   Status New               Plan - 08/29/16 1149    Clinical Impression Statement Patient progressing with increased weight bearing activities and advancing exercises with good technique with repetition and cuing. She continues with weakness and pain in left LE and will require additional physical therapy intervention to achieve goals.    Rehab Potential Good   PT Frequency 2x / week   PT Duration 12 weeks   PT Treatment/Interventions DME Instruction;Gait training;Stair training;Therapeutic activities;Therapeutic exercise;Balance training;Neuromuscular re-education;Patient/family education;Manual techniques;Passive range of motion;Electrical Stimulation   PT Next Visit Plan focus on motor control, strengthening left hip, electrical stimulation russian stim. to left quadriceps   PT Home Exercise Plan sitting hip adduction with glute sets, hip abduction seated with resistive band with isometric holds, roll ball under foot, TrA hook lying position, quad sets, glute sets, weight shifting in standing, hip abduction with resistive band supine lying      Patient will benefit from skilled therapeutic intervention in order to improve the following deficits and impairments:  Abnormal gait, Decreased knowledge of use of DME, Decreased strength, Difficulty walking, Impaired flexibility, Impaired sensation, Pain, Increased muscle spasms  Visit Diagnosis: Muscle weakness (generalized)  Difficulty in walking, not elsewhere classified     Problem List Patient Active Problem List   Diagnosis  Date Noted  . Hypothyroidism 07/17/2015  . Disorder of pelvis 07/17/2015  . OP (osteoporosis) 07/17/2015    Beacher MayBrooks, Marie PT 08/30/2016, 5:25 PM  [place] [facility] [address] [place], [place], [address] Phone: [phone]   Fax:  [phone]  Name: Maxcine HamSilvana Stecher MRN: [id number] Date of Birth: May 22, 1986

## 2016-09-02 ENCOUNTER — Encounter: Payer: Self-pay | Admitting: Physical Therapy

## 2016-09-02 ENCOUNTER — Ambulatory Visit: Payer: Managed Care, Other (non HMO) | Admitting: Physical Therapy

## 2016-09-02 DIAGNOSIS — R262 Difficulty in walking, not elsewhere classified: Secondary | ICD-10-CM

## 2016-09-02 DIAGNOSIS — M6281 Muscle weakness (generalized): Secondary | ICD-10-CM | POA: Diagnosis not present

## 2016-09-02 NOTE — Therapy (Signed)
[place] [facility] [address] [place], [place], [address] Phone: [phone]   Fax:  [phone]  Physical Therapy Treatment  Patient Details  Name: Christine HamSilvana Herring MRN: [id number] Date of Birth: [birth year] Referring Provider: Dr. Melvyn NethLewis  Encounter Date: 09/02/2016      PT End of Session - 09/02/16 1856    Visit Number 9   Number of Visits 25   Date for PT Re-Evaluation 10/25/16   PT Start Time 1807   PT Stop Time 1905   PT Time Calculation (min) 58 min   Equipment Utilized During Treatment Other (comment)  crutches   Activity Tolerance Other (comment);Patient tolerated treatment well   Behavior [facility] for tasks assessed/performed      Past Medical History:  Diagnosis Date  . Hip dysplasia   . Osteoporosis   . Thyroid disease     Past Surgical History:  Procedure Laterality Date  . TOTAL HIP ARTHROPLASTY Right   . TUBAL LIGATION      There were no vitals filed for this visit.      Subjective Assessment - 09/02/16 1814    Subjective Patient reports she is improving strength with walking. Still very weak and working on walking with using the right muscles.   Limitations Sitting;Standing;Walking   Patient Stated Goals to get back to normal in order to be able to perform daily tasks difficulty   Currently in Pain? Yes   Pain Score 2    Pain Location Other (Comment)  lateral thigh, hip left leg   Pain Orientation Left   Pain Descriptors / Indicators Tender;Sore   Pain Type Acute pain;Surgical pain   Pain Onset More than a month ago   Pain Frequency Intermittent      Objective: Gait: ambulating independently WBAT LLE using bilateral axillary crutches Strength: significant decrease left hip flexion, knee extension, hip abduction, ER  Treatment: Modalities: [place] electrical stimulation: 10/10 cycle; applied (2) electrodes to quadriceps/VMO left LE with patient supine lying with left LE  supported on pillow with patient performing quad sets each cycle x 20 min.: Goal: neuromuscular education Manual therapy: STM to left LE along incision, scar massage superficial techniques; goal decrease pain, improve scar mobility Therapeutic exercises: patient performed exercises with guidance/assistance, verbal and tactile cues and demonstration of PT:  Side lying:  Assisted hip abduction with end range holding 5 seconds, 5 reps  Sitting/standing:  Sitting hip adduction with ball and glute sets x 10 Hip abduction sitting with red resistive band x 15 reps Sitting knee extension with 3# weight on ankle 2 x 15 reps Knee flexion with red resistive band x 15 reps with assistance of therapist Balance system: weight shift forward, back and side to side x 2 min. Each, limits of stability low level x 2 reps (35 seconds: up to 85% accuracy)  NuStep x 7 min. With cuing to perform with correct technique, keep knees apart, push through heels level #2   Patient response to treatment: Patient demonstrated improved weight shifting following balance exercises, improved ability to perform all exercises with increased control with all exercises with minimal cuing. Patient reported increased soreness in glut medius right hip following most exercises involving activation of muscles        PT Education - 09/02/16 1845    Education provided Yes   Education Details balance system: working on correct weight shifting and distribution of weight   Person(s) Educated Patient   Methods Explanation;Demonstration;Verbal cues  Comprehension Verbalized understanding;Returned demonstration;Verbal cues required             PT Long Term Goals - 08/02/16 1217      PT LONG TERM GOAL #1   Title Pt will improve 5x STS to <10 seconds for improved functional mobility within 12 weeks.   Baseline 17.98 sec   Time 12   Period Weeks   Status New     PT LONG TERM GOAL #2   Title Pt will improve TUG to <10  seconds for improved functional mobility within 12 weeks with LRAD.   Baseline 27.01 sec with crutches   Time 12   Period Weeks   Status New     PT LONG TERM GOAL #3   Title Pt will score >1.0 m/s with LRAD on 10 MW test for community mobility and return to PLOF within 12 weeks.   Baseline 0.42 m/s with crutches   Time 12   Period Weeks   Status New     PT LONG TERM GOAL #4   Title Pt will improve LEFS score by 9 points for improved functional mobility within 12 weeks.   Baseline 14/80   Time 12   Period Weeks   Status New               Plan - 09/02/16 1910    Clinical Impression Statement Patient is progressing with goals and demonstrates improvement with weight bearing and proper weight shifting followiing balance system exercises. She continues with significant weakness that requires additional physical therapy intervention in order to achieve goals.    Rehab Potential Good   PT Frequency 2x / week   PT Duration 12 weeks   PT Treatment/Interventions DME Instruction;Gait training;Stair training;Therapeutic activities;Therapeutic exercise;Balance training;Neuromuscular re-education;Patient/family education;Manual techniques;Passive range of motion;Electrical Stimulation   PT Next Visit Plan focus on motor control, strengthening left hip, electrical stimulation russian stim. to left quadriceps   PT Home Exercise Plan sitting hip adduction with glute sets, hip abduction seated with resistive band with isometric holds, roll ball under foot, TrA hook lying position, quad sets, glute sets, weight shifting in standing, hip abduction with resistive band supine lying      Patient will benefit from skilled therapeutic intervention in order to improve the following deficits and impairments:  Abnormal gait, Decreased knowledge of use of DME, Decreased strength, Difficulty walking, Impaired flexibility, Impaired sensation, Pain, Increased muscle spasms  Visit Diagnosis: Muscle  weakness (generalized)  Difficulty in walking, not elsewhere classified     Problem List Patient Active Problem List   Diagnosis Date Noted  . Hypothyroidism 07/17/2015  . Disorder of pelvis 07/17/2015  . OP (osteoporosis) 07/17/2015    Beacher May PT 09/02/2016, 9:31 PM  [place] [facility] PHYSICAL AND SPORTS MEDICINE [address] [place], [address] Phone: [phone]   Fax:  [phone]  Name: Christine Herring MRN: [id number] Date of Birth: Jun 12, 1986

## 2016-09-03 ENCOUNTER — Encounter: Payer: Managed Care, Other (non HMO) | Admitting: Physical Therapy

## 2016-09-03 ENCOUNTER — Ambulatory Visit: Payer: Managed Care, Other (non HMO) | Admitting: Physical Therapy

## 2016-09-04 ENCOUNTER — Ambulatory Visit: Payer: Managed Care, Other (non HMO) | Admitting: Physical Therapy

## 2016-09-04 ENCOUNTER — Encounter: Payer: Self-pay | Admitting: Physical Therapy

## 2016-09-04 DIAGNOSIS — M6281 Muscle weakness (generalized): Secondary | ICD-10-CM | POA: Diagnosis not present

## 2016-09-04 DIAGNOSIS — R262 Difficulty in walking, not elsewhere classified: Secondary | ICD-10-CM

## 2016-09-04 NOTE — Therapy (Signed)
[place] [facility] [address] [place], [place], [address] Phone: [phone]   Fax:  [phone]  Physical Therapy Treatment  Patient Details  Name: Christine HamSilvana Herring MRN: [id number] Date of Birth: Oct 29, 1986 Referring Provider: Dr. Melvyn NethLewis  Encounter Date: 09/04/2016      PT End of Session - 09/04/16 1535    Visit Number 10   Number of Visits 25   Date for PT Re-Evaluation 10/25/16   PT Start Time 1433   PT Stop Time 1532   PT Time Calculation (min) 59 min   Activity Tolerance Patient tolerated treatment well   Behavior During Therapy [facility] for tasks assessed/performed      Past Medical History:  Diagnosis Date  . Hip dysplasia   . Osteoporosis   . Thyroid disease     Past Surgical History:  Procedure Laterality Date  . TOTAL HIP ARTHROPLASTY Right   . TUBAL LIGATION      There were no vitals filed for this visit.      Subjective Assessment - 09/04/16 1448    Subjective patient reports increased soreness in right hip s/p treatment session.    Limitations Sitting;Standing;Walking   Patient Stated Goals to get back to normal in order to be able to perform daily tasks difficulty   Currently in Pain? No/denies         Objective: Gait: ambulating independently WBAT LLE using bilateral axillary crutches   Treatment: Modalities: [place]ian electrical stimulation: 10/10 cycle; applied (2) electrodes to quadriceps/VMO left LE with patient supine lying with left LE supported on pillow with patient performing quad sets each cycle x 20 min.: Goal: neuromuscular education Therapeutic exercises: patient performed exercises with guidance/assistance, verbal and tactile cues and demonstration of PT:  Side lying:  Assisted hip abduction with end range holding 5 seconds, 5 reps  Sitting/standing: Sitting hip adduction with ball and glute sets x 10 Hip abduction sitting with red resistive band x 15 reps Sitting  knee extension with 3# weight on ankle 2 x 15 reps Knee flexion with red resistive band x 15 reps with assistance of therapist Balance system: weight shift forward, back and side to side x 1 min. Each, limits of stability low level x 2 reps with platform unlocked #10 (~35 seconds: up to 90% accuracy)  NuStep x 5 min. With cuing to perform with correct technique, keep knees apart, push through heels level #2   Patient response to treatment: Patient demonstrated improved weight bearing through mid foot, decreased weight bearing onto heels with balance system exercises. She demonstrated good technique with all exercises with minimal cuing. still unable to perform SLR due to significant weakness and using crutches for stability when walking. Minimal soreness reported in lateral hip muscles following exercises today       PT Long Term Goals - 08/02/16 1217      PT LONG TERM GOAL #1   Title Pt will improve 5x STS to <10 seconds for improved functional mobility within 12 weeks.   Baseline 17.98 sec   Time 12   Period Weeks   Status New     PT LONG TERM GOAL #2   Title Pt will improve TUG to <10 seconds for improved functional mobility within 12 weeks with LRAD.   Baseline 27.01 sec with crutches   Time 12   Period Weeks   Status New     PT LONG TERM GOAL #3   Title Pt will score >1.0 m/s with  LRAD on 10 MW test for community mobility and return to PLOF within 12 weeks.   Baseline 0.42 m/s with crutches   Time 12   Period Weeks   Status New     PT LONG TERM GOAL #4   Title Pt will improve LEFS score by 9 points for improved functional mobility within 12 weeks.   Baseline 14/80   Time 12   Period Weeks   Status New               Plan - 09/04/16 1543    Clinical Impression Statement Patient progressing well with strength and ability to walk with less difficulty and increased weight bearing through left LE. She continues wtih weakness in left LE hip and LE musculature and  will require additional physical therapy intervention to achieve maximal function with walking.    Rehab Potential Good   PT Frequency 2x / week   PT Duration 12 weeks   PT Treatment/Interventions DME Instruction;Gait training;Stair training;Therapeutic activities;Therapeutic exercise;Balance training;Neuromuscular re-education;Patient/family education;Manual techniques;Passive range of motion;Electrical Stimulation   PT Next Visit Plan focus on motor control, strengthening left hip, electrical stimulation russian stim. to left quadriceps   PT Home Exercise Plan sitting hip adduction with glute sets, hip abduction seated with resistive band with isometric holds, roll ball under foot, TrA hook lying position, quad sets, glute sets, weight shifting in standing, hip abduction with resistive band supine lying      Patient will benefit from skilled therapeutic intervention in order to improve the following deficits and impairments:  Abnormal gait, Decreased knowledge of use of DME, Decreased strength, Difficulty walking, Impaired flexibility, Impaired sensation, Pain, Increased muscle spasms  Visit Diagnosis: Muscle weakness (generalized)  Difficulty in walking, not elsewhere classified     Problem List Patient Active Problem List   Diagnosis Date Noted  . Hypothyroidism 07/17/2015  . Disorder of pelvis 07/17/2015  . OP (osteoporosis) 07/17/2015    Beacher May PT 09/05/2016, 1:03 PM  [place] [facility] PHYSICAL AND SPORTS MEDICINE [address] [place], [address] Phone: [phone]   Fax:  [phone]  Name: Christine Herring MRN: [id number] Date of Birth: 26-Jul-1986

## 2016-09-05 ENCOUNTER — Encounter: Payer: Managed Care, Other (non HMO) | Admitting: Physical Therapy

## 2016-09-10 ENCOUNTER — Encounter: Payer: Self-pay | Admitting: Physical Therapy

## 2016-09-10 ENCOUNTER — Encounter: Payer: Managed Care, Other (non HMO) | Admitting: Physical Therapy

## 2016-09-10 ENCOUNTER — Ambulatory Visit: Payer: Managed Care, Other (non HMO) | Admitting: Physical Therapy

## 2016-09-10 DIAGNOSIS — M6281 Muscle weakness (generalized): Secondary | ICD-10-CM

## 2016-09-10 DIAGNOSIS — R262 Difficulty in walking, not elsewhere classified: Secondary | ICD-10-CM

## 2016-09-10 NOTE — Therapy (Signed)
[place] [facility] [address] [place], [place], [address] Phone: [phone]   Fax:  [phone]  Physical Therapy Treatment  Patient Details  Name: Maxcine HamSilvana Heffley MRN: [id number] Date of Birth: [birth year] Referring Provider: Dr. Melvyn NethLewis  Encounter Date: 09/10/2016      PT End of Session - 09/10/16 1506    Visit Number 11   Number of Visits 25   Date for PT Re-Evaluation 10/25/16   PT Start Time 1442   PT Stop Time 1533   PT Time Calculation (min) 51 min   Equipment Utilized During Treatment Other (comment)  axillary crutches   Activity Tolerance Patient tolerated treatment well   Behavior During Therapy [facility] for tasks assessed/performed      Past Medical History:  Diagnosis Date  . Hip dysplasia   . Osteoporosis   . Thyroid disease     Past Surgical History:  Procedure Laterality Date  . TOTAL HIP ARTHROPLASTY Right   . TUBAL LIGATION      There were no vitals filed for this visit.      Subjective Assessment - 09/10/16 1453    Subjective Patient reports she has been busy today and feels tired today. She reports she has more control over her left hip muscle (abductors). She reports overall she is improving strength left LE.    Limitations Sitting;Standing;Walking   Patient Stated Goals to get back to normal in order to be able to perform daily tasks difficulty   Currently in Pain? No/denies      Objective: Gait; ambulating WBAT LLE using bilateral axillary crutches Strength; left LE decreased hip abduction, flexion, ER, knee extension, flexion  Treatment: Modalities: [place]ian electrical stimulation: 10/10 cycle; applied (2) electrodes to quadriceps/VMO left LE with patient supine lying with left LE supported on pillow with patient performing quad sets each cycle x 20min.: Goal: neuromuscular education Therapeutic exercises: patient performed exercises with guidance/assistance, verbal and tactile cues  and demonstration of PT: Sitting/standing: Sit to stand off chair with airex pad in chair with ball between knees and glute sets 3 sets x 5 reps Side stepping along airex balance beam x 2 min  Sitting hip adduction with glute sets with ball x 15 reps Weight shifting in place forward/back and side to side x 2 min. Concentration on WB through mid foot  NuStep x 10min. With cuing to perform with correct technique, keep knees apart, push through heels level #3   Patient response to treatment:Patient reported fatigue in lateral hip musculature and glutes with exercises. She was able to perform all exercises with good technique following demonstration and with verbal cues. Improved quad control, activation with estim.      PT Education - 09/10/16 1505    Education provided Yes   Education Details HEP: continue with strengthening and balance exercises for strengthening   Person(s) Educated Patient   Methods Explanation;Demonstration;Verbal cues   Comprehension Verbalized understanding;Returned demonstration;Verbal cues required             PT Long Term Goals - 08/02/16 1217      PT LONG TERM GOAL #1   Title Pt will improve 5x STS to <10 seconds for improved functional mobility within 12 weeks.   Baseline 17.98 sec   Time 12   Period Weeks   Status New     PT LONG TERM GOAL #2   Title Pt will improve TUG to <10 seconds for improved functional mobility within 12 weeks with LRAD.  Baseline 27.01 sec with crutches   Time 12   Period Weeks   Status New     PT LONG TERM GOAL #3   Title Pt will score >1.0 m/s with LRAD on 10 MW test for community mobility and return to PLOF within 12 weeks.   Baseline 0.42 m/s with crutches   Time 12   Period Weeks   Status New     PT LONG TERM GOAL #4   Title Pt will improve LEFS score by 9 points for improved functional mobility within 12 weeks.   Baseline 14/80   Time 12   Period Weeks   Status New               Plan -  09/10/16 1507    Clinical Impression Statement progressing well with strength in left LE. continues with weakness and decreased balance for walking uneven surfaces. Continues to benefit from physical therapy intervention to address limitations and improve ambulation with goal of no AD.   Rehab Potential Good   PT Frequency 2x / week   PT Duration 12 weeks   PT Treatment/Interventions DME Instruction;Gait training;Stair training;Therapeutic activities;Therapeutic exercise;Balance training;Neuromuscular re-education;Patient/family education;Manual techniques;Passive range of motion;Electrical Stimulation   PT Next Visit Plan focus on motor control, strengthening left hip, electrical stimulation russian stim. to left quadriceps   PT Home Exercise Plan sitting hip adduction with glute sets, hip abduction seated with resistive band with isometric holds, roll ball under foot, TrA hook lying position, quad sets, glute sets, weight shifting in standing, hip abduction with resistive band supine lying      Patient will benefit from skilled therapeutic intervention in order to improve the following deficits and impairments:  Abnormal gait, Decreased knowledge of use of DME, Decreased strength, Difficulty walking, Impaired flexibility, Impaired sensation, Pain, Increased muscle spasms  Visit Diagnosis: Muscle weakness (generalized)  Difficulty in walking, not elsewhere classified     Problem List Patient Active Problem List   Diagnosis Date Noted  . Hypothyroidism 07/17/2015  . Disorder of pelvis 07/17/2015  . OP (osteoporosis) 07/17/2015    Beacher May PT 09/11/2016, 7:54 AM  [place] [facility] PHYSICAL AND SPORTS MEDICINE [address], [place], [address] Phone: [phone]   Fax:  [phone]  Name: Klani Caridi MRN: [id number] Date of Birth: April 11, 1986

## 2016-09-12 ENCOUNTER — Encounter: Payer: Self-pay | Admitting: Physical Therapy

## 2016-09-12 ENCOUNTER — Encounter: Payer: Managed Care, Other (non HMO) | Admitting: Physical Therapy

## 2016-09-12 ENCOUNTER — Ambulatory Visit: Payer: Managed Care, Other (non HMO) | Admitting: Physical Therapy

## 2016-09-12 DIAGNOSIS — R262 Difficulty in walking, not elsewhere classified: Secondary | ICD-10-CM

## 2016-09-12 DIAGNOSIS — M6281 Muscle weakness (generalized): Secondary | ICD-10-CM

## 2016-09-13 NOTE — Therapy (Signed)
[place] [facility] PH[facility]ORTS MEDICINE [address], [place], [address] Phone: [phone]   Fax:  [phone]  Physical Therapy Treatment  Patient Details  Name: Christine Herring MRN: [id number] Date of Birth: 19-Apr-1986 Referring Provider: Dr. Melvyn Neth  Encounter Date: 09/12/2016      PT End of Session - 09/12/16 1632    Visit Number 12   Number of Visits 25   Date for PT Re-Evaluation 10/25/16   PT Start Time 1535   PT Stop Time 1630   PT Time Calculation (min) 55 min   Equipment Utilized During Treatment Other (comment)  crutches   Activity Tolerance Patient tolerated treatment well   Behavior During Therapy [facility] for tasks assessed/performed      Past Medical History:  Diagnosis Date  . Hip dysplasia   . Osteoporosis   . Thyroid disease     Past Surgical History:  Procedure Laterality Date  . TOTAL HIP ARTHROPLASTY Right   . TUBAL LIGATION      There were no vitals filed for this visit.      Subjective Assessment - 09/12/16 1556    Subjective Patient reports she is getting better with transfers in and out of car. She is less sore in hip with all exercises and is still very weak   Limitations Sitting;Standing;Walking   Patient Stated Goals to get back to normal in order to be able to perform daily tasks difficulty   Currently in Pain? No/denies      Objective; Gait; without crutches (+) hiking left hip, decreased hip/knee flexion, decreased weight shift to left Strength; decreased left hip abduction 3-/5, hip flexion 3-/5, knee extension 4-/5 Palpation; + spasms along QL left side   Treatment: Modalities: [place] electrical stimulation: 10/10 cycle; applied (2) electrodes to quadriceps/VMO left LE and high volt estim along lateral aspect thigh/scar (2) electrodes: intensity to comfort: with patient supine lying with left LE supported on pillow with patient performing quad sets each cycle x .: Goal:  neuromuscular education; pain control  Therapeutic exercises: patient performed exercises with guidance/assistance, verbal and tactile cues and demonstration of PT:  Sitting/standing: Sitting hip adduction with ball and glute sets x 10 Hip abduction sitting with red resistive band x 15 reps Sitting knee extension with 3# weight on ankle 2 x 15 reps Knee flexion with red resistive band x 15 reps with assistance of therapist Side stepping along airex balance beam x 2 min. Standing hip abduction 3 x 3 each LE with focus on hip flexion isolation left LE Gait activity with focus on isolating hip flexion   NuStep x . With cuing to perform with correct technique, keep knees apart, push through heels level #3   Patient response to treatment: Patient demonstrated increased effort to perform isolated hip flexion indicating decreased strength, motor control; improved with repetition. Patient required demonstration and moderate VC for exercises in standing. Improving strength and motor control and endurance throughout session, fatigue in hip muscles noted with fasciculations occurring during exercises for hip abduction and weight shifting exercises        PT Education - 09/12/16 1557    Education provided Yes   Education Details HEP: re assessed strengthening exercises for left LE/hip   Person(s) Educated Patient   Methods Explanation;Demonstration;Verbal cues   Comprehension Verbalized understanding;Returned demonstration;Verbal cues required             PT Long Term Goals - 08/02/16 1217      PT LONG  TERM GOAL #1   Title Pt will improve 5x STS to <10 seconds for improved functional mobility within 12 weeks.   Baseline 17.98 sec   Time 12   Period Weeks   Status New     PT LONG TERM GOAL #2   Title Pt will improve TUG to <10 seconds for improved functional mobility within 12 weeks with LRAD.   Baseline 27.01 sec with crutches   Time 12   Period Weeks   Status New      PT LONG TERM GOAL #3   Title Pt will score >1.0 m/s with LRAD on 10 MW test for community mobility and return to PLOF within 12 weeks.   Baseline 0.42 m/s with crutches   Time 12   Period Weeks   Status New     PT LONG TERM GOAL #4   Title Pt will improve LEFS score by 9 points for improved functional mobility within 12 weeks.   Baseline 14/80   Time 12   Period Weeks   Status New               Plan - 09/12/16 1630    Clinical Impression Statement Patient is progressing well with improving strength and functional use left LE and is able to transfer in/out of car with less difficulty. She continues with significant weakness, decreased endurance and motor control and requires guidance and assistance to perform exercises that will regain proper motor patterns and strength to achieve goals.    Rehab Potential Good   PT Frequency 2x / week   PT Duration 12 weeks   PT Treatment/Interventions DME Instruction;Gait training;Stair training;Therapeutic activities;Therapeutic exercise;Balance training;Neuromuscular re-education;Patient/family education;Manual techniques;Passive range of motion;Electrical Stimulation   PT Next Visit Plan focus on motor control, strengthening left hip, electrical stimulation russian stim. to left quadriceps   PT Home Exercise Plan continue with exercises as instructed and work on control of hip musculature for flexion, abduction      Patient will benefit from skilled therapeutic intervention in order to improve the following deficits and impairments:  Abnormal gait, Decreased knowledge of use of DME, Decreased strength, Difficulty walking, Impaired flexibility, Impaired sensation, Pain, Increased muscle spasms  Visit Diagnosis: Muscle weakness (generalized)  Difficulty in walking, not elsewhere classified     Problem List Patient Active Problem List   Diagnosis Date Noted  . Hypothyroidism 07/17/2015  . Disorder of pelvis 07/17/2015  . OP  (osteoporosis) 07/17/2015    Beacher MayBrooks, Marie PT 09/13/2016, 1:00 PM  [place] [facility] [address] [place], [place], [address] Phone: [phone]   Fax:  [phone]  Name: Christine Herring MRN: [id number] Date of Birth: [birth year]

## 2016-09-16 ENCOUNTER — Ambulatory Visit: Payer: Managed Care, Other (non HMO) | Admitting: Physical Therapy

## 2016-09-16 ENCOUNTER — Encounter: Payer: Self-pay | Admitting: Physical Therapy

## 2016-09-16 DIAGNOSIS — M6281 Muscle weakness (generalized): Secondary | ICD-10-CM

## 2016-09-16 DIAGNOSIS — R262 Difficulty in walking, not elsewhere classified: Secondary | ICD-10-CM

## 2016-09-16 NOTE — Therapy (Signed)
[place] [facility]INE [address] [place], [place], [address] Phone: [phone]   Fax:  [phone]  Physical Therapy Treatment  Patient Details  Name: Maxcine HamSilvana Hollern MRN: [id number] Date of Birth: [birth year] Referring Provider: Dr. Melvyn NethLewis  Encounter Date: 09/16/2016      PT End of Session - 09/16/16 1917    Visit Number 13   Number of Visits 25   Date for PT Re-Evaluation 10/25/16   PT Start Time 1850   PT Stop Time 1946   PT Time Calculation (min) 56 min   Activity Tolerance Patient tolerated treatment well   Behavior During Therapy [facility] for tasks assessed/performed      Past Medical History:  Diagnosis Date  . Hip dysplasia   . Osteoporosis   . Thyroid disease     Past Surgical History:  Procedure Laterality Date  . TOTAL HIP ARTHROPLASTY Right   . TUBAL LIGATION      There were no vitals filed for this visit.      Subjective Assessment - 09/16/16 1911    Subjective left knee hurting today and she is working and standing and walking on it more since back to work and this may be causing increased soreness. Overall improving ability to walk with increased WB left LE.   Limitations Sitting;Standing;Walking   Patient Stated Goals to get back to normal in order to be able to perform daily tasks difficulty   Currently in Pain? Yes   Pain Score 2    Pain Location Knee   Pain Orientation Left   Pain Descriptors / Indicators Tender;Sore   Pain Type Acute pain;Surgical pain   Pain Onset More than a month ago      Objective; Gait; without crutches (+) hiking left hip, decreased hip/knee flexion, decreased weight shift to left   Palpation; + spasms along QL left side   Treatment: Modalities: [place]ian electrical stimulation: 10/10 cycle; applied (2) electrodes to quadriceps/VMO left LE and high volt estim along medial and lateral aspect of left knee with moist heat applied to same  (2) electrodes: intensity  to comfort: with patient supine lying with left LE supported on pillow with patient performing quad sets each cycle x 20min.: Goal: neuromuscular education; pain control  Therapeutic exercises: patient performed exercises with guidance/assistance, verbal and tactile cues and demonstration of PT:  Sitting/standing: Sitting hip adduction with ball and glute sets x 10 Hip abduction sitting with red resistive band x 15 reps Sitting knee extension with 3# weight on ankle 2 x 15 reps Knee flexion with red resistive band x 15 reps with assistance of therapist Side stepping along airex balance beam x 2 min. Instructed in walking with one crutch with proper sequencing and weight shifting to left LE  NuStep x 8min. Independent: unbilled:  level #3   Patient response to treatment:Patient with decreased pain in left knee following estim/moist heat and able to complete exercises with improved control and strength left LE. Improved gait pattern with repetition.         PT Education - 09/16/16 1914    Education provided Yes   Education Details HEP and use of heat vs ice for pain control/soreness    Person(s) Educated Patient   Methods Explanation;Demonstration;Verbal cues   Comprehension Verbalized understanding;Returned demonstration;Verbal cues required             PT Long Term Goals - 08/02/16 1217      PT LONG TERM GOAL #1  Title Pt will improve 5x STS to <10 seconds for improved functional mobility within 12 weeks.   Baseline 17.98 sec   Time 12   Period Weeks   Status New     PT LONG TERM GOAL #2   Title Pt will improve TUG to <10 seconds for improved functional mobility within 12 weeks with LRAD.   Baseline 27.01 sec with crutches   Time 12   Period Weeks   Status New     PT LONG TERM GOAL #3   Title Pt will score >1.0 m/s with LRAD on 10 MW test for community mobility and return to PLOF within 12 weeks.   Baseline 0.42 m/s with crutches   Time 12   Period  Weeks   Status New     PT LONG TERM GOAL #4   Title Pt will improve LEFS score by 9 points for improved functional mobility within 12 weeks.   Baseline 14/80   Time 12   Period Weeks   Status New               Plan - 09/16/16 1918    Clinical Impression Statement Patient is progressing well with strength and functional use left LE for standing and walking She is working on walking without crutches and one crutch with increasing weight shifting to left LE. She continues with decreased strength in left LE which limits function with walking and she will benefit from continues physical therapy intervention to achieve goals.    Rehab Potential Good   PT Frequency 2x / week   PT Duration 12 weeks   PT Treatment/Interventions DME Instruction;Gait training;Stair training;Therapeutic activities;Therapeutic exercise;Balance training;Neuromuscular re-education;Patient/family education;Manual techniques;Passive range of motion;Electrical Stimulation   PT Next Visit Plan focus on motor control, strengthening left hip, electrical stimulation russian stim. to left quadriceps   PT Home Exercise Plan continue with exercises as instructed and work on control of hip musculature for flexion, abduction      Patient will benefit from skilled therapeutic intervention in order to improve the following deficits and impairments:  Abnormal gait, Decreased knowledge of use of DME, Decreased strength, Difficulty walking, Impaired flexibility, Impaired sensation, Pain, Increased muscle spasms  Visit Diagnosis: Muscle weakness (generalized)  Difficulty in walking, not elsewhere classified     Problem List Patient Active Problem List   Diagnosis Date Noted  . Hypothyroidism 07/17/2015  . Disorder of pelvis 07/17/2015  . OP (osteoporosis) 07/17/2015    Beacher May PT 09/16/2016, 7:49 PM  [place] [facility] PHYSICAL AND SPORTS MEDICINE [address], [place],  [address] Phone: [phone]   Fax:  [phone]  Name: Philisha Weinel MRN: [id number] Date of Birth: 1986/12/08

## 2016-09-17 ENCOUNTER — Encounter: Payer: Managed Care, Other (non HMO) | Admitting: Physical Therapy

## 2016-09-17 ENCOUNTER — Encounter: Payer: Self-pay | Admitting: Family Medicine

## 2016-09-17 ENCOUNTER — Other Ambulatory Visit: Payer: Self-pay | Admitting: Family Medicine

## 2016-09-17 ENCOUNTER — Ambulatory Visit (INDEPENDENT_AMBULATORY_CARE_PROVIDER_SITE_OTHER): Payer: Managed Care, Other (non HMO) | Admitting: Family Medicine

## 2016-09-17 VITALS — BP 107/64 | HR 69 | Temp 99.1°F | Resp 16 | Ht 64.0 in | Wt 165.0 lb

## 2016-09-17 DIAGNOSIS — Z124 Encounter for screening for malignant neoplasm of cervix: Secondary | ICD-10-CM

## 2016-09-17 DIAGNOSIS — D509 Iron deficiency anemia, unspecified: Secondary | ICD-10-CM

## 2016-09-17 DIAGNOSIS — Z23 Encounter for immunization: Secondary | ICD-10-CM

## 2016-09-17 DIAGNOSIS — Z01419 Encounter for gynecological examination (general) (routine) without abnormal findings: Secondary | ICD-10-CM

## 2016-09-17 DIAGNOSIS — R8290 Unspecified abnormal findings in urine: Secondary | ICD-10-CM

## 2016-09-17 LAB — CBC WITH DIFFERENTIAL/PLATELET
BASOS ABS: 0 {cells}/uL (ref 0–200)
Basophils Relative: 0 %
EOS ABS: 154 {cells}/uL (ref 15–500)
Eosinophils Relative: 2 %
HCT: 35.5 % (ref 35.0–45.0)
Hemoglobin: 11.3 g/dL — ABNORMAL LOW (ref 11.7–15.5)
Lymphocytes Relative: 34 %
Lymphs Abs: 2618 cells/uL (ref 850–3900)
MCH: 23.7 pg — AB (ref 27.0–33.0)
MCHC: 31.8 g/dL — AB (ref 32.0–36.0)
MCV: 74.6 fL — AB (ref 80.0–100.0)
MONOS PCT: 5 %
MPV: 10.1 fL (ref 7.5–12.5)
Monocytes Absolute: 385 cells/uL (ref 200–950)
NEUTROS ABS: 4543 {cells}/uL (ref 1500–7800)
NEUTROS PCT: 59 %
PLATELETS: 311 10*3/uL (ref 140–400)
RBC: 4.76 MIL/uL (ref 3.80–5.10)
RDW: 16.1 % — ABNORMAL HIGH (ref 11.0–15.0)
WBC: 7.7 10*3/uL (ref 3.8–10.8)

## 2016-09-17 LAB — RETICULOCYTES
ABS Retic: 38080 cells/uL (ref 20000–80000)
RBC.: 4.76 MIL/uL (ref 3.80–5.10)
Retic Ct Pct: 0.8 %

## 2016-09-17 LAB — POCT URINALYSIS DIPSTICK
Bilirubin, UA: NEGATIVE
GLUCOSE UA: NEGATIVE
Ketones, UA: NEGATIVE
Leukocytes, UA: NEGATIVE
NITRITE UA: POSITIVE
PH UA: 5
Protein, UA: NEGATIVE
Spec Grav, UA: 1.02
UROBILINOGEN UA: 0.2

## 2016-09-17 MED ORDER — NITROFURANTOIN MONOHYD MACRO 100 MG PO CAPS: 100.0000 mg | ORAL_CAPSULE | Freq: Two times a day (BID) | ORAL | 0 refills | Status: DC

## 2016-09-17 NOTE — Patient Instructions (Addendum)
You are being treated for a urinary tract infection today. Please take your antibiotic as directed. If you develop severe flank pain, blood in the urine, fever, nausea or vomiting, please seek immediate medical attention in the ER.    Health Maintenance, Female Adopting a healthy lifestyle and getting preventive care can go a long way to promote health and wellness. Talk with your health care provider about what schedule of regular examinations is right for you. This is a good chance for you to check in with your provider about disease prevention and staying healthy. In between checkups, there are plenty of things you can do on your own. Experts have done a lot of research about which lifestyle changes and preventive measures are most likely to keep you healthy. Ask your health care provider for more information. WEIGHT AND DIET  Eat a healthy diet  Be sure to include plenty of vegetables, fruits, low-fat dairy products, and lean protein.  Do not eat a lot of foods high in solid fats, added sugars, or salt.  Get regular exercise. This is one of the most important things you can do for your health.  Most adults should exercise for at least 150 minutes each week. The exercise should increase your heart rate and make you sweat (moderate-intensity exercise).  Most adults should also do strengthening exercises at least twice a week. This is in addition to the moderate-intensity exercise.  Maintain a healthy weight  Body mass index (BMI) is a measurement that can be used to identify possible weight problems. It estimates body fat based on height and weight. Your health care provider can help determine your BMI and help you achieve or maintain a healthy weight.  For females 43 years of age and older:   A BMI below 18.5 is considered underweight.  A BMI of 18.5 to 24.9 is normal.  A BMI of 25 to 29.9 is considered overweight.  A BMI of 30 and above is considered obese.  Watch levels of  cholesterol and blood lipids  You should start having your blood tested for lipids and cholesterol at 28 years of age, then have this test every 5 years.  You may need to have your cholesterol levels checked more often if:  Your lipid or cholesterol levels are high.  You are older than 28 years of age.  You are at high risk for heart disease.  CANCER SCREENING   Lung Cancer  Lung cancer screening is recommended for adults 49-45 years old who are at high risk for lung cancer because of a history of smoking.  A yearly low-dose CT scan of the lungs is recommended for people who:  Currently smoke.  Have quit within the past 15 years.  Have at least a 30-pack-year history of smoking. A pack year is smoking an average of one pack of cigarettes a day for 1 year.  Yearly screening should continue until it has been 15 years since you quit.  Yearly screening should stop if you develop a health problem that would prevent you from having lung cancer treatment.  Breast Cancer  Practice breast self-awareness. This means understanding how your breasts normally appear and feel.  It also means doing regular breast self-exams. Let your health care provider know about any changes, no matter how small.  If you are in your 20s or 30s, you should have a clinical breast exam (CBE) by a health care provider every 1-3 years as part of a regular health exam.  If  you are 40 or older, have a CBE every year. Also consider having a breast X-ray (mammogram) every year.  If you have a family history of breast cancer, talk to your health care provider about genetic screening.  If you are at high risk for breast cancer, talk to your health care provider about having an MRI and a mammogram every year.  Breast cancer gene (BRCA) assessment is recommended for women who have family members with BRCA-related cancers. BRCA-related cancers include:  Breast.  Ovarian.  Tubal.  Peritoneal  cancers.  Results of the assessment will determine the need for genetic counseling and BRCA1 and BRCA2 testing. Cervical Cancer Your health care provider may recommend that you be screened regularly for cancer of the pelvic organs (ovaries, uterus, and vagina). This screening involves a pelvic examination, including checking for microscopic changes to the surface of your cervix (Pap test). You may be encouraged to have this screening done every 3 years, beginning at age 34.  For women ages 35-65, health care providers may recommend pelvic exams and Pap testing every 3 years, or they may recommend the Pap and pelvic exam, combined with testing for human papilloma virus (HPV), every 5 years. Some types of HPV increase your risk of cervical cancer. Testing for HPV may also be done on women of any age with unclear Pap test results.  Other health care providers may not recommend any screening for nonpregnant women who are considered low risk for pelvic cancer and who do not have symptoms. Ask your health care provider if a screening pelvic exam is right for you.  If you have had past treatment for cervical cancer or a condition that could lead to cancer, you need Pap tests and screening for cancer for at least 20 years after your treatment. If Pap tests have been discontinued, your risk factors (such as having a new sexual partner) need to be reassessed to determine if screening should resume. Some women have medical problems that increase the chance of getting cervical cancer. In these cases, your health care provider may recommend more frequent screening and Pap tests. Colorectal Cancer  This type of cancer can be detected and often prevented.  Routine colorectal cancer screening usually begins at 28 years of age and continues through 28 years of age.  Your health care provider may recommend screening at an earlier age if you have risk factors for colon cancer.  Your health care provider may also  recommend using home test kits to check for hidden blood in the stool.  A small camera at the end of a tube can be used to examine your colon directly (sigmoidoscopy or colonoscopy). This is done to check for the earliest forms of colorectal cancer.  Routine screening usually begins at age 58.  Direct examination of the colon should be repeated every 5-10 years through 28 years of age. However, you may need to be screened more often if early forms of precancerous polyps or small growths are found. Skin Cancer  Check your skin from head to toe regularly.  Tell your health care provider about any new moles or changes in moles, especially if there is a change in a mole's shape or color.  Also tell your health care provider if you have a mole that is larger than the size of a pencil eraser.  Always use sunscreen. Apply sunscreen liberally and repeatedly throughout the day.  Protect yourself by wearing long sleeves, pants, a wide-brimmed hat, and sunglasses whenever you are  outside. HEART DISEASE, DIABETES, AND HIGH BLOOD PRESSURE   High blood pressure causes heart disease and increases the risk of stroke. High blood pressure is more likely to develop in:  People who have blood pressure in the high end of the normal range (130-139/85-89 mm Hg).  People who are overweight or obese.  People who are African American.  If you are 25-1 years of age, have your blood pressure checked every 3-5 years. If you are 65 years of age or older, have your blood pressure checked every year. You should have your blood pressure measured twice--once when you are at a hospital or clinic, and once when you are not at a hospital or clinic. Record the average of the two measurements. To check your blood pressure when you are not at a hospital or clinic, you can use:  An automated blood pressure machine at a pharmacy.  A home blood pressure monitor.  If you are between 75 years and 54 years old, ask your health  care provider if you should take aspirin to prevent strokes.  Have regular diabetes screenings. This involves taking a blood sample to check your fasting blood sugar level.  If you are at a normal weight and have a low risk for diabetes, have this test once every three years after 28 years of age.  If you are overweight and have a high risk for diabetes, consider being tested at a younger age or more often. PREVENTING INFECTION  Hepatitis B  If you have a higher risk for hepatitis B, you should be screened for this virus. You are considered at high risk for hepatitis B if:  You were born in a country where hepatitis B is common. Ask your health care provider which countries are considered high risk.  Your parents were born in a high-risk country, and you have not been immunized against hepatitis B (hepatitis B vaccine).  You have HIV or AIDS.  You use needles to inject street drugs.  You live with someone who has hepatitis B.  You have had sex with someone who has hepatitis B.  You get hemodialysis treatment.  You take certain medicines for conditions, including cancer, organ transplantation, and autoimmune conditions. Hepatitis C  Blood testing is recommended for:  Everyone born from [birth year] through 1963.  Anyone with known risk factors for hepatitis C. Sexually transmitted infections (STIs)  You should be screened for sexually transmitted infections (STIs) including gonorrhea and chlamydia if:  You are sexually active and are younger than 28 years of age.  You are older than 28 years of age and your health care provider tells you that you are at risk for this type of infection.  Your sexual activity has changed since you were last screened and you are at an increased risk for chlamydia or gonorrhea. Ask your health care provider if you are at risk.  If you do not have HIV, but are at risk, it may be recommended that you take a prescription medicine daily to prevent HIV  infection. This is called pre-exposure prophylaxis (PrEP). You are considered at risk if:  You are sexually active and do not regularly use condoms or know the HIV status of your partner(s).  You take drugs by injection.  You are sexually active with a partner who has HIV. Talk with your health care provider about whether you are at high risk of being infected with HIV. If you choose to begin PrEP, you should first be tested for HIV.  You should then be tested every 3 months for as long as you are taking PrEP.  PREGNANCY   If you are premenopausal and you may become pregnant, ask your health care provider about preconception counseling.  If you may become pregnant, take 400 to 800 micrograms (mcg) of folic acid every day.  If you want to prevent pregnancy, talk to your health care provider about birth control (contraception). OSTEOPOROSIS AND MENOPAUSE   Osteoporosis is a disease in which the bones lose minerals and strength with aging. This can result in serious bone fractures. Your risk for osteoporosis can be identified using a bone density scan.  If you are 30 years of age or older, or if you are at risk for osteoporosis and fractures, ask your health care provider if you should be screened.  Ask your health care provider whether you should take a calcium or vitamin D supplement to lower your risk for osteoporosis.  Menopause may have certain physical symptoms and risks.  Hormone replacement therapy may reduce some of these symptoms and risks. Talk to your health care provider about whether hormone replacement therapy is right for you.  HOME CARE INSTRUCTIONS   Schedule regular health, dental, and eye exams.  Stay current with your immunizations.   Do not use any tobacco products including cigarettes, chewing tobacco, or electronic cigarettes.  If you are pregnant, do not drink alcohol.  If you are breastfeeding, limit how much and how often you drink alcohol.  Limit  alcohol intake to no more than 1 drink per day for nonpregnant women. One drink equals 12 ounces of beer, 5 ounces of wine, or 1 ounces of hard liquor.  Do not use street drugs.  Do not share needles.  Ask your health care provider for help if you need support or information about quitting drugs.  Tell your health care provider if you often feel depressed.  Tell your health care provider if you have ever been abused or do not feel safe at home.   This information is not intended to replace advice given to you by your health care provider. Make sure you discuss any questions you have with your health care provider.   Document Released: 06/24/2011 Document Revised: 12/30/2014 Document Reviewed: 11/10/2013 Elsevier Interactive Patient Education [employer].

## 2016-09-17 NOTE — Progress Notes (Signed)
Subjective:    Patient ID: Christine Herring, female    DOB: 1986-02-13, 30 y.o.   MRN: [id number]  HPI: Christine Herring is a 30 y.o. female presenting on 09/17/2016 for Annual Exam   HPI  Pt presents for annual exam. Pt has co-morbid thyroid disease and anemia. Started iron after last visit. Feels better after taking the iron. Did cause some nausea.   Last pap was 2014. Will be due today. LMP: 08/20/2016 Contraception is tubal ligation.  Has noticed a strong smell to her urine. Strong smell past 2 weeks. If she hydrates it will go away. No dysuria. No flank pain. Similar issue in the past- was a UTI.  Exercise: Doing PT for her recent hip replacement in July.   Flu shot: Desires today.   Past Medical History:  Diagnosis Date  . Hip dysplasia   . Osteoporosis   . Thyroid disease    Past Surgical History:  Procedure Laterality Date  . TOTAL HIP ARTHROPLASTY Right   . TUBAL LIGATION      Social History   Social History  . Marital status: Single    Spouse name: N/A  . Number of children: N/A  . Years of education: N/A   Occupational History  . Not on file.   Social History Main Topics  . Smoking status: Former Smoker    Packs/day: 0.50    Years: 10.00    Quit date: 07/16/2005  . Smokeless tobacco: Former Neurosurgeon  . Alcohol use Yes  . Drug use: No  . Sexual activity: Yes    Birth control/ protection: Surgical   Other Topics Concern  . Not on file   Social History Narrative  . No narrative on file   Family History  Problem Relation Age of Onset  . Thyroid disease Mother    Current Outpatient Prescriptions on File Prior to Visit  Medication Sig  . Calcium Carb-Cholecalciferol 500-200 MG-UNIT TABS Take 2 tablets by mouth daily.  . ferrous sulfate 325 (65 FE) MG tablet Take 1 tablet (325 mg total) by mouth daily with breakfast.  . levothyroxine (SYNTHROID, LEVOTHROID) 100 MCG tablet Take 1 tablet (100 mcg total) by mouth daily.   No current facility-administered  medications on file prior to visit.     Review of Systems  Constitutional: Negative for chills and fever.  HENT: Negative.   Respiratory: Negative for cough, chest tightness and wheezing.   Cardiovascular: Negative for chest pain and leg swelling.  Gastrointestinal: Negative for abdominal pain, constipation, diarrhea, nausea and vomiting.  Endocrine: Negative.  Negative for cold intolerance, heat intolerance, polydipsia, polyphagia and polyuria.  Genitourinary: Negative for decreased urine volume, difficulty urinating, dysuria, flank pain, pelvic pain, vaginal bleeding, vaginal discharge and vaginal pain.       Foul smelling urine.  Musculoskeletal: Negative.   Neurological: Negative for dizziness, light-headedness and numbness.  Psychiatric/Behavioral: Negative.    Per HPI unless specifically indicated above     Objective:    BP 107/64   Pulse 69   Temp 99.1 F (37.3 C) (Oral)   Resp 16   Ht 5\' 4"  (1.626 m)   Wt 165 lb (74.8 kg)   LMP 08/20/2016   BMI 28.32 kg/m   Wt Readings from Last 3 Encounters:  09/17/16 165 lb (74.8 kg)  08/20/16 167 lb 9.6 oz (76 kg)  05/31/16 165 lb (74.8 kg)    Physical Exam  Constitutional: She is oriented to person, place, and time. She appears well-developed and well-nourished.  HENT:  Head: Normocephalic and atraumatic.  Neck: Neck supple.  Cardiovascular: Normal rate, regular rhythm and normal heart sounds.  Exam reveals no gallop and no friction rub.   No murmur heard. Pulmonary/Chest: Effort normal and breath sounds normal. She has no wheezes. She exhibits no tenderness. Right breast exhibits no inverted nipple, no mass, no nipple discharge, no skin change and no tenderness. Left breast exhibits no inverted nipple, no mass, no nipple discharge, no skin change and no tenderness. Breasts are symmetrical.  Abdominal: Soft. Normal appearance and bowel sounds are normal. She exhibits no distension and no mass. There is no tenderness. There is  no rebound and no guarding.  Genitourinary: Vagina normal. No labial fusion. There is no rash, tenderness, lesion or injury on the right labia. There is no rash, tenderness, lesion or injury on the left labia. Cervix exhibits discharge (white thin discharge- pt about to start her menstrual cycle. ). Cervix exhibits no motion tenderness and no friability. Right adnexum displays no mass, no tenderness and no fullness. Left adnexum displays no mass, no tenderness and no fullness. No erythema or bleeding in the vagina.  Musculoskeletal: Normal range of motion. She exhibits no edema or tenderness.  Lymphadenopathy:    She has no cervical adenopathy.  Neurological: She is alert and oriented to person, place, and time.  Skin: Skin is warm and dry.  Psychiatric: She has a normal mood and affect. Her behavior is normal. Judgment and thought content normal.   Results for orders placed or performed in visit on 09/17/16  POCT urinalysis dipstick  Result Value Ref Range   Color, UA yellow    Clarity, UA clear    Glucose, UA negative    Bilirubin, UA negative    Ketones, UA negative    Spec Grav, UA 1.020    Blood, UA moderate    pH, UA 5.0    Protein, UA negative    Urobilinogen, UA 0.2    Nitrite, UA positive    Leukocytes, UA Negative Negative      Assessment & Plan:   Problem List Items Addressed This Visit      Other   Iron deficiency anemia    Symptoms improving with iron. Recheck anemia panel today. Continue iron at this time. Recheck 3 mos.       Relevant Orders   CBC with Differential   Retic   Ferritin   Iron Binding Cap (TIBC)    Other Visit Diagnoses    Well woman exam with routine gynecological exam    -  Primary   Health maintenance reviewed. Pap smear done today. Return 1 year.    Relevant Orders   Pap,Surepath Rflx HPV   Screening for cervical cancer       Pap smear completed today.    Relevant Orders   Pap,Surepath Rflx HPV   Foul smelling urine       Treat for  UTI given +blood and nitrites. Cutlure. Macrobid BID for 5 days. Alarm symptoms reviewed. Return if not improving.    Relevant Medications   nitrofurantoin, macrocrystal-monohydrate, (MACROBID) 100 MG capsule   Other Relevant Orders   POCT urinalysis dipstick (Completed)   Urine culture   Needs flu shot       Relevant Orders   Flu Vaccine QUAD 36+ mos PF IM (Fluarix & Fluzone Quad PF) (Completed)      Meds ordered this encounter  Medications  . nitrofurantoin, macrocrystal-monohydrate, (MACROBID) 100 MG capsule    Sig:  Take 1 capsule (100 mg total) by mouth 2 (two) times daily.    Dispense:  10 capsule    Refill:  0    Order Specific Question:   Supervising Provider    Answer:   Janeann ForehandHAWKINS JR, JAMES H [[id number]]      Follow up plan: No Follow-up on file.

## 2016-09-17 NOTE — Assessment & Plan Note (Signed)
Symptoms improving with iron. Recheck anemia panel today. Continue iron at this time. Recheck 3 mos.

## 2016-09-18 ENCOUNTER — Other Ambulatory Visit: Payer: Self-pay | Admitting: Family Medicine

## 2016-09-18 ENCOUNTER — Ambulatory Visit: Payer: Managed Care, Other (non HMO) | Admitting: Physical Therapy

## 2016-09-18 ENCOUNTER — Encounter: Payer: Self-pay | Admitting: Physical Therapy

## 2016-09-18 DIAGNOSIS — R262 Difficulty in walking, not elsewhere classified: Secondary | ICD-10-CM

## 2016-09-18 DIAGNOSIS — E611 Iron deficiency: Secondary | ICD-10-CM

## 2016-09-18 DIAGNOSIS — M6281 Muscle weakness (generalized): Secondary | ICD-10-CM | POA: Diagnosis not present

## 2016-09-18 LAB — PAP,SUREPATH RFLX HPV

## 2016-09-18 LAB — FERRITIN: FERRITIN: 9 ng/mL — AB (ref 10–154)

## 2016-09-18 LAB — IRON AND TIBC
%SAT: 5 % — AB (ref 11–50)
IRON: 23 ug/dL — AB (ref 40–190)
TIBC: 447 ug/dL (ref 250–450)
UIBC: 424 ug/dL — ABNORMAL HIGH (ref 125–400)

## 2016-09-19 ENCOUNTER — Encounter: Payer: Managed Care, Other (non HMO) | Admitting: Physical Therapy

## 2016-09-19 NOTE — Therapy (Signed)
[facility] PH[facility]IC[facility] [address] [place], [place], [address] Phone: [phone]   Fax:  [phone]  Physical Therapy Treatment  Patient Details  Name: Christine HamSilvana Herring MRN: [id number] Date of Birth: March 04, 1986 Referring Provider: Dr. Melvyn NethLewis  Encounter Date: 09/18/2016      PT End of Session - 09/18/16 1959    Visit Number 14   Number of Visits 25   Date for PT Re-Evaluation 10/25/16   PT Start Time 1854   PT Stop Time 1945   PT Time Calculation (min) 51 min   Activity Tolerance Patient tolerated treatment well   Behavior During Therapy [facility] for tasks assessed/performed      Past Medical History:  Diagnosis Date  . Hip dysplasia   . Osteoporosis   . Thyroid disease     Past Surgical History:  Procedure Laterality Date  . TOTAL HIP ARTHROPLASTY Right   . TUBAL LIGATION      There were no vitals filed for this visit.      Subjective Assessment - 09/18/16 1958    Subjective left knee pain medial aspect; has been up on feet more with work related tasks.    Patient Stated Goals to get back to normal in order to be able to perform daily tasks difficulty   Currently in Pain? Yes   Pain Score 5       Objective; Gait; without crutches (+) hiking left hip, decreased hip/knee flexion, decreased weight shift to left    Treatment: Modalities: [place] electrical stimulation: 10/10 cycle; applied (2) electrodes to quadriceps/VMO left LE and high volt estim along medial and lateral aspect of left knee with moist heat applied to same  (2) electrodes: intensity to comfort: with patient supine lying with left LE supported on pillow with patient performing quad sets each cycle x 20min.: Goal: neuromuscular education; pain control  Therapeutic exercises: patient performed exercises with guidance/assistance, verbal and tactile cues and demonstration of PT:  Sitting/standing: Sitting hip adduction with ball and glute  sets x 10 Hip abduction sitting with red resistive band x 15 reps Knee flexion with red resistive band x 15 reps with assistance of therapist Supine lying: Hip abduction with end range hold x 5 seconds x 10 reps Hip ER in hooklying with isometric hold end range x 10 reps  NuStep x 5min. Independent: unbilled:  level #2, pre treatment  Patient response to treatment:Decreased pain to 0/10 following moist heat/estim. And exercise, still unable to perform knee extension in sitting without increased pain in left knee, patient verbalized good understanding of home program for pain control, exercises; no adverse reaction to moist heat noted          PT Education - 09/18/16 2000    Education provided Yes   Education Details HEP: use of heat to control pain, continue with quad setting, using crutches to decreased left knee pain   Person(s) Educated Patient   Methods Explanation;Demonstration;Verbal cues   Comprehension Verbalized understanding;Returned demonstration;Verbal cues required             PT Long Term Goals - 08/02/16 1217      PT LONG TERM GOAL #1   Title Pt will improve 5x STS to <10 seconds for improved functional mobility within 12 weeks.   Baseline 17.98 sec   Time 12   Period Weeks   Status New     PT LONG TERM GOAL #2   Title Pt will improve TUG to <10  seconds for improved functional mobility within 12 weeks with LRAD.   Baseline 27.01 sec with crutches   Time 12   Period Weeks   Status New     PT LONG TERM GOAL #3   Title Pt will score >1.0 m/s with LRAD on 10 MW test for community mobility and return to PLOF within 12 weeks.   Baseline 0.42 m/s with crutches   Time 12   Period Weeks   Status New     PT LONG TERM GOAL #4   Title Pt will improve LEFS score by 9 points for improved functional mobility within 12 weeks.   Baseline 14/80   Time 12   Period Weeks   Status New               Plan - 09/18/16 2000    Clinical Impression  Statement patient is progressing slowly iwth left LE strength and function due to significant weakness. She is responding well to durrent treatment.   Rehab Potential Good   PT Frequency 2x / week   PT Duration 12 weeks   PT Treatment/Interventions DME Instruction;Gait training;Stair training;Therapeutic activities;Therapeutic exercise;Balance training;Neuromuscular re-education;Patient/family education;Manual techniques;Passive range of motion;Electrical Stimulation   PT Next Visit Plan focus on motor control, strengthening left hip, electrical stimulation russian stim. to left quadriceps   PT Home Exercise Plan continue with exercises as instructed and work on control of hip musculature for flexion, abduction      Patient will benefit from skilled therapeutic intervention in order to improve the following deficits and impairments:  Abnormal gait, Decreased knowledge of use of DME, Decreased strength, Difficulty walking, Impaired flexibility, Impaired sensation, Pain, Increased muscle spasms  Visit Diagnosis: Muscle weakness (generalized)  Difficulty in walking, not elsewhere classified     Problem List Patient Active Problem List   Diagnosis Date Noted  . Iron deficiency anemia 09/17/2016  . Hypothyroidism 07/17/2015  . Disorder of pelvis 07/17/2015  . OP (osteoporosis) 07/17/2015    Beacher May PT 09/19/2016, 11:37 PM  [facility] PHYSICAL AND SPORTS MEDICINE [address] [place], [address] Phone: [phone]   Fax:  [phone]  Name: Christine Herring MRN: [id number] Date of Birth: 19-Sep-1986

## 2016-09-20 LAB — URINE CULTURE

## 2016-09-23 ENCOUNTER — Ambulatory Visit: Payer: Managed Care, Other (non HMO) | Admitting: Physical Therapy

## 2016-09-24 ENCOUNTER — Encounter: Payer: Managed Care, Other (non HMO) | Admitting: Physical Therapy

## 2016-09-24 ENCOUNTER — Encounter: Payer: Self-pay | Admitting: *Deleted

## 2016-09-25 ENCOUNTER — Ambulatory Visit: Payer: Managed Care, Other (non HMO) | Attending: Surgery | Admitting: Physical Therapy

## 2016-09-25 ENCOUNTER — Encounter: Payer: Self-pay | Admitting: Physical Therapy

## 2016-09-25 DIAGNOSIS — R262 Difficulty in walking, not elsewhere classified: Secondary | ICD-10-CM | POA: Insufficient documentation

## 2016-09-25 DIAGNOSIS — M6281 Muscle weakness (generalized): Secondary | ICD-10-CM | POA: Diagnosis not present

## 2016-09-25 NOTE — Therapy (Signed)
[facility] PH[facility]ORTS MEDICINE [address], [place], [address] Phone: [phone]   Fax:  [phone]  Physical Therapy Treatment  Patient Details  Name: Christine Herring MRN: [id number] Date of Birth: April 05, 1986 Referring Provider: Dr. Melvyn Neth  Encounter Date: 09/25/2016      PT End of Session - 09/25/16 1641    Visit Number 15   Number of Visits 25   Date for PT Re-Evaluation 10/25/16   PT Start Time 1535   PT Stop Time 1635   PT Time Calculation (min) 60 min   Equipment Utilized During Treatment Other (comment)  bilateral axillary crutches   Activity Tolerance Patient tolerated treatment well   Behavior During Therapy [facility] for tasks assessed/performed      Past Medical History:  Diagnosis Date  . Hip dysplasia   . Osteoporosis   . Thyroid disease     Past Surgical History:  Procedure Laterality Date  . TOTAL HIP ARTHROPLASTY Right   . TUBAL LIGATION      There were no vitals filed for this visit.      Subjective Assessment - 09/25/16 1540    Subjective walking a little in kitchen at home with one crutch. Still unable to perform SLR without assistance, and requires assistance to get on/off bed   Limitations Sitting;Standing;Walking   Patient Stated Goals to get back to normal in order to be able to perform daily tasks difficulty   Currently in Pain? No/denies        Objective; Gait; without crutches (+) hiking left hip, decreased hip/knee flexion, decreased weight shift to left    Treatment: Modalities: [place] electrical stimulation: 10/10 cycle; applied (2) electrodes to quadriceps/VMO left LE with patient supine lying with left LE supported on pillow with patient performing quad sets each cycle x .: Goal: neuromuscular education; pain control  Therapeutic exercises: patient performed exercises with guidance/assistance, verbal and tactile cues and demonstration of PT: Supine lying: Hip  abduction with end range hold x 5 seconds x 10 reps Hip ER in hooklying with isometric hold end range x 10 reps Hook lying hip adduction with ball and glute sets with pelvic tilt x 10 reps Standing weight shifting on blue balance airex pad x 1 min.Reita Chard and side to side  NuStep x . Independent: unbilled: level #2, post exercise  Patient response to treatment: patient demonstrated improved motor control and strength with transfers on/off treatment table, improved ability to perform weight shifting with increased control with repetition; still unable to perform SLR > 1" off table without significant increased effort        PT Education - 09/25/16 1639    Education provided Yes   Education Details HEP; supine hooklying hip adduction with pelvic tilt/glute sets   Person(s) Educated Patient   Methods Demonstration;Tactile cues;Explanation   Comprehension Verbalized understanding;Returned demonstration;Verbal cues required             PT Long Term Goals - 08/02/16 1217      PT LONG TERM GOAL #1   Title Pt will improve 5x STS to <10 seconds for improved functional mobility within 12 weeks.   Baseline 17.98 sec   Time 12   Period Weeks   Status New     PT LONG TERM GOAL #2   Title Pt will improve TUG to <10 seconds for improved functional mobility within 12 weeks with LRAD.   Baseline 27.01 sec with crutches   Time 12   Period  Weeks   Status New     PT LONG TERM GOAL #3   Title Pt will score >1.0 m/s with LRAD on 10 MW test for community mobility and return to PLOF within 12 weeks.   Baseline 0.42 m/s with crutches   Time 12   Period Weeks   Status New     PT LONG TERM GOAL #4   Title Pt will improve LEFS score by 9 points for improved functional mobility within 12 weeks.   Baseline 14/80   Time 12   Period Weeks   Status New               Plan - 09/25/16 1642    Clinical Impression Statement Patient with improved motor control with  improving strength and abiltiy to ambulate with one crutch and no crutches intermittently with counter support at home. She continues with significant weakness and decreased function with daily tasks and will require additional physical therapy intervention to achieve goals    Rehab Potential Good   PT Frequency 2x / week   PT Duration 12 weeks   PT Treatment/Interventions DME Instruction;Gait training;Stair training;Therapeutic activities;Therapeutic exercise;Balance training;Neuromuscular re-education;Patient/family education;Manual techniques;Passive range of motion;Electrical Stimulation   PT Next Visit Plan focus on motor control, strengthening left hip, electrical stimulation russian stim. to left quadriceps   PT Home Exercise Plan continue with exercises as instructed and work on control of hip musculature for flexion, abduction      Patient will benefit from skilled therapeutic intervention in order to improve the following deficits and impairments:  Abnormal gait, Decreased knowledge of use of DME, Decreased strength, Difficulty walking, Impaired flexibility, Impaired sensation, Pain, Increased muscle spasms  Visit Diagnosis: Muscle weakness (generalized)  Difficulty in walking, not elsewhere classified     Problem List Patient Active Problem List   Diagnosis Date Noted  . Iron deficiency anemia 09/17/2016  . Status post total replacement of left hip 07/11/2016  . Chronic hypotension 07/02/2016  . Collapse of lung 07/02/2016  . H/O hepatitis 07/02/2016  . History of positive PPD 07/02/2016  . History of seizures as a child 07/02/2016  . Hypokalemia 07/02/2016  . Acquired dysplasia of left hip 04/19/2016  . Acquired dysplasia of right hip 04/19/2016  . 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 Hypothyroidism 07/17/2015  . Disorder of pelvis 07/17/2015  . OP (osteoporosis) 07/17/2015  . Lumbar radicular pain 08/04/2013  . Lumbar scoliosis 08/04/2013     Beacher MayBrooks, Marie PT 09/25/2016, 4:44 PM  [facility] [address] [place], [place], [address] Phone: [phone]   Fax:  [phone]  Name: Christine Herring MRN: [id number] Date of Birth: 1986/05/21

## 2016-09-26 ENCOUNTER — Encounter: Payer: Managed Care, Other (non HMO) | Admitting: Physical Therapy

## 2016-09-30 ENCOUNTER — Ambulatory Visit: Payer: Managed Care, Other (non HMO) | Admitting: Physical Therapy

## 2016-09-30 ENCOUNTER — Encounter: Payer: Self-pay | Admitting: Physical Therapy

## 2016-09-30 DIAGNOSIS — R262 Difficulty in walking, not elsewhere classified: Secondary | ICD-10-CM

## 2016-09-30 DIAGNOSIS — M6281 Muscle weakness (generalized): Secondary | ICD-10-CM | POA: Diagnosis not present

## 2016-09-30 NOTE — Therapy (Signed)
[facility] [address] [place], [place], [address] Phone: [phone]   Fax:  [phone]  Physical Therapy Treatment  Patient Details  Name: Christine HamSilvana Herring MRN: [id number] Date of Birth: 01/14/1986 Referring Provider: Dr. Melvyn NethLewis  Encounter Date: 09/30/2016      Christine End of Session - 09/30/16 1553    Visit Number 16   Number of Visits 25   Date for Christine Re-Evaluation 10/25/16   Christine Start Time 1535   Christine Stop Time 1615   Christine Time Calculation (min) 40 min   Equipment Utilized During Treatment Other (comment)  axillary crutches   Activity Tolerance Patient tolerated treatment well;Patient limited by fatigue   Behavior During Therapy [facility] for tasks assessed/performed      Past Medical History:  Diagnosis Date  . Hip dysplasia   . Osteoporosis   . Thyroid disease     Past Surgical History:  Procedure Laterality Date  . TOTAL HIP ARTHROPLASTY Right   . TUBAL LIGATION      There were no vitals filed for this visit.      Subjective Assessment - 09/30/16 1550    Subjective Patient reports she is now able to put on shoes with minimal difficulty. patient reports she has been walking more with only one crutch and today her left knee is hurting.   Limitations Sitting;Standing;Walking   Patient Stated Goals to get back to normal in order to be able to perform daily tasks difficulty   Currently in Pain? Yes   Pain Score 4    Pain Location Knee   Pain Orientation Left   Pain Descriptors / Indicators Aching;Sore   Pain Type Acute pain;Surgical pain  07/11/2016   Pain Onset More than a month ago      Treatment: Modalities: [place] electrical stimulation: 10/10 cycle; applied (2) electrodes to quadriceps/VMO left LE with patient supine lying with left LE supported on pillow with patient performing quad sets each cycle x 20min.: Goal: neuromuscular education; pain control  Therapeutic exercises: patient performed  exercises with guidance/assistance, verbal and tactile cues and demonstration of Christine: Supine lying: Hip abduction with end range hold x 5 seconds x 10 reps Hip ER in hooklying with isometric hold end range x 10 reps Hook lying hip adduction with ball and glute sets with pelvic tilt/bridging x 10 reps   NuStep x 10min. Independent: unbilled: level #2, post exercise (not today due to fatigue)  Patient response to treatment: Limited exercises due to fatigue today. She demonstrated improved ability to perform exercises with increased resistance for hip abduction and ER. Improved quad contraction with decreased intensity with estim.        Christine Education - 09/30/16 1552    Education provided Yes   Education Details HEP; supine hook lying pelvic tilts, partial bridging   Person(s) Educated Patient   Methods Explanation;Verbal cues   Comprehension Verbalized understanding;Returned demonstration;Verbal cues required             Christine Long Term Goals - 08/02/16 1217      Christine LONG TERM GOAL #1   Title Christine Herring improve 5x STS to <10 seconds for improved functional mobility within 12 weeks.   Baseline 17.98 sec   Time 12   Period Weeks   Status New     Christine LONG TERM GOAL #2   Title Christine Herring improve TUG to <10 seconds for improved functional mobility within 12 weeks with LRAD.   Baseline  27.01 sec with crutches   Time 12   Period Weeks   Status New     Christine LONG TERM GOAL #3   Title Christine Herring score >1.0 m/s with LRAD on 10 MW test for community mobility and return to PLOF within 12 weeks.   Baseline 0.42 m/s with crutches   Time 12   Period Weeks   Status New     Christine LONG TERM GOAL #4   Title Christine Herring improve LEFS score by 9 points for improved functional mobility within 12 weeks.   Baseline 14/80   Time 12   Period Weeks   Status New               Plan - 09/30/16 1554    Clinical Impression Statement Patient continues to progress with imrpove gait and abiltiy to walk and  perform personal care tasks with less difficulty. Session limited today due to fatigue. She should continue to  Improve endurance/strength with continued physical therapy intervention.   Rehab Potential Good   Christine Frequency 2x / week   Christine Duration 12 weeks   Christine Treatment/Interventions DME Instruction;Gait training;Stair training;Therapeutic activities;Therapeutic exercise;Balance training;Neuromuscular re-education;Patient/family education;Manual techniques;Passive range of motion;Electrical Stimulation   Christine Next Visit Plan focus on motor control, strengthening left hip, electrical stimulation russian stim. to left quadriceps   Christine Home Exercise Plan continue with exercises as instructed and work on control of hip musculature for flexion, abduction      [name] benefit from skilled therapeutic intervention in order to improve the following deficits and impairments:  Abnormal gait, Decreased knowledge of use of DME, Decreased strength, Difficulty walking, Impaired flexibility, Impaired sensation, Pain, Increased muscle spasms  Visit Diagnosis: Muscle weakness (generalized)  Difficulty in walking, not elsewhere classified     Problem List Patient Active Problem List   Diagnosis Date Noted  . Iron deficiency anemia 09/17/2016  . Status post total replacement of left hip 07/11/2016  . Chronic hypotension 07/02/2016  . Collapse of lung 07/02/2016  . H/O hepatitis 07/02/2016  . History of positive PPD 07/02/2016  . History of seizures as a child 07/02/2016  . Hypokalemia 07/02/2016  . Acquired dysplasia of left hip 04/19/2016  . Acquired dysplasia of right hip 04/19/2016  . 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 Hypothyroidism 07/17/2015  . Disorder of pelvis 07/17/2015  . OP (osteoporosis) 07/17/2015  . Lumbar radicular pain 08/04/2013  . Lumbar scoliosis 08/04/2013    Beacher May Christine 09/30/2016, 9:21 PM  [facility]  PHYSICAL AND SPORTS MEDICINE [address] [place], [address] Phone: [phone]   Fax:  [phone]  Name: Christine Herring MRN: [id number] Date of Birth: 08-11-1986

## 2016-10-01 ENCOUNTER — Encounter: Payer: Managed Care, Other (non HMO) | Admitting: Physical Therapy

## 2016-10-02 ENCOUNTER — Ambulatory Visit: Payer: Managed Care, Other (non HMO) | Admitting: Physical Therapy

## 2016-10-02 ENCOUNTER — Encounter: Payer: Self-pay | Admitting: Physical Therapy

## 2016-10-02 DIAGNOSIS — M6281 Muscle weakness (generalized): Secondary | ICD-10-CM

## 2016-10-02 DIAGNOSIS — R262 Difficulty in walking, not elsewhere classified: Secondary | ICD-10-CM

## 2016-10-02 NOTE — Therapy (Signed)
[facility]ICINE [address] [place], [address] Phone: [phone]   Fax:  [phone]  Physical Therapy Treatment  Patient Details  Name: Christine Herring MRN: [id number] Date of Birth: [birth year] Referring Provider: Dr. Melvyn Neth  Encounter Date: 10/02/2016      PT End of Session - 10/02/16 1645    Visit Number 17   Number of Visits 25   Date for PT Re-Evaluation 10/25/16   PT Start Time 1530   PT Stop Time 1629   PT Time Calculation (min) 59 min   Equipment Utilized During Treatment Other (comment)  1 axillary crutch   Activity Tolerance Patient tolerated treatment well;Patient limited by fatigue   Behavior During Therapy [facility] for tasks assessed/performed      Past Medical History:  Diagnosis Date  . Hip dysplasia   . Osteoporosis   . Thyroid disease     Past Surgical History:  Procedure Laterality Date  . TOTAL HIP ARTHROPLASTY Right   . TUBAL LIGATION      There were no vitals filed for this visit.      Subjective Assessment - 10/02/16 1540    Subjective Patient reports she is using one crutch outside the home for short distances. She alternates to 2 crutches if needed.    Limitations Sitting;Standing;Walking   Patient Stated Goals to get back to normal in order to be able to perform daily tasks difficulty   Currently in Pain? Yes   Pain Score 4    Pain Location Knee   Pain Orientation Left   Pain Descriptors / Indicators Aching;Sore   Pain Type Acute pain;Surgical pain  07/11/2016   Pain Onset More than a month ago     Objective: Gait: ambulating with one axillary crutch WBAT LLE, slow cadence to perform with good gait pattern Strength: left LE hip abduction 3+/5, ER 3+/5 (side lying), left knee extension 3+/5, flexion 4-/5  Treatment: Modalities: [place] electrical stimulation: 10/10 cycle; applied (2) electrodes to quadriceps/VMO left LE with patient supine lying with left LE  supported on pillow with patient performing quad sets each cycle x .: Goal: neuromuscular education; pain control  Therapeutic exercises: patient performed exercises with guidance/assistance, verbal and tactile cues and demonstration of PT: Supine lying: Hip abduction with end range hold x 5 seconds x 10 reps Hip ER in hooklying with isometric hold end range x 10 reps Hook lying hip adduction with ball and glute sets with pelvic tilt/bridging x 10 reps Sitting/standing:  Sit to stand with 4" step under right LE and then left LE x 10 reps each   NuStep x 7.5 min. Independent: unbilled: level #3, post exercise with fatigue noted  Patient response to treatment: Improved weight shifting to left LE following sit to stand exercise with VC and guidance. Mild to moderate fatigue noted at end of session. Patient able to tolerate increased resistance during manual resisted exercises as compared to previous session.        PT Education - 10/02/16 1630    Education provided Yes   Education Details HEP: added sit to stand with one foot (right) on 4" step to increased weight bearing on right   Person(s) Educated Patient   Methods Explanation;Demonstration;Verbal cues   Comprehension Verbalized understanding;Returned demonstration;Verbal cues required             PT Long Term Goals - 08/02/16 1217      PT LONG TERM GOAL #1   Title Pt  will improve 5x STS to <10 seconds for improved functional mobility within 12 weeks.   Baseline 17.98 sec   Time 12   Period Weeks   Status New     PT LONG TERM GOAL #2   Title Pt will improve TUG to <10 seconds for improved functional mobility within 12 weeks with LRAD.   Baseline 27.01 sec with crutches   Time 12   Period Weeks   Status New     PT LONG TERM GOAL #3   Title Pt will score >1.0 m/s with LRAD on 10 MW test for community mobility and return to PLOF within 12 weeks.   Baseline 0.42 m/s with crutches   Time 12   Period Weeks    Status New     PT LONG TERM GOAL #4   Title Pt will improve LEFS score by 9 points for improved functional mobility within 12 weeks.   Baseline 14/80   Time 12   Period Weeks   Status New               Plan - 10/02/16 1645    Clinical Impression Statement Patient continues to progress well and demonstrated increased weight bearing onto right LE following exercises. She continues with significant weakness in left LE and will require continued physical therapy intervention to achieve goals.    Rehab Potential Good   PT Frequency 2x / week   PT Duration 12 weeks   PT Treatment/Interventions DME Instruction;Gait training;Stair training;Therapeutic activities;Therapeutic exercise;Balance training;Neuromuscular re-education;Patient/family education;Manual techniques;Passive range of motion;Electrical Stimulation   PT Next Visit Plan focus on motor control, strengthening left hip, electrical stimulation russian stim. to left quadriceps   PT Home Exercise Plan continue with exercises as instructed and work on control of hip musculature for flexion, abduction      Patient will benefit from skilled therapeutic intervention in order to improve the following deficits and impairments:  Abnormal gait, Decreased knowledge of use of DME, Decreased strength, Difficulty walking, Impaired flexibility, Impaired sensation, Pain, Increased muscle spasms  Visit Diagnosis: Muscle weakness (generalized)  Difficulty in walking, not elsewhere classified     Problem List Patient Active Problem List   Diagnosis Date Noted  . Iron deficiency anemia 09/17/2016  . Status post total replacement of left hip 07/11/2016  . Chronic hypotension 07/02/2016  . Collapse of lung 07/02/2016  . H/O hepatitis 07/02/2016  . History of positive PPD 07/02/2016  . History of seizures as a child 07/02/2016  . Hypokalemia 07/02/2016  . Acquired dysplasia of left hip 04/19/2016  . Acquired dysplasia of right hip  04/19/2016  . 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 Hypothyroidism 07/17/2015  . Disorder of pelvis 07/17/2015  . OP (osteoporosis) 07/17/2015  . Lumbar radicular pain 08/04/2013  . Lumbar scoliosis 08/04/2013    Beacher May PT 10/02/2016, 4:47 PM  [place] [facility] PHYSICAL AND SPORTS MEDICINE [address] [place], [address] Phone: [phone]   Fax:  [phone]  Name: Bryn Saline MRN: [id number] Date of Birth: [birth year]

## 2016-10-03 ENCOUNTER — Encounter: Payer: Managed Care, Other (non HMO) | Admitting: Physical Therapy

## 2016-10-07 ENCOUNTER — Encounter: Payer: Self-pay | Admitting: Physical Therapy

## 2016-10-07 ENCOUNTER — Ambulatory Visit: Payer: Managed Care, Other (non HMO) | Admitting: Physical Therapy

## 2016-10-07 DIAGNOSIS — R262 Difficulty in walking, not elsewhere classified: Secondary | ICD-10-CM

## 2016-10-07 DIAGNOSIS — M6281 Muscle weakness (generalized): Secondary | ICD-10-CM | POA: Diagnosis not present

## 2016-10-07 NOTE — Therapy (Signed)
[facility] [address] [place], [place], [address] Phone: [phone]   Fax:  [phone]  Physical Therapy Treatment  Patient Details  Name: Christine HamSilvana Kock MRN: [id number] Date of Birth: January 24, 1986 Referring Provider: Dr. Melvyn NethLewis  Encounter Date: 10/07/2016      PT End of Session - 10/07/16 1639    Visit Number 18   Number of Visits 25   Date for PT Re-Evaluation 10/25/16   PT Start Time 1533   PT Stop Time 1630   PT Time Calculation (min) 57 min   Activity Tolerance Patient tolerated treatment well;Patient limited by fatigue   Behavior During Therapy [facility] for tasks assessed/performed      Past Medical History:  Diagnosis Date  . Hip dysplasia   . Osteoporosis   . Thyroid disease     Past Surgical History:  Procedure Laterality Date  . TOTAL HIP ARTHROPLASTY Right   . TUBAL LIGATION      There were no vitals filed for this visit.      Subjective Assessment - 10/07/16 1547    Subjective Patient reports she is walking more and doing more at home now. She is using brace on left knee for support and this seems to be helping with decreasing knee pain with standing/walking activities.     Patient Stated Goals to get back to normal in order to be able to perform daily tasks difficulty   Currently in Pain? No/denies      Objective: Gait; ambulating with one axillary crutch WBAT LLE, mild limp on left Strength: left LE hip abduction 3+/5, ER 3+/5, knee extension 3+/5  Treatment: Modalities: [place] electrical stimulation: 10/10 cycle; applied (2) electrodes to quadriceps/VMO left LE with patient supine lying with left LE supported on pillow with patient performing quad sets each cycle x 20min.: Goal: neuromuscular education; pain control  Therapeutic exercises: patient performed exercises with guidance/assistance, verbal, tactile cues and demonstration of therapist: Side lying:  Hip abduction x 5  reps with CG assist of therapist Supine lying: Hook lying hip adduction with band around thighs and glute sets with pelvic tilt/bridging, stabilization with hip abduction each repx 10 reps Sitting/standing:  Knee flexion with red resistive band x 25 reps Isometric hip extension at wall with left LE, knee flexed to 90 5 repetitions each LE, keeping hip in neutral alignment  NuStep x 10 min. Independent: unbilled: level #3, post exercise with fatigue noted  Patient response to treatment: patient demonstrated improved technique with exercises with minimal VC for correct alignment.  Improved motor control with repetition and cuing, following estim. Mild to moderate fatigue noted at end of session.            PT Education - 10/07/16 1638    Education provided Yes   Education Details HEP: standing hip extension isometric against wall 5 reps x 5 seconds   Person(s) Educated Patient   Methods Explanation;Demonstration;Verbal cues   Comprehension Verbalized understanding;Returned demonstration;Verbal cues required             PT Long Term Goals - 08/02/16 1217      PT LONG TERM GOAL #1   Title Pt will improve 5x STS to <10 seconds for improved functional mobility within 12 weeks.   Baseline 17.98 sec   Time 12   Period Weeks   Status New     PT LONG TERM GOAL #2   Title Pt will improve TUG to <10 seconds for improved functional  mobility within 12 weeks with LRAD.   Baseline 27.01 sec with crutches   Time 12   Period Weeks   Status New     PT LONG TERM GOAL #3   Title Pt will score >1.0 m/s with LRAD on 10 MW test for community mobility and return to PLOF within 12 weeks.   Baseline 0.42 m/s with crutches   Time 12   Period Weeks   Status New     PT LONG TERM GOAL #4   Title Pt will improve LEFS score by 9 points for improved functional mobility within 12 weeks.   Baseline 14/80   Time 12   Period Weeks   Status New               Plan - 10/07/16 1631     Clinical Impression Statement patient is progressing well with goals with improving strength left hip abduction and ER to allow walking with less assistance, increased weight bearing left LE. She continues with significant weakness and will benefit from continued physical therapy intervention to achieve full function.    Rehab Potential Good   PT Frequency 2x / week   PT Duration 12 weeks   PT Treatment/Interventions DME Instruction;Gait training;Stair training;Therapeutic activities;Therapeutic exercise;Balance training;Neuromuscular re-education;Patient/family education;Manual techniques;Passive range of motion;Electrical Stimulation   PT Next Visit Plan focus on motor control, strengthening left hip, electrical stimulation russian stim. to left quadriceps   PT Home Exercise Plan continue with exercises as instructed and work on control of hip musculature for flexion, abduction, add isometri hip extension in neutral position in standing      Patient will benefit from skilled therapeutic intervention in order to improve the following deficits and impairments:  Abnormal gait, Decreased knowledge of use of DME, Decreased strength, Difficulty walking, Impaired flexibility, Impaired sensation, Pain, Increased muscle spasms  Visit Diagnosis: Muscle weakness (generalized)  Difficulty in walking, not elsewhere classified     Problem List Patient Active Problem List   Diagnosis Date Noted  . Iron deficiency anemia 09/17/2016  . Status post total replacement of left hip 07/11/2016  . Chronic hypotension 07/02/2016  . Collapse of lung 07/02/2016  . H/O hepatitis 07/02/2016  . History of positive PPD 07/02/2016  . History of seizures as a child 07/02/2016  . Hypokalemia 07/02/2016  . Acquired dysplasia of left hip 04/19/2016  . Acquired dysplasia of right hip 04/19/2016  . 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 Hypothyroidism 07/17/2015  . Disorder of pelvis  07/17/2015  . OP (osteoporosis) 07/17/2015  . Lumbar radicular pain 08/04/2013  . Lumbar scoliosis 08/04/2013    Beacher May PT 10/07/2016, 10:18 PM  [place] [facility] PHYSICAL AND SPORTS MEDICINE [address], [place], [address] Phone: [phone]   Fax:  [phone]  Name: Kaneisha Ellenberger MRN: [id number] Date of Birth: 1986/08/28

## 2016-10-08 ENCOUNTER — Encounter: Payer: Managed Care, Other (non HMO) | Admitting: Physical Therapy

## 2016-10-09 ENCOUNTER — Ambulatory Visit: Payer: Managed Care, Other (non HMO) | Admitting: Physical Therapy

## 2016-10-09 ENCOUNTER — Encounter: Payer: Self-pay | Admitting: Physical Therapy

## 2016-10-09 DIAGNOSIS — M6281 Muscle weakness (generalized): Secondary | ICD-10-CM | POA: Diagnosis not present

## 2016-10-09 DIAGNOSIS — R262 Difficulty in walking, not elsewhere classified: Secondary | ICD-10-CM

## 2016-10-10 ENCOUNTER — Encounter: Payer: Managed Care, Other (non HMO) | Admitting: Physical Therapy

## 2016-10-10 NOTE — Therapy (Signed)
[facility] [address] , [place], [address] Phone: [phone]   Fax:  [phone]  Physical Therapy Treatment  Patient Details  Name: Christine Herring MRN: [id number] Date of Birth: 09/29/1986 Referring Provider: Dr. Melvyn NethLewis  Encounter Date: 10/09/2016      PT End of Session - 10/09/16 1550    Visit Number 19   Number of Visits 25   Date for PT Re-Evaluation 10/25/16   PT Start Time 1535   PT Stop Time 1638   PT Time Calculation (min) 63 min   Equipment Utilized During Treatment Other (comment)  one axillary crutch, knee brace left knee   Activity Tolerance Patient tolerated treatment well;Patient limited by fatigue   Behavior During Therapy [facility] for tasks assessed/performed      Past Medical History:  Diagnosis Date  . Hip dysplasia   . Osteoporosis   . Thyroid disease     Past Surgical History:  Procedure Laterality Date  . TOTAL HIP ARTHROPLASTY Right   . TUBAL LIGATION      There were no vitals filed for this visit.      Subjective Assessment - 10/09/16 1546    Subjective Patient reports increased soreness in her lower back today (SIJ region) and she feels more stiffness with the cold weather she is feeling stiff in her legs, left>right. She is having stiffness in left hip with flexing hip in sitting with putting on shoes.    Limitations Sitting;Standing;Walking   Patient Stated Goals to get back to normal in order to be able to perform daily tasks difficulty   Currently in Pain? No/denies      Objective: Gait; using one axillary crutch for support right UE, knee brace left knee, WBAT LLE with short controlled step length, decreased trunk rotation  Strength: left LE hip abduction 3+/5, ER 3+/5, knee extension 3+/5  Treatment: Modalities: [place] electrical stimulation: 10/10 cycle; applied (2) electrodes to quadriceps/VMO left LE with patient supine lying with left LE supported on  pillow with patient performing quad sets each cycle x 20min.: Goal: neuromuscular education; pain control  Therapeutic exercises: patient performed exercises with guidance/assistance, verbal, tactile cues and demonstration of therapist: Supine lying: Hook lying: ER isometrics bilaterally multiple angles x 5 repetitions followed by isometric hip adduction x 3 reps Hip abduction with leg extended isometric hold end range with eccentric controlled motion x 10 reps Sitting/standing:  Knee flexion with red resistive band x 25 reps Standing balance/proprioception exercises:  Ball toss x 15 reps with patient standing on balance pad with staggered stance with each LE behind the other  Sitting controlled motion for ankle PF/DF x 2 min. And side to side x 2 min.  NuStep x 10 min. Independent: unbilled: level #3, post exercise with mild fatigue noted  Patient response to treatment: Patient demonstrated improved gait pattern following exercises with isometric hip abduction/adduction with improved trunk rotation and decreased pain in both hips to mild 0/10. Patient required assistance for exercises to perform with proper alignment and to un weight left LE for supine exercises. Improved motor control with all exercises with repetition.           PT Education - 10/09/16 1548    Education provided Yes   Education Details HEP: re assess hip extension i[employer] against wall   Person(s) Educated Patient   Methods Explanation;Demonstration;Verbal cues   Comprehension Verbalized understanding;Returned demonstration;Verbal cues required  PT Long Term Goals - 08/02/16 1217      PT LONG TERM GOAL #1   Title Pt will improve 5x STS to <10 seconds for improved functional mobility within 12 weeks.   Baseline 17.98 sec   Time 12   Period Weeks   Status New     PT LONG TERM GOAL #2   Title Pt will improve TUG to <10 seconds for improved functional mobility within 12 weeks with  LRAD.   Baseline 27.01 sec with crutches   Time 12   Period Weeks   Status New     PT LONG TERM GOAL #3   Title Pt will score >1.0 m/s with LRAD on 10 MW test for community mobility and return to PLOF within 12 weeks.   Baseline 0.42 m/s with crutches   Time 12   Period Weeks   Status New     PT LONG TERM GOAL #4   Title Pt will improve LEFS score by 9 points for improved functional mobility within 12 weeks.   Baseline 14/80   Time 12   Period Weeks   Status New               Plan - 10/09/16 1640    Clinical Impression Statement Patient is progressing well with goals with improving strength, motor control left hip abduction and ER to allow improved gait pattern and increasing endurance using one crutch outside the home and no AD inside the home occasionally. She is using knee brace for support and this seems to be helping with controlling pain as well when standing and walking. She has limitations of decreased strength, ROM and decreased ability to walk without difficulty and will require continued physical therapy intervention to achieve maximal function.    Rehab Potential Good   PT Frequency 2x / week   PT Duration 12 weeks   PT Treatment/Interventions DME Instruction;Gait training;Stair training;Therapeutic activities;Therapeutic exercise;Balance training;Neuromuscular re-education;Patient/family education;Manual techniques;Passive range of motion;Electrical Stimulation   PT Next Visit Plan focus on motor control, strengthening left hip, electrical stimulation russian stim. to left quadriceps   PT Home Exercise Plan continue with exercises as instructed and work on control of hip musculature for flexion, abduction, add isometri hip extension in neutral position in standing      Patient will benefit from skilled therapeutic intervention in order to improve the following deficits and impairments:  Abnormal gait, Decreased knowledge of use of DME, Decreased strength,  Difficulty walking, Impaired flexibility, Impaired sensation, Pain, Increased muscle spasms  Visit Diagnosis: Muscle weakness (generalized)  Difficulty in walking, not elsewhere classified     Problem List Patient Active Problem List   Diagnosis Date Noted  . Iron deficiency anemia 09/17/2016  . Status post total replacement of left hip 07/11/2016  . Chronic hypotension 07/02/2016  . Collapse of lung 07/02/2016  . H/O hepatitis 07/02/2016  . History of positive PPD 07/02/2016  . History of seizures as a child 07/02/2016  . Hypokalemia 07/02/2016  . Acquired dysplasia of left hip 04/19/2016  . Acquired dysplasia of right hip 04/19/2016  . 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 Hypothyroidism 07/17/2015  . Disorder of pelvis 07/17/2015  . OP (osteoporosis) 07/17/2015  . Lumbar radicular pain 08/04/2013  . Lumbar scoliosis 08/04/2013    Beacher May PT 10/10/2016, 4:26 PM  [place] PHYSICAL AND SPORTS MEDICINE [address], [place], [address] Phone: [phone]   Fax:  [phone]  Name:  Christine Herring MRN: [id number] Date of Birth: [birth year]

## 2016-10-14 ENCOUNTER — Ambulatory Visit: Payer: Managed Care, Other (non HMO) | Admitting: Physical Therapy

## 2016-10-14 ENCOUNTER — Telehealth: Payer: Self-pay | Admitting: Family Medicine

## 2016-10-14 ENCOUNTER — Other Ambulatory Visit: Payer: Self-pay | Admitting: Family Medicine

## 2016-10-14 DIAGNOSIS — D509 Iron deficiency anemia, unspecified: Secondary | ICD-10-CM

## 2016-10-14 NOTE — Telephone Encounter (Signed)
Orders cancelled for [facility] and reorder for [facility].

## 2016-10-14 NOTE — Telephone Encounter (Signed)
Please fax pt's lab order to [facility] on [address].  She is going tomorrow after [facility]

## 2016-10-15 ENCOUNTER — Encounter: Payer: Managed Care, Other (non HMO) | Admitting: Physical Therapy

## 2016-10-16 ENCOUNTER — Ambulatory Visit: Payer: Managed Care, Other (non HMO) | Admitting: Physical Therapy

## 2016-10-16 DIAGNOSIS — R262 Difficulty in walking, not elsewhere classified: Secondary | ICD-10-CM

## 2016-10-16 DIAGNOSIS — M6281 Muscle weakness (generalized): Secondary | ICD-10-CM | POA: Diagnosis not present

## 2016-10-16 LAB — CBC WITH DIFFERENTIAL/PLATELET
BASOS ABS: 0 10*3/uL (ref 0.0–0.2)
Basos: 1 %
EOS (ABSOLUTE): 0.2 10*3/uL (ref 0.0–0.4)
EOS: 2 %
HEMOGLOBIN: 11.5 g/dL (ref 11.1–15.9)
Hematocrit: 37.5 % (ref 34.0–46.6)
IMMATURE GRANS (ABS): 0 10*3/uL (ref 0.0–0.1)
Immature Granulocytes: 0 %
LYMPHS: 33 %
Lymphocytes Absolute: 2.6 10*3/uL (ref 0.7–3.1)
MCH: 22.9 pg — ABNORMAL LOW (ref 26.6–33.0)
MCHC: 30.7 g/dL — ABNORMAL LOW (ref 31.5–35.7)
MCV: 75 fL — ABNORMAL LOW (ref 79–97)
MONOCYTES: 6 %
Monocytes Absolute: 0.4 10*3/uL (ref 0.1–0.9)
Neutrophils Absolute: 4.6 10*3/uL (ref 1.4–7.0)
Neutrophils: 58 %
Platelets: 263 10*3/uL (ref 150–379)
RBC: 5.03 x10E6/uL (ref 3.77–5.28)
RDW: 16.8 % — ABNORMAL HIGH (ref 12.3–15.4)
WBC: 7.9 10*3/uL (ref 3.4–10.8)

## 2016-10-16 LAB — IRON AND TIBC
IRON SATURATION: 8 % — AB (ref 15–55)
IRON: 33 ug/dL (ref 27–159)
TIBC: 399 ug/dL (ref 250–450)
UIBC: 366 ug/dL (ref 131–425)

## 2016-10-17 ENCOUNTER — Encounter: Payer: Managed Care, Other (non HMO) | Admitting: Physical Therapy

## 2016-10-17 NOTE — Therapy (Signed)
[place] [facility] [address], [place], [address] Phone: [phone]   Fax:  [phone]  Physical Therapy Treatment  Patient Details  Name: Christine Herring MRN: [id number] Date of Birth: 1986-03-29 Referring Provider: Dr. Melvyn Neth  Encounter Date: 10/16/2016      PT End of Session - 10/16/16 1553    Visit Number 20   Number of Visits 25   Date for PT Re-Evaluation 10/25/16   PT Start Time 1532   PT Stop Time 1635   PT Time Calculation (min) 63 min   Equipment Utilized During Treatment Other (comment)  one axillary crutch   Activity Tolerance Patient tolerated treatment well;Patient limited by fatigue   Behavior During Therapy [facility] for tasks assessed/performed      Past Medical History:  Diagnosis Date  . Hip dysplasia   . Osteoporosis   . Thyroid disease     Past Surgical History:  Procedure Laterality Date  . TOTAL HIP ARTHROPLASTY Right   . TUBAL LIGATION      There were no vitals filed for this visit.      Subjective Assessment - 10/16/16 1533    Subjective Patient reports she got a good report from doctor visit and is released from precautions for THR left. She is still having difficulty with leaning forward to tie shoes and reports that the doctor told her she is now 3" longer on left leg than right and it will take more time for them to equal out.   Limitations Sitting;Standing;Walking   Patient Stated Goals to get back to normal in order to be able to perform daily tasks difficulty   Currently in Pain? No/denies      Objective: Gait; using one axillary crutch for support right UE, WBAT LLE with short controlled step length, improved trunk rotation   Treatment: Modalities: [place] electrical stimulation: 10/10 cycle; applied (2) electrodes to quadriceps/VMO left LE with patient supine lying with left LE supported on pillow with patient performing quad sets each cycle x .: Goal:  neuromuscular education; pain control  Manual therapy: side lying: assessed gluteus medius, maximus/piriformis and QL with + spasms noted in glute med primarily with + tenderness elicited, STM performed in conjunction with exercises for improving pain, flexibility  Therapeutic exercises: patient performed exercises with guidance/assistance, verbal,tactile cues and demonstration of therapist: Supine lying: Hook lying: ER isometrics bilaterally multiple angles x 5 repetitions followed by isometric hip adduction x 1 rep Sitting/standing:  Knee flexion with red resistive band x 25 reps Sit to stand from elevated surface x 10 reps with equal weight bearing each LE Standing balance/proprioception exercises:  Side stepping along airex balance beam x 2 min.  NuStep x . Independent: unbilled: level #3, post exercise with mild fatigue noted  Patient response to treatment: Patient demonstrated improved technique with exercises with minimal VC and assistance for correct alignment.  Patient with decreased spasms by 50% following STM. Improved motor control with repetition and cuing, following estim.       PT Education - 10/16/16 1621    Education provided Yes   Education Details HEP: continue with strengthening and flexibility exercises for left hip, hamstring curls with resistive band, hip ER, abduction side lying   Person(s) Educated Patient   Methods Explanation;Demonstration;Verbal cues   Comprehension Verbalized understanding;Returned demonstration;Verbal cues required             PT Long Term Goals - 08/02/16 1217      PT LONG  TERM GOAL #1   Title Pt will improve 5x STS to <10 seconds for improved functional mobility within 12 weeks.   Baseline 17.98 sec   Time 12   Period Weeks   Status New     PT LONG TERM GOAL #2   Title Pt will improve TUG to <10 seconds for improved functional mobility within 12 weeks with LRAD.   Baseline 27.01 sec with crutches   Time 12    Period Weeks   Status New     PT LONG TERM GOAL #3   Title Pt will score >1.0 m/s with LRAD on 10 MW test for community mobility and return to PLOF within 12 weeks.   Baseline 0.42 m/s with crutches   Time 12   Period Weeks   Status New     PT LONG TERM GOAL #4   Title Pt will improve LEFS score by 9 points for improved functional mobility within 12 weeks.   Baseline 14/80   Time 12   Period Weeks   Status New               Plan - 10/17/16 1852    Clinical Impression Statement Patient is progressing well with improving gait pattern, muscle control with all exercises. She continues with weakness in left hip/LE and will benefit from additional physical therapy intervention to achive goals    Rehab Potential Good   PT Frequency 2x / week   PT Duration 12 weeks   PT Treatment/Interventions DME Instruction;Gait training;Stair training;Therapeutic activities;Therapeutic exercise;Balance training;Neuromuscular re-education;Patient/family education;Manual techniques;Passive range of motion;Electrical Stimulation   PT Next Visit Plan focus on motor control, strengthening left hip, electrical stimulation russian stim. to left quadriceps   PT Home Exercise Plan continue with exercises as instructed and work on control of hip musculature for flexion, abduction, add isometri hip extension in neutral position in standing      Patient will benefit from skilled therapeutic intervention in order to improve the following deficits and impairments:  Abnormal gait, Decreased knowledge of use of DME, Decreased strength, Difficulty walking, Impaired flexibility, Impaired sensation, Pain, Increased muscle spasms  Visit Diagnosis: Muscle weakness (generalized)  Difficulty in walking, not elsewhere classified     Problem List Patient Active Problem List   Diagnosis Date Noted  . Iron deficiency anemia 09/17/2016  . Status post total replacement of left hip 07/11/2016  . Chronic hypotension  07/02/2016  . Collapse of lung 07/02/2016  . H/O hepatitis 07/02/2016  . History of positive PPD 07/02/2016  . History of seizures as a child 07/02/2016  . Hypokalemia 07/02/2016  . Acquired dysplasia of left hip 04/19/2016  . Acquired dysplasia of right hip 04/19/2016  . 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 Hypothyroidism 07/17/2015  . Disorder of pelvis 07/17/2015  . OP (osteoporosis) 07/17/2015  . Lumbar radicular pain 08/04/2013  . Lumbar scoliosis 08/04/2013    Beacher MayBrooks, Alaura Schippers PT 10/17/2016, 6:54 PM  [place] [facility] [address] [place], [place], [address] Phone: [phone]   Fax:  [phone]  Name: Christine Herring MRN: [id number] Date of Birth: 08/30/1986

## 2016-10-18 ENCOUNTER — Other Ambulatory Visit: Payer: Self-pay

## 2016-10-21 ENCOUNTER — Ambulatory Visit: Payer: Managed Care, Other (non HMO) | Admitting: Physical Therapy

## 2016-10-21 DIAGNOSIS — M6281 Muscle weakness (generalized): Secondary | ICD-10-CM

## 2016-10-21 DIAGNOSIS — R262 Difficulty in walking, not elsewhere classified: Secondary | ICD-10-CM

## 2016-10-22 ENCOUNTER — Encounter: Payer: Managed Care, Other (non HMO) | Admitting: Physical Therapy

## 2016-10-22 NOTE — Therapy (Signed)
[place] [facility] [address] [place], [place], [address] Phone: [phone]   Fax:  [phone]  Physical Therapy Treatment  Patient Details  Name: Christine HamSilvana Rollo MRN: [id number] Date of Birth: 01-19-1986 Referring Provider: Dr. Melvyn NethLewis  Encounter Date: 10/21/2016      PT End of Session - 10/21/16 1603    Visit Number 21   Number of Visits 25   Date for PT Re-Evaluation 10/25/16   PT Start Time 1533   PT Stop Time 1640   PT Time Calculation (min) 67 min   Equipment Utilized During Treatment Other (comment)  one axillary   Activity Tolerance Patient tolerated treatment well;Patient limited by fatigue   Behavior [facility] Therapy [facility] for tasks assessed/performed      Past Medical History:  Diagnosis Date  . Hip dysplasia   . Osteoporosis   . Thyroid disease     Past Surgical History:  Procedure Laterality Date  . TOTAL HIP ARTHROPLASTY Right   . TUBAL LIGATION      There were no vitals filed for this visit.      Subjective Assessment - 10/21/16 1552    Subjective Patient reports she did a lot over the weekend and was very fatigued and then had to use crutches Saturday evening into Sunday morning. She is fine today.   Limitations Sitting;Standing;Walking   Patient Stated Goals to get back to normal in order to be able to perform daily tasks difficulty   Currently in Pain? No/denies      Objective: Gait; using one axillary crutch for support right UE, WBAT LLE with short controlled step length, improved trunk rotation   Treatment: Modalities: [place] electrical stimulation: 10/10 cycle; applied (2) electrodes to quadriceps/VMO left LE with patient supine lying with left LE supported on pillow with patient performing quad sets each cycle x 20min.: Goal: neuromuscular education; pain control  Therapeutic exercises: patient performed exercises with guidance/assistance, verbal,tactile cues and  demonstration of therapist: Supine lying: Hook lying: with ball between knees; bridging x 15 Side lying: Clamshells with 3# weight on thigh x 5 reps, no weight x 10 reps Hip abduction with 3# above knee and assisted by therapist x 10 reps Sitting/standing:  Knee flexion with red resistive band x 25 reps Knee extension with 3# weight 2 x 15 reps Sit to stand from elevated surface x 10 reps with equal weight bearing each LE Standing balance/proprioception exercises:  Side stepping along airex balance beam x 2 min. Standing tapping with each LE onto balance beam x 10 (fatigued left LE after 7 reps)  NuStep x 10min. Independent: unbilled: level #3, post exercise with mildfatigue noted  Patient response to treatment: Patient demonstrated improved technique with exercises with minimal VC for correct alignment of hip, fatigue noted with all exercises.  Improved motor control with repetition and cuing, following estim.        PT Education - 10/21/16 1559    Education provided Yes   Education Details HEP: continue with strengthening and flexibility exercises for left hip, hamstring curls with resistive band   Person(s) Educated Patient   Methods Explanation;Demonstration;Verbal cues   Comprehension Verbalized understanding;Returned demonstration;Verbal cues required             PT Long Term Goals - 08/02/16 1217      PT LONG TERM GOAL #1   Title Pt will improve 5x STS to <10 seconds for improved functional mobility within 12 weeks.   Baseline 17.98 sec  Time 12   Period Weeks   Status New     PT LONG TERM GOAL #2   Title Pt will improve TUG to <10 seconds for improved functional mobility within 12 weeks with LRAD.   Baseline 27.01 sec with crutches   Time 12   Period Weeks   Status New     PT LONG TERM GOAL #3   Title Pt will score >1.0 m/s with LRAD on 10 MW test for community mobility and return to PLOF within 12 weeks.   Baseline 0.42 m/s with crutches   Time  12   Period Weeks   Status New     PT LONG TERM GOAL #4   Title Pt will improve LEFS score by 9 points for improved functional mobility within 12 weeks.   Baseline 14/80   Time 12   Period Weeks   Status New               Plan - 10/21/16 1646    Clinical Impression Statement Patient is progressing well with improving strength left LE and improved abiltiy to perform weight bearing/walking with less difficulty. She continues with limitations of decreased strength and endurance s/p left hip surgery and will require continued physical therapy intervention to achieve maximal gains and be able to transition to independent home program.    Rehab Potential Good   PT Frequency 2x / week   PT Duration 12 weeks   PT Treatment/Interventions DME Instruction;Gait training;Stair training;Therapeutic activities;Therapeutic exercise;Balance training;Neuromuscular re-education;Patient/family education;Manual techniques;Passive range of motion;Electrical Stimulation   PT Next Visit Plan focus on motor control, strengthening left hip, electrical stimulation russian stim. to left quadriceps   PT Home Exercise Plan continue with exercises as instructed and work on control of hip musculature for flexion, abduction, add isometri hip extension in neutral position in standing      Patient will benefit from skilled therapeutic intervention in order to improve the following deficits and impairments:  Abnormal gait, Decreased knowledge of use of DME, Decreased strength, Difficulty walking, Impaired flexibility, Impaired sensation, Pain, Increased muscle spasms  Visit Diagnosis: Muscle weakness (generalized)  Difficulty in walking, not elsewhere classified     Problem List Patient Active Problem List   Diagnosis Date Noted  . Iron deficiency anemia 09/17/2016  . Status post total replacement of left hip 07/11/2016  . Chronic hypotension 07/02/2016  . Collapse of lung 07/02/2016  . H/O hepatitis  07/02/2016  . History of positive PPD 07/02/2016  . History of seizures as a child 07/02/2016  . Hypokalemia 07/02/2016  . Acquired dysplasia of left hip 04/19/2016  . Acquired dysplasia of right hip 04/19/2016  . 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 Hypothyroidism 07/17/2015  . Disorder of pelvis 07/17/2015  . OP (osteoporosis) 07/17/2015  . Lumbar radicular pain 08/04/2013  . Lumbar scoliosis 08/04/2013    Beacher MayBrooks, Cayla Wiegand PT 10/22/2016, 11:01 AM  [place] [facility] [address] [place], [place], [address] Phone: [phone]   Fax:  [phone]  Name: Christine HamSilvana Risden MRN: [id number] Date of Birth: Jun 20, 1986

## 2016-10-23 ENCOUNTER — Encounter: Payer: Self-pay | Admitting: Physical Therapy

## 2016-10-23 ENCOUNTER — Ambulatory Visit: Payer: Managed Care, Other (non HMO) | Attending: Surgery | Admitting: Physical Therapy

## 2016-10-23 DIAGNOSIS — R262 Difficulty in walking, not elsewhere classified: Secondary | ICD-10-CM | POA: Insufficient documentation

## 2016-10-23 DIAGNOSIS — M6281 Muscle weakness (generalized): Secondary | ICD-10-CM | POA: Diagnosis not present

## 2016-10-23 NOTE — Therapy (Signed)
[place] [facility] [address] [place], [place], [address] Phone: [phone]   Fax:  [phone]  Physical Therapy Treatment  Patient Details  Name: Christine Herring MRN: [id number] Date of Birth: 01/25/1986 Referring Provider: Dr. Melvyn NethLewis  Encounter Date: 10/23/2016      PT End of Session - 10/23/16 1554    Visit Number 22   Number of Visits 25   Date for PT Re-Evaluation 10/25/16   PT Start Time 1534   PT Stop Time 1625   PT Time Calculation (min) 51 min   Equipment Utilized During Treatment Other (comment)  axillary   Activity Tolerance Patient tolerated treatment well;Patient limited by fatigue   Behavior During Therapy [facility] for tasks assessed/performed      Past Medical History:  Diagnosis Date  . Hip dysplasia   . Osteoporosis   . Thyroid disease     Past Surgical History:  Procedure Laterality Date  . TOTAL HIP ARTHROPLASTY Right   . TUBAL LIGATION      There were no vitals filed for this visit.      Subjective Assessment - 10/23/16 1550    Subjective Patient reports having increased soreness in left LE following previous session. She is fine today.   Limitations Sitting;Standing;Walking   Patient Stated Goals to get back to normal in order to be able to perform daily tasks difficulty   Currently in Pain? Yes   Pain Score 2    Pain Location Back   Pain Orientation Lower   Pain Descriptors / Indicators Discomfort  clicking   Pain Type Acute pain;Surgical pain  07/11/2016   Pain Onset More than a month ago   Pain Frequency Intermittent      Objective: Gait; using one axillary crutch for support right UE, WBAT LLE with improving step length and able to take 1-2 steps without AD   Treatment: Modalities: [place] electrical stimulation: 10/10 cycle; applied (2) electrodes to quadriceps/VMO left LE with patient supine lying with left LE supported on pillow with patient performing quad sets each  cycle; high volt estim. (2) electrodes applied over muscle spasms in left thigh anterior and lateral aspects x 15min.: Goal: neuromuscular education; pain control  Therapeutic exercises: patient performed exercises with guidance/assistance, verbal,tactile cues and demonstration of therapist: Supine lying: Hook lying: with ball between knees; bridging x 15 Side lying: Clamshells no weight x 10 reps Hip abduction with 3# above knee and assisted by therapist x 10 reps Sitting/standing:  Knee flexion with red resistive band x 25 reps Knee extension with 3# weight 2 x 15 reps Sit to stand from elevated surface x 10 reps with equal weight bearing each LE  NuStep x 10min. Independent: unbilled: level #3, post exercise with mildfatigue noted  Patient response to treatment: Patient demonstrated improved technique with exercises with minimal VC for correct alignment. Improved soft tissue elasticity in left thigh with less pain, stiffness following estim.; Improved motor control left LE with exercises with repetition and cuing, following estim.           PT Education - 10/23/16 1553    Education provided Yes   Education Details HEP: continue with weight shifting, balance and strengthening exercises as instructed   Person(s) Educated Patient   Methods Explanation;Demonstration;Verbal cues   Comprehension Verbalized understanding;Returned demonstration;Verbal cues required             PT Long Term Goals - 08/02/16 1217      PT LONG TERM  GOAL #1   Title Pt will improve 5x STS to <10 seconds for improved functional mobility within 12 weeks.   Baseline 17.98 sec   Time 12   Period Weeks   Status New     PT LONG TERM GOAL #2   Title Pt will improve TUG to <10 seconds for improved functional mobility within 12 weeks with LRAD.   Baseline 27.01 sec with crutches   Time 12   Period Weeks   Status New     PT LONG TERM GOAL #3   Title Pt will score >1.0 m/s with LRAD on 10  MW test for community mobility and return to PLOF within 12 weeks.   Baseline 0.42 m/s with crutches   Time 12   Period Weeks   Status New     PT LONG TERM GOAL #4   Title Pt will improve LEFS score by 9 points for improved functional mobility within 12 weeks.   Baseline 14/80   Time 12   Period Weeks   Status New               Plan - 10/23/16 1554    Clinical Impression Statement Patient is progressing well with improving strength left LE and improved abiltiy to perform weight bearing/walking with less difficulty. She continues with limitations of decreased strength, motor control and ambulation without AD and will benefit from additional physical therapy intervention to address limitations.    Rehab Potential Good   PT Frequency 2x / week   PT Duration 12 weeks   PT Treatment/Interventions DME Instruction;Gait training;Stair training;Therapeutic activities;Therapeutic exercise;Balance training;Neuromuscular re-education;Patient/family education;Manual techniques;Passive range of motion;Electrical Stimulation   PT Next Visit Plan focus on motor control, strengthening left hip, electrical stimulation russian stim. to left quadriceps   PT Home Exercise Plan continue with exercises as instructed and work on control of hip musculature for flexion, abduction, add isometri hip extension in neutral position in standing      Patient will benefit from skilled therapeutic intervention in order to improve the following deficits and impairments:  Abnormal gait, Decreased knowledge of use of DME, Decreased strength, Difficulty walking, Impaired flexibility, Impaired sensation, Pain, Increased muscle spasms  Visit Diagnosis: Muscle weakness (generalized)  Difficulty in walking, not elsewhere classified     Problem List Patient Active Problem List   Diagnosis Date Noted  . Iron deficiency anemia 09/17/2016  . Status post total replacement of left hip 07/11/2016  . Chronic  hypotension 07/02/2016  . Collapse of lung 07/02/2016  . H/O hepatitis 07/02/2016  . History of positive PPD 07/02/2016  . History of seizures as a child 07/02/2016  . Hypokalemia 07/02/2016  . Acquired dysplasia of left hip 04/19/2016  . Acquired dysplasia of right hip 04/19/2016  . 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 Hypothyroidism 07/17/2015  . Disorder of pelvis 07/17/2015  . OP (osteoporosis) 07/17/2015  . Lumbar radicular pain 08/04/2013  . Lumbar scoliosis 08/04/2013    Beacher MayBrooks, Arcenia Scarbro PT 10/24/2016, 7:42 PM  [place] [facility] [address] [place], [place], [address] Phone: [phone]   Fax:  [phone]  Name: Christine HamSilvana Herring MRN: [id number] Date of Birth: Sep 25, 1986

## 2016-10-24 ENCOUNTER — Encounter: Payer: Managed Care, Other (non HMO) | Admitting: Physical Therapy

## 2016-10-28 ENCOUNTER — Encounter: Payer: Self-pay | Admitting: Physical Therapy

## 2016-10-28 ENCOUNTER — Ambulatory Visit: Payer: Managed Care, Other (non HMO) | Admitting: Physical Therapy

## 2016-10-28 DIAGNOSIS — M6281 Muscle weakness (generalized): Secondary | ICD-10-CM

## 2016-10-28 DIAGNOSIS — R262 Difficulty in walking, not elsewhere classified: Secondary | ICD-10-CM

## 2016-10-28 NOTE — Therapy (Signed)
[place] [facility] [address] [place], [address] Phone: [phone]   Fax:  [phone]  Physical Therapy Treatment  Patient Details  Name: Christine Herring MRN: [id number] Date of Birth: 1986/11/16 Referring Provider: Dr. Melvyn Neth  Encounter Date: 10/28/2016      PT End of Session - 10/28/16 1557    Visit Number 23   Number of Visits 41   Date for PT Re-Evaluation 12/23/16   PT Start Time 1535   PT Stop Time 1630   PT Time Calculation (min) 55 min   Equipment Utilized During Treatment Other (comment)  axillary   Activity Tolerance Patient tolerated treatment well;Patient limited by fatigue   Behavior During Therapy [facility] for tasks assessed/performed      Past Medical History:  Diagnosis Date  . Hip dysplasia   . Osteoporosis   . Thyroid disease     Past Surgical History:  Procedure Laterality Date  . TOTAL HIP ARTHROPLASTY Right   . TUBAL LIGATION      There were no vitals filed for this visit.      Subjective Assessment - 10/28/16 1553    Subjective Patient reports increased tingling into left ankle and foot today. She did a lot of standing and walking over the weekend with shopping and feels this may be a contributing factor.    Limitations Sitting;Standing;Walking   Patient Stated Goals to get back to normal in order to be able to perform daily tasks difficulty   Currently in Pain? Yes   Pain Score 2    Pain Location Leg   Pain Orientation Medial;Lower   Pain Descriptors / Indicators Tingling   Pain Type Acute pain;Surgical pain  07/11/2016   Pain Onset More than a month ago   Pain Frequency Intermittent   Aggravating Factors  extensive activity involving standing, walking   Pain Relieving Factors rest   Effect of Pain on Daily Activities limits standing, walking for work, household chores, leisure activities ie walking, hiking      Objective: Gait; using one axillary crutch for support  right UE, WBAT LLE with improving step length and able to take 1-2 steps without AD Outcome measures: LEFS: 25/80 13 seconds: .75m/s 5x sit<>stand Strength:  Left hip abduction 3+/5, ER 3+/5   Treatment: Modalities: [place] electrical stimulation: 10/10 cycle; applied (2) electrodes to quadriceps/VMO left LE with patient supine lying with left LE supported on pillow with patient performing quad sets each cycle; high volt estim. (2) electrodes applied over muscle spasms in left thigh anterior and lateral aspects x .: Goal: neuromuscular education; pain control  Therapeutic exercises: patient performed exercises with guidance/assistance, verbal,tactile cues and demonstration of therapist: Side lying: Clamshells no weight x 10 reps Hip abduction  assisted by therapist x 10 reps Sitting/standing:  Step ups onto balance stones x 10 reps leading with each LE Side stepping on balance beam airex pad x 2 min. Staggered stance on balance stones weight shifting Outcome measure testing sit to stand x 5 reps  NuStep x . Independent: unbilled: level #3, post exercise with mildfatigue noted  Patient response to treatment: Patient demonstrated improved technique with exercises with minimal VC and assistance for correct alignment. Patient limited by fatigue for side lying exercises and step ups.         PT Education - 10/28/16 1556    Education provided Yes   Education Details HEP: use of heat and ice to manage symptoms left LE.  Person(s) Educated Patient   Methods Explanation   Comprehension Verbalized understanding             PT Long Term Goals - 10/28/16 1630      PT LONG TERM GOAL #1   Title Pt will improve 5x STS to <10 seconds for improved functional mobility by 12/23/2016   Baseline 17.98 sec; current 10/28/16 = 14.4 seconds   Status Revised     PT LONG TERM GOAL #2   Title Pt will improve TUG to <10 seconds for improved functional mobility  by 12/23/2016   Baseline 27.01 sec with crutches   Status Revised     PT LONG TERM GOAL #3   Title Pt will score >1.0 m/s with LRAD on 10 MW test for improved community mobility  by 1/1/ 2018   Baseline 0.42 m/s with crutches; current 13 seconds (.5440m/s: significant improvement: requires additional intervention)   Status Revised     PT LONG TERM GOAL #4   Title Pt will improve LEFS score to 45/80 by 12/23/2016 demonstrating improved functional mobility    Baseline 14/80; current 25/80 (significant change: goal achieved and goal revised)   Status Revised               Plan - 10/28/16 1558    Clinical Impression Statement Patient is progressing well with improving strength, endurance left LE with improved abiltiy to perform standing, walking activites. She continues with significant weakness and decresaed endurance left LE which limits function with standing/walking. She is progressing well with guided exercises and modalities for muscle re education and controlling pain, muscle spasms as she heals from THA. She will benefit from continued physical therapy to address limitations and achieve maximal function for daily tasks.    Rehab Potential Good   Clinical Impairments Affecting Rehab Potential (+) age, motivated, acute condition of recent left hip surgery  (-) hx of hip displasia, multiple surgeries both hips   PT Frequency 2x / week   PT Duration 8 weeks   PT Treatment/Interventions DME Instruction;Gait training;Stair training;Therapeutic activities;Therapeutic exercise;Balance training;Neuromuscular re-education;Patient/family education;Manual techniques;Passive range of motion;Electrical Stimulation   PT Next Visit Plan focus on motor control, strengthening left hip, electrical stimulation russian stim. to left quadriceps   PT Home Exercise Plan continue with exercises as instructed and work on control of hip musculature for flexion, abduction, add isometri hip extension in neutral  position in standing   Consulted and Agree with Plan of Care Patient      Patient will benefit from skilled therapeutic intervention in order to improve the following deficits and impairments:  Abnormal gait, Decreased knowledge of use of DME, Decreased strength, Difficulty walking, Impaired flexibility, Impaired sensation, Pain, Increased muscle spasms  Visit Diagnosis: Muscle weakness (generalized) - Plan: PT plan of care cert/re-cert  Difficulty in walking, not elsewhere classified - Plan: PT plan of care cert/re-cert     Problem List Patient Active Problem List   Diagnosis Date Noted  . Iron deficiency anemia 09/17/2016  . Status post total replacement of left hip 07/11/2016  . Chronic hypotension 07/02/2016  . Collapse of lung 07/02/2016  . H/O hepatitis 07/02/2016  . History of positive PPD 07/02/2016  . History of seizures as a child 07/02/2016  . Hypokalemia 07/02/2016  . Acquired dysplasia of left hip 04/19/2016  . Acquired dysplasia of right hip 04/19/2016  . 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 Hypothyroidism 07/17/2015  . Disorder of pelvis 07/17/2015  . OP (osteoporosis)  07/17/2015  . Lumbar radicular pain 08/04/2013  . Lumbar scoliosis 08/04/2013    Beacher MayBrooks, Tresean Mattix PT 10/29/2016, 8:57 PM  [place] [facility] [address] [place], [place], [address] Phone: [phone]   Fax:  [phone]  Name: Christine Herring MRN: [id number] Date of Birth: 1986-11-18

## 2016-10-29 ENCOUNTER — Encounter: Payer: Managed Care, Other (non HMO) | Admitting: Physical Therapy

## 2016-10-30 ENCOUNTER — Ambulatory Visit: Payer: Managed Care, Other (non HMO) | Admitting: Physical Therapy

## 2016-10-30 DIAGNOSIS — M6281 Muscle weakness (generalized): Secondary | ICD-10-CM

## 2016-10-30 DIAGNOSIS — R262 Difficulty in walking, not elsewhere classified: Secondary | ICD-10-CM

## 2016-10-31 ENCOUNTER — Encounter: Payer: Managed Care, Other (non HMO) | Admitting: Physical Therapy

## 2016-10-31 NOTE — Therapy (Signed)
[place] [facility] [address] [place], [place], [address] Phone: [phone]   Fax:  [phone]  Physical Therapy Treatment  Patient Details  Name: Christine HamSilvana Herring MRN: [id number] Date of Birth: 1986/02/25 Referring Provider: Dr. Melvyn NethLewis  Encounter Date: 10/30/2016      PT End of Session - 10/30/16 1810    Visit Number 24   Number of Visits 41   Date for PT Re-Evaluation 12/23/16   PT Start Time 1537   PT Stop Time 1630   PT Time Calculation (min) 53 min   Activity Tolerance Patient tolerated treatment well;Patient limited by fatigue   Behavior [facility] assessed/performed      Past Medical History:  Diagnosis Date  . Hip dysplasia   . Osteoporosis   . Thyroid disease     Past Surgical History:  Procedure Laterality Date  . TOTAL HIP ARTHROPLASTY Right   . TUBAL LIGATION      There were no vitals filed for this visit.      Subjective Assessment - 10/30/16 2016    Subjective Patient reports fatigue in left LE   Limitations Sitting;Standing;Walking   Patient Stated Goals to get back to normal in order to be able to perform daily tasks difficulty   Currently in Pain? No/denies      Objective; treatment: Modalities: [place] electrical stimulation: 10/10 cycle; applied (2) electrodes to quadriceps/VMO left LE with patient supine lying with left LE supported on pillow with patient performing quad sets each cycle; high volt estim. (2) electrodes applied over muscle spasms in left thigh anterior and lateral aspectsx 20min.: Goal: neuromuscular education; pain control  Therapeutic exercises: patient performed exercises with guidance/assistance, verbal,tactile cues and demonstration of therapist: Side lying: Clamshells no weight x 10 reps Hip abduction  assisted by therapist x 10 reps  NuStep x 10min. Independent: unbilled: level #3, post exercise with mildfatigue noted  Patient  response to treatment: Patient with improved quad control following estim., exercises improved with repetition and assistance. Fatigue left LE noted with all exercises          PT Education - 10/30/16 1810    Education provided Yes   Education Details HEP: continue with exercises for strengthening and use of heat/ice to control pain   Person(s) Educated Patient   Methods Explanation   Comprehension Verbalized understanding             PT Long Term Goals - 10/28/16 1630      PT LONG TERM GOAL #1   Title Pt will improve 5x STS to <10 seconds for improved functional mobility by 12/23/2016   Baseline 17.98 sec; current 10/28/16 = 14.4 seconds   Status Revised     PT LONG TERM GOAL #2   Title Pt will improve TUG to <10 seconds for improved functional mobility by 12/23/2016   Baseline 27.01 sec with crutches   Status Revised     PT LONG TERM GOAL #3   Title Pt will score >1.0 m/s with LRAD on 10 MW test for improved community mobility  by 1/1/ 2018   Baseline 0.42 m/s with crutches; current 13 seconds (.2475m/s: significant improvement: requires additional intervention)   Status Revised     PT LONG TERM GOAL #4   Title Pt will improve LEFS score to 45/80 by 12/23/2016 demonstrating improved functional mobility    Baseline 14/80; current 25/80 (significant change: goal achieved and goal revised)   Status Revised  Plan - 10/30/16 1818    Clinical Impression Statement Patient is progressing well with improving endurance, strength left LE. She continues with weakness, decreased endurance with standing and walking and will benefit from additional physical therpay intervention to achieve goals.    Rehab Potential Good   PT Frequency 2x / week   PT Duration 8 weeks   PT Treatment/Interventions DME Instruction;Gait training;Stair training;Therapeutic activities;Therapeutic exercise;Balance training;Neuromuscular re-education;Patient/family education;Manual  techniques;Passive range of motion;Electrical Stimulation   PT Next Visit Plan focus on motor control, strengthening left hip, electrical stimulation russian stim. to left quadriceps   PT Home Exercise Plan continue with exercises as instructed and work on control of hip musculature for flexion, abduction, add isometri hip extension in neutral position in standing      Patient will benefit from skilled therapeutic intervention in order to improve the following deficits and impairments:  Abnormal gait, Decreased knowledge of use of DME, Decreased strength, Difficulty walking, Impaired flexibility, Impaired sensation, Pain, Increased muscle spasms  Visit Diagnosis: Muscle weakness (generalized)  Difficulty in walking, not elsewhere classified     Problem List Patient Active Problem List   Diagnosis Date Noted  . Iron deficiency anemia 09/17/2016  . Status post total replacement of left hip 07/11/2016  . Chronic hypotension 07/02/2016  . Collapse of lung 07/02/2016  . H/O hepatitis 07/02/2016  . History of positive PPD 07/02/2016  . History of seizures as a child 07/02/2016  . Hypokalemia 07/02/2016  . Acquired dysplasia of left hip 04/19/2016  . Acquired dysplasia of right hip 04/19/2016  . 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 Hypothyroidism 07/17/2015  . Disorder of pelvis 07/17/2015  . OP (osteoporosis) 07/17/2015  . Lumbar radicular pain 08/04/2013  . Lumbar scoliosis 08/04/2013    Beacher MayBrooks, Hayley Horn PT 10/31/2016, 8:20 PM  [facility] [address] [place], [place], [address] Phone: [phone]   Fax:  [phone]  Name: Christine HamSilvana Herring MRN: [id number] Date of Birth: May 28, 1986

## 2016-11-04 ENCOUNTER — Encounter: Payer: Self-pay | Admitting: Physical Therapy

## 2016-11-04 ENCOUNTER — Ambulatory Visit: Payer: Managed Care, Other (non HMO) | Admitting: Physical Therapy

## 2016-11-04 DIAGNOSIS — M6281 Muscle weakness (generalized): Secondary | ICD-10-CM | POA: Diagnosis not present

## 2016-11-04 DIAGNOSIS — R262 Difficulty in walking, not elsewhere classified: Secondary | ICD-10-CM

## 2016-11-05 NOTE — Therapy (Signed)
[facility] [facility] [address] [place], [place], [address] Phone: [phone]   Fax:  [phone]  Physical Therapy Treatment  Patient Details  Name: Christine Herring MRN: [id number] Date of Birth: 21-Aug-1986 Referring Provider: Dr. Melvyn NethLewis  Encounter Date: 11/04/2016      PT End of Session - 11/04/16 1252    Visit Number 25   Number of Visits 41   Date for PT Re-Evaluation 12/23/16   PT Start Time 1226   PT Stop Time 1318   PT Time Calculation (min) 52 min   Activity Tolerance Patient tolerated treatment well;Patient limited by fatigue   Behavior During Therapy [facility] for tasks assessed/performed      Past Medical History:  Diagnosis Date  . Hip dysplasia   . Osteoporosis   . Thyroid disease     Past Surgical History:  Procedure Laterality Date  . TOTAL HIP ARTHROPLASTY Right   . TUBAL LIGATION      There were no vitals filed for this visit.      Subjective Assessment - 11/04/16 1249    Subjective Patient reports she is doing well and has just come from work. She is having some difficulty with having her work understand that she cannot tolerate working full time and performing all that is required of her due to surgery and weakness, decreased endurance.    Limitations Sitting;Standing;Walking   Patient Stated Goals to get back to normal in order to be able to perform daily tasks difficulty   Currently in Pain? No/denies      Objective; Gait: ambulating with one axillary crutch with close to equal weight shifting  Strength: left hip abduction 3+/5, ER 3-/5  Treatment: Modalities: [place] electrical stimulation: 10/10 cycle; applied (2) electrodes to quadriceps/VMO left LE with patient supine lying with left LE supported on pillow with patient performing quad sets each cycle; high volt estim. (2) electrodes applied over muscle spasms in left thigh anterior and lateral aspectsx 20min.: Goal: neuromuscular  education; pain control  Therapeutic exercises: patient performed exercises with guidance/assistance, verbal,tactile cues and demonstration of therapist: Side lying: Clamshells no weight x 10 reps Hip abduction assisted by therapist x 10 reps Hip abduction supine lying with end range isometrics hold x count of 5 x 10 reps Standing weight shifting on blue airex pad forward and back and side to side x 2 min. each  NuStep x 10min. Independent: unbilled: level #3, post exercise with mildfatigue noted  Patient response to treatment: Patient with improved motor control with repetition of exercise for ER of left hip, improved quad control/setting following electrical stimulation. Fatigue noted with ER and abduction exercises.         PT Education - 11/04/16 1251    Education provided Yes   Education Details HEP: strengthening left hip for abduction, ER.   Person(s) Educated Patient   Methods Explanation   Comprehension Verbalized understanding             PT Long Term Goals - 10/28/16 1630      PT LONG TERM GOAL #1   Title Pt will improve 5x STS to <10 seconds for improved functional mobility by 12/23/2016   Baseline 17.98 sec; current 10/28/16 = 14.4 seconds   Status Revised     PT LONG TERM GOAL #2   Title Pt will improve TUG to <10 seconds for improved functional mobility by 12/23/2016   Baseline 27.01 sec with crutches   Status Revised  PT LONG TERM GOAL #3   Title Pt will score >1.0 m/s with LRAD on 10 MW test for improved community mobility  by 1/1/ 2018   Baseline 0.42 m/s with crutches; current 13 seconds (.4734m/s: significant improvement: requires additional intervention)   Status Revised     PT LONG TERM GOAL #4   Title Pt will improve LEFS score to 45/80 by 12/23/2016 demonstrating improved functional mobility    Baseline 14/80; current 25/80 (significant change: goal achieved and goal revised)   Status Revised               Plan - 11/04/16 1318     Clinical Impression Statement Patient able to perform SLR x 1-2 repetitions with good control with lots of effort indicating weakness. She is progressing well towards all goals and should continue to improve with additional physical therapy intervention to address limitations.    Rehab Potential Good   PT Frequency 2x / week   PT Duration 8 weeks   PT Treatment/Interventions DME Instruction;Gait training;Stair training;Therapeutic activities;Therapeutic exercise;Balance training;Neuromuscular re-education;Patient/family education;Manual techniques;Passive range of motion;Electrical Stimulation   PT Next Visit Plan focus on motor control, strengthening left hip, electrical stimulation russian stim. to left quadriceps   PT Home Exercise Plan continue with exercises as instructed and work on control of hip musculature for flexion, abduction, add isometri hip extension in neutral position in standing      Patient will benefit from skilled therapeutic intervention in order to improve the following deficits and impairments:  Abnormal gait, Decreased knowledge of use of DME, Decreased strength, Difficulty walking, Impaired flexibility, Impaired sensation, Pain, Increased muscle spasms  Visit Diagnosis: Muscle weakness (generalized)  Difficulty in walking, not elsewhere classified     Problem List Patient Active Problem List   Diagnosis Date Noted  . Iron deficiency anemia 09/17/2016  . Status post total replacement of left hip 07/11/2016  . Chronic hypotension 07/02/2016  . Collapse of lung 07/02/2016  . H/O hepatitis 07/02/2016  . History of positive PPD 07/02/2016  . History of seizures as a child 07/02/2016  . Hypokalemia 07/02/2016  . Acquired dysplasia of left hip 04/19/2016  . Acquired dysplasia of right hip 04/19/2016  . 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 Hypothyroidism 07/17/2015  . Disorder of pelvis 07/17/2015  . OP (osteoporosis) 07/17/2015  .  Lumbar radicular pain 08/04/2013  . Lumbar scoliosis 08/04/2013    Beacher MayBrooks, Samanda Buske PT 11/05/2016, 4:55 PM  [place] [facility] [address] [place], [place], [address] Phone: [phone]   Fax:  [phone]  Name: Christine Herring MRN: [id number] Date of Birth: September 10, 1986

## 2016-11-06 ENCOUNTER — Ambulatory Visit: Payer: Managed Care, Other (non HMO) | Admitting: Physical Therapy

## 2016-11-08 ENCOUNTER — Encounter: Payer: Self-pay | Admitting: Physical Therapy

## 2016-11-08 ENCOUNTER — Ambulatory Visit: Payer: Managed Care, Other (non HMO) | Admitting: Physical Therapy

## 2016-11-08 DIAGNOSIS — M6281 Muscle weakness (generalized): Secondary | ICD-10-CM

## 2016-11-08 DIAGNOSIS — R262 Difficulty in walking, not elsewhere classified: Secondary | ICD-10-CM

## 2016-11-09 NOTE — Therapy (Signed)
[place] [facility] [address] [place], [place], [address] Phone: [phone]   Fax:  [phone]  Physical Therapy Treatment  Patient Details  Name: Christine Herring MRN: [id number] Date of Birth: 1986-04-12 Referring Provider: Dr. Melvyn NethLewis  Encounter Date: 11/08/2016      PT End of Session - 11/08/16 1347    Visit Number 26   Number of Visits 41   Date for PT Re-Evaluation 12/23/16   PT Start Time 1225   PT Stop Time 1320   PT Time Calculation (min) 55 min   Activity Tolerance Patient tolerated treatment well;Patient limited by fatigue   Behavior During Therapy [facility] for tasks assessed/performed      Past Medical History:  Diagnosis Date  . Hip dysplasia   . Osteoporosis   . Thyroid disease     Past Surgical History:  Procedure Laterality Date  . TOTAL HIP ARTHROPLASTY Right   . TUBAL LIGATION      There were no vitals filed for this visit.      Subjective Assessment - 11/08/16 1335    Subjective Patient reports she is doing well. She is still having difficulty with prolonged standing and walking activities due to weakness and decreased endurance in left LE.   Limitations Sitting;Standing;Walking   Patient Stated Goals to get back to normal in order to be able to perform daily tasks difficulty   Currently in Pain? No/denies      Objective; Telephone conference with Ross MarcusBonita Wellington regarding Christine Herring with her verbal consent and presence throughout the conversation: discussed best way possible to allow [name] to perform her work related duties fully: replied that they need to be patient and listen to her regarding what she is able to do and when she improves she will be able to perform more activities. Bonita verbalized understanding.  Treatment: Modalities: Gu[place]ian electrical stimulation: 10/10 cycle; applied (2) electrodes to quadriceps/VMO left LE with patient supine lying with left LE supported on  pillow with patient performing quad sets each cycle; high volt estim. (2) electrodes applied over muscle spasms in left thigh anterior and lateral aspectsx 20min.: Goal: neuromuscular education; pain control  Therapeutic exercises: patient performed exercises with guidance/assistance, verbal,tactile cues and demonstration of therapist: Hook lying:  Hip ER/abduction isometric with moderate resistance x 3 reps  Hip adduction with manual isometric resistance moderate intensity x 2 reps Hip abduction in sitting with manual resistance x 10 reps Standing side stepping along balance beam airex pad x 2 min. Lateral step ups with tandem stance x 15 reps each LE  Walk along balance beam with wide stance forward x 5 reps  NuStep x 10min. Independent: unbilled: level #3, post exercise with mildfatigue noted  Patient response to treatment: Patient demonstrated improved strength and endurance with exercises with assistance and VC. She performed exercises with improved motor control with repetition and following electrical stimulation.          PT Education - 11/08/16 1344    Education provided Yes   Education Details HEP: continue with strengthening exercises for hip abduction and ER.    Person(s) Educated Patient   Methods Explanation;Demonstration;Verbal cues   Comprehension Verbalized understanding;Returned demonstration;Verbal cues required             PT Long Term Goals - 10/28/16 1630      PT LONG TERM GOAL #1   Title Pt will improve 5x STS to <10 seconds for improved functional mobility by 12/23/2016  Baseline 17.98 sec; current 10/28/16 = 14.4 seconds   Status Revised     PT LONG TERM GOAL #2   Title Pt will improve TUG to <10 seconds for improved functional mobility by 12/23/2016   Baseline 27.01 sec with crutches   Status Revised     PT LONG TERM GOAL #3   Title Pt will score >1.0 m/s with LRAD on 10 MW test for improved community mobility  by 1/1/ 2018   Baseline  0.42 m/s with crutches; current 13 seconds (.3234m/s: significant improvement: requires additional intervention)   Status Revised     PT LONG TERM GOAL #4   Title Pt will improve LEFS score to 45/80 by 12/23/2016 demonstrating improved functional mobility    Baseline 14/80; current 25/80 (significant change: goal achieved and goal revised)   Status Revised               Plan - 11/08/16 1328    Clinical Impression Statement Patient demonstrated improved endurance and strength with all exercises. She continues with weakness in left LE and will require additional physical therapy intervention to achieve goals for improved function with walking and standing activities.    Rehab Potential Good   PT Frequency 2x / week   PT Duration 8 weeks   PT Treatment/Interventions DME Instruction;Gait training;Stair training;Therapeutic activities;Therapeutic exercise;Balance training;Neuromuscular re-education;Patient/family education;Manual techniques;Passive range of motion;Electrical Stimulation   PT Next Visit Plan focus on motor control, strengthening left hip, electrical stimulation russian stim. to left quadriceps   PT Home Exercise Plan continue with exercises as instructed and work on control of hip musculature for flexion, abduction, add isometri hip extension in neutral position in standing      Patient will benefit from skilled therapeutic intervention in order to improve the following deficits and impairments:  Abnormal gait, Decreased knowledge of use of DME, Decreased strength, Difficulty walking, Impaired flexibility, Impaired sensation, Pain, Increased muscle spasms  Visit Diagnosis: Muscle weakness (generalized)  Difficulty in walking, not elsewhere classified     Problem List Patient Active Problem List   Diagnosis Date Noted  . Iron deficiency anemia 09/17/2016  . Status post total replacement of left hip 07/11/2016  . Chronic hypotension 07/02/2016  . Collapse of lung  07/02/2016  . H/O hepatitis 07/02/2016  . History of positive PPD 07/02/2016  . History of seizures as a child 07/02/2016  . Hypokalemia 07/02/2016  . Acquired dysplasia of left hip 04/19/2016  . Acquired dysplasia of right hip 04/19/2016  . 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 Hypothyroidism 07/17/2015  . Disorder of pelvis 07/17/2015  . OP (osteoporosis) 07/17/2015  . Lumbar radicular pain 08/04/2013  . Lumbar scoliosis 08/04/2013    Beacher MayBrooks, Marie PT 11/09/2016, 6:26 PM  [place] [facility] [address] [place], [place], [address] Phone: [phone]   Fax:  [phone]  Name: Christine Herring MRN: [id number] Date of Birth: Jul 31, 1986

## 2016-11-11 ENCOUNTER — Encounter: Payer: Self-pay | Admitting: Physical Therapy

## 2016-11-11 ENCOUNTER — Ambulatory Visit: Payer: Managed Care, Other (non HMO) | Admitting: Physical Therapy

## 2016-11-11 DIAGNOSIS — M6281 Muscle weakness (generalized): Secondary | ICD-10-CM | POA: Diagnosis not present

## 2016-11-11 DIAGNOSIS — R262 Difficulty in walking, not elsewhere classified: Secondary | ICD-10-CM

## 2016-11-12 NOTE — Therapy (Signed)
[place] [facility] [address] [place], [place], [address] Phone: [phone]   Fax:  [phone]  Physical Therapy Treatment  Patient Details  Name: Christine HamSilvana Neuner MRN: [id number] Date of Birth: [birth year] Referring Provider: Dr. Melvyn NethLewis  Encounter Date: 11/11/2016      PT End of Session - 11/11/16 1350    Visit Number 27   Number of Visits 41   Date for PT Re-Evaluation 12/23/16   PT Start Time 1240   PT Stop Time 1341   PT Time Calculation (min) 61 min   Activity Tolerance Patient tolerated treatment well;Patient limited by fatigue   Behavior During Therapy [facility] for tasks assessed/performed      Past Medical History:  Diagnosis Date  . Hip dysplasia   . Osteoporosis   . Thyroid disease     Past Surgical History:  Procedure Laterality Date  . TOTAL HIP ARTHROPLASTY Right   . TUBAL LIGATION      There were no vitals filed for this visit.      Subjective Assessment - 11/11/16 1249    Subjective Patient reports she stood up for a while over the weekend and felt her leg muslces working.    Limitations Sitting;Standing;Walking   Patient Stated Goals to get back to normal in order to be able to perform daily tasks difficulty   Currently in Pain? No/denies      Objective; Gait; ambulating with one axillary crutch FWB left LE Strength: left LE hip flexion 3+/5, ER 3/5, abduction 3/5  Treatment: Modalities: [place]ian electrical stimulation: 10/10 cycle; applied (2) electrodes to quadriceps/VMO left LE with patient supine lying with left LE supported on pillow with patient performing quad sets each cycle; high volt estim. (2) electrodes applied over muscle spasms in left thigh anterior and lateral aspectsx 20min.: Goal: neuromuscular education; pain control  Therapeutic exercises: patient performed exercises with guidance/assistance, verbal,tactile cues and demonstration of therapist: Hook lying/supine/side  lying  Hip ER/abduction isometric with moderate resistance x 3 reps  Hip adduction with manual isometric resistance moderate intensity x 2 reps SLR with assistance x 5 reps with 50% max hold and eccentric controlled lowering Side lying hip abduction short arc with assistance and controlled lowering x 10 reps Standing side stepping along balance beam airex pad x 2 min. Step ups onto balance stones x 10 each leading with each LE Weight shift on staggered balance stones x 1 min each LE behind   NuStep x 10min. Independent: unbilled: level #3, post exercise with mildfatigue noted  Patient response to treatment: Patient response to treatment: patient demonstrated improved technique with exercises with moderate assistance and minimal VC for correct alignment. Improved motor control with repetition and cuing, following estim.            PT Education - 11/11/16 1322    Education provided Yes   Education Details HEP: work on Raytheonweight shifting in staggered stance and balance   Person(s) Educated Patient   Methods Explanation;Demonstration;Verbal cues   Comprehension Verbalized understanding;Returned demonstration;Verbal cues required             PT Long Term Goals - 10/28/16 1630      PT LONG TERM GOAL #1   Title Pt will improve 5x STS to <10 seconds for improved functional mobility by 12/23/2016   Baseline 17.98 sec; current 10/28/16 = 14.4 seconds   Status Revised     PT LONG TERM GOAL #2   Title Pt will improve TUG  to <10 seconds for improved functional mobility by 12/23/2016   Baseline 27.01 sec with crutches   Status Revised     PT LONG TERM GOAL #3   Title Pt will score >1.0 m/s with LRAD on 10 MW test for improved community mobility  by 1/1/ 2018   Baseline 0.42 m/s with crutches; current 13 seconds (.1171m/s: significant improvement: requires additional intervention)   Status Revised     PT LONG TERM GOAL #4   Title Pt will improve LEFS score to 45/80 by 12/23/2016  demonstrating improved functional mobility    Baseline 14/80; current 25/80 (significant change: goal achieved and goal revised)   Status Revised               Plan - 11/11/16 1345    Clinical Impression Statement Patient demonstrates steady improvement with exercises and continued estim. for muscle re educaiton and controlling muscle spasms. She has weakness in left LE hip musculature and decreased proprioceptive and kinesthetic awareness that should continue to improve with additional physical therapy intervention.    Rehab Potential Good   PT Frequency 2x / week   PT Duration 8 weeks   PT Treatment/Interventions DME Instruction;Gait training;Stair training;Therapeutic activities;Therapeutic exercise;Balance training;Neuromuscular re-education;Patient/family education;Manual techniques;Passive range of motion;Electrical Stimulation   PT Next Visit Plan focus on motor control, strengthening left hip, electrical stimulation russian stim. to left quadriceps   PT Home Exercise Plan continue with exercises as instructed and work on control of hip musculature for flexion, abduction, add isometri hip extension in neutral position in standing      Patient will benefit from skilled therapeutic intervention in order to improve the following deficits and impairments:  Abnormal gait, Decreased knowledge of use of DME, Decreased strength, Difficulty walking, Impaired flexibility, Impaired sensation, Pain, Increased muscle spasms  Visit Diagnosis: Muscle weakness (generalized)  Difficulty in walking, not elsewhere classified     Problem List Patient Active Problem List   Diagnosis Date Noted  . Iron deficiency anemia 09/17/2016  . Status post total replacement of left hip 07/11/2016  . Chronic hypotension 07/02/2016  . Collapse of lung 07/02/2016  . H/O hepatitis 07/02/2016  . History of positive PPD 07/02/2016  . History of seizures as a child 07/02/2016  . Hypokalemia 07/02/2016  .  Acquired dysplasia of left hip 04/19/2016  . Acquired dysplasia of right hip 04/19/2016  . 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 Hypothyroidism 07/17/2015  . Disorder of pelvis 07/17/2015  . OP (osteoporosis) 07/17/2015  . Lumbar radicular pain 08/04/2013  . Lumbar scoliosis 08/04/2013    Beacher MayBrooks, Lakecia Deschamps PT 11/12/2016, 4:49 PM  [place] [facility] [address] [place], [place], [address] Phone: [phone]   Fax:  [phone]  Name: Christine HamSilvana Neiss MRN: [id number] Date of Birth: [birth year]

## 2016-11-13 ENCOUNTER — Ambulatory Visit: Payer: Managed Care, Other (non HMO) | Admitting: Physical Therapy

## 2016-11-13 ENCOUNTER — Encounter: Payer: Self-pay | Admitting: Physical Therapy

## 2016-11-13 DIAGNOSIS — M6281 Muscle weakness (generalized): Secondary | ICD-10-CM

## 2016-11-13 DIAGNOSIS — R262 Difficulty in walking, not elsewhere classified: Secondary | ICD-10-CM

## 2016-11-14 NOTE — Therapy (Signed)
[place] [facility] [address] [place], [place], [address] Phone: [phone]   Fax:  [phone]  Physical Therapy Treatment  Patient Details  Name: Christine HamSilvana Herring MRN: [id number] Date of Birth: [birth year] Referring Provider: Dr. Melvyn NethLewis  Encounter Date: 11/13/2016      PT End of Session - 11/13/16 1315    Visit Number 28   Number of Visits 41   Date for PT Re-Evaluation 12/23/16   PT Start Time 1225   PT Stop Time 1300   PT Time Calculation (min) 35 min   Activity Tolerance Patient tolerated treatment well;Patient limited by fatigue   Behavior During Therapy [facility] for tasks assessed/performed      Past Medical History:  Diagnosis Date  . Hip dysplasia   . Osteoporosis   . Thyroid disease     Past Surgical History:  Procedure Laterality Date  . TOTAL HIP ARTHROPLASTY Right   . TUBAL LIGATION      There were no vitals filed for this visit.      Subjective Assessment - 11/13/16 1230    Subjective Patient reports she is doing well and feels stronger in left leg adn feels therapy is helping. She reports she is now able to reach left foot without difficulty and less stiffness in hip.    Limitations Sitting;Standing;Walking   Patient Stated Goals to get back to normal in order to be able to perform daily tasks difficulty   Currently in Pain? No/denies      Objective; Gait; ambulating with one axillary crutch FWB left LE  Treatment: Modalities: [place]ian electrical stimulation: 10/10 cycle; applied (2) electrodes to quadriceps/VMO left LE with patient supine lying with left LE supported on pillow with patient performing quad sets each cycle; high volt estim. (2) electrodes applied over muscle spasms in left thigh anterior and lateral aspectsx 20min.: Goal: neuromuscular education; pain control  Therapeutic exercises: patient performed exercises with guidance/assistance, verbal,tactile cues and demonstration  of therapist: Designer, television/film settair master x 2 min. For weight shifting, manual control setting Hook lying/supine/side lying  Hip ER/abduction isometric with moderate resistance x 3 reps  Hip adduction with manual isometric resistance moderate intensity x 2 reps Standing side stepping along balance beam airex pad x 2 min. Step ups onto balance stones x 10 each leading with each LE Seated knee extension with 3# weight x 15 Knee flexion against resistive band (red) x 25 reps  No NuStep performed today due to fatigue and patient having a full active day later this afternoon   Patient response to treatment: Patient demonstrated improved technique with exercises with minimal VC and assistance for correct alignment. Decreased strength noted with step ups. Mild fatigue noted with exercises. Moderate fatigue with stair master. Improved motor control, quad contraction with estim.          PT Education - 11/13/16 1245    Education provided Yes   Education Details HEP: continue with LE strengthening and weight shifting/balance exercises   Person(s) Educated Patient   Methods Explanation   Comprehension Verbalized understanding             PT Long Term Goals - 10/28/16 1630      PT LONG TERM GOAL #1   Title Pt will improve 5x STS to <10 seconds for improved functional mobility by 12/23/2016   Baseline 17.98 sec; current 10/28/16 = 14.4 seconds   Status Revised     PT LONG TERM GOAL #2   Title Pt will  improve TUG to <10 seconds for improved functional mobility by 12/23/2016   Baseline 27.01 sec with crutches   Status Revised     PT LONG TERM GOAL #3   Title Pt will score >1.0 m/s with LRAD on 10 MW test for improved community mobility  by 1/1/ 2018   Baseline 0.42 m/s with crutches; current 13 seconds (.6268m/s: significant improvement: requires additional intervention)   Status Revised     PT LONG TERM GOAL #4   Title Pt will improve LEFS score to 45/80 by 12/23/2016 demonstrating improved  functional mobility    Baseline 14/80; current 25/80 (significant change: goal achieved and goal revised)   Status Revised               Plan - 11/13/16 1310    Clinical Impression Statement Patient is progressing well with improvement noted in strength and flexibility in left LE and hip. she continues with weakness and limited ability to walk without AD and will benefit from continued physical therapy intervention.    Rehab Potential Good   PT Frequency 2x / week   PT Duration 8 weeks   PT Treatment/Interventions DME Instruction;Gait training;Stair training;Therapeutic activities;Therapeutic exercise;Balance training;Neuromuscular re-education;Patient/family education;Manual techniques;Passive range of motion;Electrical Stimulation   PT Next Visit Plan focus on motor control, strengthening left hip, electrical stimulation russian stim. to left quadriceps   PT Home Exercise Plan continue with exercises as instructed and work on control of hip musculature for flexion, abduction, add isometri hip extension in neutral position in standing      Patient will benefit from skilled therapeutic intervention in order to improve the following deficits and impairments:  Abnormal gait, Decreased knowledge of use of DME, Decreased strength, Difficulty walking, Impaired flexibility, Impaired sensation, Pain, Increased muscle spasms  Visit Diagnosis: Muscle weakness (generalized)  Difficulty in walking, not elsewhere classified     Problem List Patient Active Problem List   Diagnosis Date Noted  . Iron deficiency anemia 09/17/2016  . Status post total replacement of left hip 07/11/2016  . Chronic hypotension 07/02/2016  . Collapse of lung 07/02/2016  . H/O hepatitis 07/02/2016  . History of positive PPD 07/02/2016  . History of seizures as a child 07/02/2016  . Hypokalemia 07/02/2016  . Acquired dysplasia of left hip 04/19/2016  . Acquired dysplasia of right hip 04/19/2016  .  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 Hypothyroidism 07/17/2015  . Disorder of pelvis 07/17/2015  . OP (osteoporosis) 07/17/2015  . Lumbar radicular pain 08/04/2013  . Lumbar scoliosis 08/04/2013    Beacher MayBrooks, Marie PT 11/14/2016, 6:34 PM  [place] [facility] [address] [place], [place], [address] Phone: [phone]   Fax:  [phone]  Name: Christine HamSilvana Herring MRN: [id number] Date of Birth: [birth year]

## 2016-11-18 ENCOUNTER — Ambulatory Visit: Payer: Managed Care, Other (non HMO) | Admitting: Physical Therapy

## 2016-11-18 DIAGNOSIS — R262 Difficulty in walking, not elsewhere classified: Secondary | ICD-10-CM

## 2016-11-18 DIAGNOSIS — M6281 Muscle weakness (generalized): Secondary | ICD-10-CM

## 2016-11-19 NOTE — Therapy (Signed)
[facility] [address] [place], [place], [address] Phone: [phone]   Fax:  [phone]  Physical Therapy Treatment  Patient Details  Name: Christine HamSilvana Herring MRN: [id number] Date of Birth: Aug 29, 1986 Referring Provider: Dr. Melvyn NethLewis  Encounter Date: 11/18/2016      PT End of Session - 11/18/16 1324    Visit Number 29   Number of Visits 41   Date for PT Re-Evaluation 12/23/16   PT Start Time 1225   PT Stop Time 1315   PT Time Calculation (min) 50 min   Activity Tolerance Patient tolerated treatment well;Patient limited by fatigue   Behavior During Therapy [facility] for tasks assessed/performed      Past Medical History:  Diagnosis Date  . Hip dysplasia   . Osteoporosis   . Thyroid disease     Past Surgical History:  Procedure Laterality Date  . TOTAL HIP ARTHROPLASTY Right   . TUBAL LIGATION      There were no vitals filed for this visit.      Subjective Assessment - 11/18/16 1235    Subjective Patient reports she is doing well and feels stronger in left leg adn feels therapy is helping.    Limitations Sitting;Standing;Walking   Patient Stated Goals to get back to normal in order to be able to perform daily tasks difficulty   Currently in Pain? No/denies      Objective; Gait; ambulating with one axillary crutch FWB left LE; able to ambulate short distances without AD, slow cadence, mild deviations noted  Treatment: Modalities: [place]ian electrical stimulation: 10/10 cycle; applied (2) electrodes to quadriceps/VMO left LE with patient supine lying with left LE supported on pillow with patient performing quad sets each cycle; high volt estim. (2) electrodes applied over muscle spasms in left thigh anterior and lateral aspectsx 20min.: Goal: neuromuscular education; pain control  Therapeutic exercises: patient performed exercises with guidance/assistance, verbal,tactile cues and demonstration of  therapist: Designer, television/film settair master x 6 min. For weight shifting, manual control setting Hook lying/supine/side lying Hip ER/abduction isometric with moderate resistance x 3 reps  Hip adduction with manual isometric resistance moderate intensity x 2 reps Side lying hip abduction with assistance with hold end ROM and lower slowly x 10 reps Standing side stepping along balance beam airex pad x 2 min. Step ups onto balance stones x 10 each leading with each LE Calf raises through partial ROM onto balance stones x 15 reps Seated knee extension with 3# weight x 15 Knee flexion against resistive band (red) x 25 reps  Patient response to treatment: patient demonstrated improved technique with exercises with minimal VC for correct alignment.  Improved motor control with repetition and cuing, following estim. Mild to moderate fatigue with exercises         PT Education - 11/18/16 1323    Education provided Yes   Education Details HEP: weight shifting, calf raises, walk with normal gait pattern, work on quad control and hamstring curls sitting   Person(s) Educated Patient   Methods Explanation;Demonstration;Verbal cues   Comprehension Verbalized understanding;Returned demonstration;Verbal cues required             PT Long Term Goals - 10/28/16 1630      PT LONG TERM GOAL #1   Title Pt will improve 5x STS to <10 seconds for improved functional mobility by 12/23/2016   Baseline 17.98 sec; current 10/28/16 = 14.4 seconds   Status Revised     PT LONG TERM GOAL #2  Title Pt will improve TUG to <10 seconds for improved functional mobility by 12/23/2016   Baseline 27.01 sec with crutches   Status Revised     PT LONG TERM GOAL #3   Title Pt will score >1.0 m/s with LRAD on 10 MW test for improved community mobility  by 1/1/ 2018   Baseline 0.42 m/s with crutches; current 13 seconds (.1884m/s: significant improvement: requires additional intervention)   Status Revised     PT LONG TERM GOAL #4    Title Pt will improve LEFS score to 45/80 by 12/23/2016 demonstrating improved functional mobility    Baseline 14/80; current 25/80 (significant change: goal achieved and goal revised)   Status Revised               Plan - 11/18/16 1325    Clinical Impression Statement Patient demonstrates improvement with strength, motor control left LE and improved flexibility in hip which allows her to walk and dress with less difficulty. she continues with limitaations of strength, endurance and will require continued physical therapy intervention to achieve maximal gains for improved functional tasks.    Rehab Potential Good   PT Frequency 2x / week   PT Duration 8 weeks   PT Treatment/Interventions DME Instruction;Gait training;Stair training;Therapeutic activities;Therapeutic exercise;Balance training;Neuromuscular re-education;Patient/family education;Manual techniques;Passive range of motion;Electrical Stimulation   PT Next Visit Plan focus on motor control, strengthening left hip, electrical stimulation russian stim. to left quadriceps   PT Home Exercise Plan continue with exercises as instructed and work on control of hip musculature for flexion, abduction, add isometri hip extension in neutral position in standing      Patient will benefit from skilled therapeutic intervention in order to improve the following deficits and impairments:  Abnormal gait, Decreased knowledge of use of DME, Decreased strength, Difficulty walking, Impaired flexibility, Impaired sensation, Pain, Increased muscle spasms  Visit Diagnosis: Muscle weakness (generalized)  Difficulty in walking, not elsewhere classified     Problem List Patient Active Problem List   Diagnosis Date Noted  . Iron deficiency anemia 09/17/2016  . Status post total replacement of left hip 07/11/2016  . Chronic hypotension 07/02/2016  . Collapse of lung 07/02/2016  . H/O hepatitis 07/02/2016  . History of positive PPD 07/02/2016  .  History of seizures as a child 07/02/2016  . Hypokalemia 07/02/2016  . Acquired dysplasia of left hip 04/19/2016  . Acquired dysplasia of right hip 04/19/2016  . 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 Hypothyroidism 07/17/2015  . Disorder of pelvis 07/17/2015  . OP (osteoporosis) 07/17/2015  . Lumbar radicular pain 08/04/2013  . Lumbar scoliosis 08/04/2013    Beacher MayBrooks, Jester Klingberg PT 11/19/2016, 5:48 PM  [facility] [address] [place], [place], [address] Phone: [phone]   Fax:  [phone]  Name: Christine HamSilvana Herring MRN: [id number] Date of Birth: 22-Dec-1986

## 2016-11-20 ENCOUNTER — Ambulatory Visit: Payer: Managed Care, Other (non HMO) | Admitting: Physical Therapy

## 2016-11-20 DIAGNOSIS — M6281 Muscle weakness (generalized): Secondary | ICD-10-CM | POA: Diagnosis not present

## 2016-11-20 DIAGNOSIS — R262 Difficulty in walking, not elsewhere classified: Secondary | ICD-10-CM

## 2016-11-21 NOTE — Therapy (Signed)
[place] [facility] [address] [place], [place], [address] Phone: [phone]   Fax:  [phone]  Physical Therapy Treatment  Patient Details  Name: Christine Herring Vancamp MRN: [id number] Date of Birth: [birth year] Referring Provider: Dr. Melvyn NethLewis  Encounter Date: 11/20/2016      PT End of Session - 11/20/16 1315    Visit Number 30   Number of Visits 41   Date for PT Re-Evaluation 12/23/16   PT Start Time 1228   PT Stop Time 1315   PT Time Calculation (min) 47 min      Past Medical History:  Diagnosis Date  . Hip dysplasia   . Osteoporosis   . Thyroid disease     Past Surgical History:  Procedure Laterality Date  . TOTAL HIP ARTHROPLASTY Right   . TUBAL LIGATION      There were no vitals filed for this visit.      Subjective Assessment - 11/20/16 1233    Subjective Patient reports she is tired today and stiff in left hip.    Limitations Sitting;Standing;Walking   Patient Stated Goals to get back to normal in order to be able to perform daily tasks difficulty   Currently in Pain? No/denies      Objective; Gait; ambulating with one axillary crutch FWB left LE, slow cadence, decreased hip/knee flexion left LE  Treatment: Modalities: [place]ian electrical stimulation: 10/10 cycle; applied (2) electrodes to quadriceps/VMO left LE with patient supine lying with left LE supported on pillow with patient performing quad sets each cycle; high volt estim. (2) electrodes applied over muscle spasms in left thigh anterior and lateral aspectsx 20min.: Goal: neuromuscular education; pain control  Therapeutic exercises: patient performed exercises with guidance/assistance, verbal,tactile cues and demonstration of therapist: Side lying hip ER through short arc x 10 Side lying hip abduction with assistance of therapist isometric hold end range, eccentric lowering x 10 reps Hook lying/supine/side lying Hip ER/abduction  isometric with moderate resistance x 3 reps  Hip adduction with manual isometric resistance moderate intensity x 2 reps Standing side stepping along balance beam airex pad x 2 min. Step ups onto balance stones x 10 each leading with each LE Stair master x 5 min. For weight shifting, manual control setting   Patient response to treatment: Patient demonstrated improved flexibility in left hip and improved strength and motor control following estim. And with assistance during exercises. Patient demonstrated improved technique with exercises with minimal VC and assistance for correct alignment. Moderate fatigue today with exercises      PT Education - 11/20/16 1234    Education provided Yes   Education Details HEP: modify exercises to short arc and controlled motion to see improved strength and function   Person(s) Educated Patient   Methods Explanation;Demonstration;Verbal cues   Comprehension Verbalized understanding;Returned demonstration;Verbal cues required             PT Long Term Goals - 10/28/16 1630      PT LONG TERM GOAL #1   Title Pt will improve 5x STS to <10 seconds for improved functional mobility by 12/23/2016   Baseline 17.98 sec; current 10/28/16 = 14.4 seconds   Status Revised     PT LONG TERM GOAL #2   Title Pt will improve TUG to <10 seconds for improved functional mobility by 12/23/2016   Baseline 27.01 sec with crutches   Status Revised     PT LONG TERM GOAL #3   Title Pt will score >1.0  m/s with LRAD on 10 MW test for improved community mobility  by 1/1/ 2018   Baseline 0.42 m/s with crutches; current 13 seconds (.2326m/s: significant improvement: requires additional intervention)   Status Revised     PT LONG TERM GOAL #4   Title Pt will improve LEFS score to 45/80 by 12/23/2016 demonstrating improved functional mobility    Baseline 14/80; current 25/80 (significant change: goal achieved and goal revised)   Status Revised               Plan -  11/20/16 1320    Clinical Impression Statement Patient progressing well towards goals. continues with decreased strength and endurance and requires assistance for hip abduction and ER in side lying to perform with good alignment and appropriate ROM. She should continue to improve strength and endurance to achieve maximal funtion with household and community ambulation and daily tasks with continued physical therpay intervention.    Rehab Potential Good   PT Frequency 2x / week   PT Duration 8 weeks   PT Treatment/Interventions DME Instruction;Gait training;Stair training;Therapeutic activities;Therapeutic exercise;Balance training;Neuromuscular re-education;Patient/family education;Manual techniques;Passive range of motion;Electrical Stimulation   PT Next Visit Plan focus on motor control, strengthening left hip, electrical stimulation russian stim. to left quadriceps   PT Home Exercise Plan continue with exercises as instructed and work on control of hip musculature for flexion, abduction, isometri hip extension in neutral position in standing; short arc of motion for ER side lying      Patient will benefit from skilled therapeutic intervention in order to improve the following deficits and impairments:  Abnormal gait, Decreased knowledge of use of DME, Decreased strength, Difficulty walking, Impaired flexibility, Impaired sensation, Pain, Increased muscle spasms  Visit Diagnosis: Muscle weakness (generalized)  Difficulty in walking, not elsewhere classified     Problem List Patient Active Problem List   Diagnosis Date Noted  . Iron deficiency anemia 09/17/2016  . Status post total replacement of left hip 07/11/2016  . Chronic hypotension 07/02/2016  . Collapse of lung 07/02/2016  . H/O hepatitis 07/02/2016  . History of positive PPD 07/02/2016  . History of seizures as a child 07/02/2016  . Hypokalemia 07/02/2016  . Acquired dysplasia of left hip 04/19/2016  . Acquired dysplasia of  right hip 04/19/2016  . 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 Hypothyroidism 07/17/2015  . Disorder of pelvis 07/17/2015  . OP (osteoporosis) 07/17/2015  . Lumbar radicular pain 08/04/2013  . Lumbar scoliosis 08/04/2013    Beacher MayBrooks, Osten Janek PT 11/21/2016, 4:41 PM  [facility] [place] [facility] [address] [place], [place], [address] Phone: [phone]   Fax:  [phone]  Name: Christine Herring Cauble MRN: [id number] Date of Birth: 14-Oct-1986

## 2016-11-25 ENCOUNTER — Ambulatory Visit: Payer: Managed Care, Other (non HMO) | Admitting: Physical Therapy

## 2016-11-27 ENCOUNTER — Ambulatory Visit: Payer: Managed Care, Other (non HMO) | Attending: Surgery | Admitting: Physical Therapy

## 2016-11-27 ENCOUNTER — Encounter: Payer: Self-pay | Admitting: Physical Therapy

## 2016-11-27 DIAGNOSIS — M6281 Muscle weakness (generalized): Secondary | ICD-10-CM | POA: Diagnosis not present

## 2016-11-27 DIAGNOSIS — R262 Difficulty in walking, not elsewhere classified: Secondary | ICD-10-CM | POA: Insufficient documentation

## 2016-11-27 NOTE — Therapy (Signed)
[place] [facility] [address] [place], [place], [address] Phone: [phone]   Fax:  [phone]  Physical Therapy Treatment  Patient Details  Name: Christine Herring MRN: [id number] Date of Birth: 10/27/1986 Referring Provider: Dr. Melvyn NethLewis  Encounter Date: 11/27/2016      PT End of Session - 11/27/16 1314    Visit Number 31   Number of Visits 41   Date for PT Re-Evaluation 12/23/16   PT Start Time 1215   PT Stop Time 1312   PT Time Calculation (min) 57 min   Activity Tolerance Patient tolerated treatment well;Patient limited by fatigue   Behavior During Therapy [facility] for tasks assessed/performed      Past Medical History:  Diagnosis Date  . Hip dysplasia   . Osteoporosis   . Thyroid disease     Past Surgical History:  Procedure Laterality Date  . TOTAL HIP ARTHROPLASTY Right   . TUBAL LIGATION      There were no vitals filed for this visit.      Subjective Assessment - 11/27/16 1313    Subjective Patient reports right leg is hurting worse than left LE today.   Limitations Sitting;Standing;Walking   Patient Stated Goals to get back to normal in order to be able to perform daily tasks difficulty   Currently in Pain? No/denies      Objective: Gait; one axillary crutch, no AD with mild antalgic pattern   Treatment: Modalities: [place]ian electrical stimulation: 10/10 cycle; applied (2) electrodes to quadriceps/VMO left LE with patient supine lying with left LE supported on pillow with patient performing quad sets each cycle; high volt estim. (2) electrodes applied over muscle spasms in left thigh anterior and lateral aspectsx 20min.: Goal: neuromuscular education; pain control  Therapeutic exercises: patient performed exercises with guidance/assistance, verbal,tactile cues and demonstration of therapist: Side lying hip ER through short arc x 10 Side lying hip abduction with assistance of therapist isometric  hold end range, eccentric lowering x 10 reps Hook lying/supine/side lying Hip ER/abduction isometric with moderate resistance x 3 reps  Hip adduction with manual isometric resistance moderate intensity x 2 reps Sitting hip adduction with ball and glute sets x 10 Hip abduction with manual resistance sitting x 15 reps Stair master x 5 min. For weight shifting, manual control setting   Patient response to treatment: Patient demonstrated improved motor control left LE quads following estim. She required assistance and guidance to perform all exercises with correct alignment and technique. Moderate fatigue noted with all exercises.         PT Education - 11/27/16 1314    Education provided Yes   Education Details work on ER of both hips to improve gait pattern and strength   Person(s) Educated Patient   Methods Explanation;Demonstration;Verbal cues   Comprehension Verbalized understanding;Returned demonstration;Verbal cues required             PT Long Term Goals - 10/28/16 1630      PT LONG TERM GOAL #1   Title Pt will improve 5x STS to <10 seconds for improved functional mobility by 12/23/2016   Baseline 17.98 sec; current 10/28/16 = 14.4 seconds   Status Revised     PT LONG TERM GOAL #2   Title Pt will improve TUG to <10 seconds for improved functional mobility by 12/23/2016   Baseline 27.01 sec with crutches   Status Revised     PT LONG TERM GOAL #3   Title Pt will score >  1.0 m/s with LRAD on 10 MW test for improved community mobility  by 1/1/ 2018   Baseline 0.42 m/s with crutches; current 13 seconds (.5119m/s: significant improvement: requires additional intervention)   Status Revised     PT LONG TERM GOAL #4   Title Pt will improve LEFS score to 45/80 by 12/23/2016 demonstrating improved functional mobility    Baseline 14/80; current 25/80 (significant change: goal achieved and goal revised)   Status Revised               Plan - 11/27/16 1315    Clinical  Impression Statement Progressing well towards goals with improving strength and knowledge of appropriate exercises and progression. Improved motor control both LE's with treament today.   Rehab Potential Good   PT Frequency 2x / week   PT Duration 8 weeks   PT Treatment/Interventions DME Instruction;Gait training;Stair training;Therapeutic activities;Therapeutic exercise;Balance training;Neuromuscular re-education;Patient/family education;Manual techniques;Passive range of motion;Electrical Stimulation   PT Next Visit Plan focus on motor control, strengthening left hip, electrical stimulation russian stim. to left quadriceps   PT Home Exercise Plan continue with exercises as instructed and work on control of hip musculature for flexion, abduction, isometri hip extension in neutral position in standing; short arc of motion for ER side lying      Patient will benefit from skilled therapeutic intervention in order to improve the following deficits and impairments:  Abnormal gait, Decreased knowledge of use of DME, Decreased strength, Difficulty walking, Impaired flexibility, Impaired sensation, Pain, Increased muscle spasms  Visit Diagnosis: Muscle weakness (generalized)  Difficulty in walking, not elsewhere classified     Problem List Patient Active Problem List   Diagnosis Date Noted  . Iron deficiency anemia 09/17/2016  . Status post total replacement of left hip 07/11/2016  . Chronic hypotension 07/02/2016  . Collapse of lung 07/02/2016  . H/O hepatitis 07/02/2016  . History of positive PPD 07/02/2016  . History of seizures as a child 07/02/2016  . Hypokalemia 07/02/2016  . Acquired dysplasia of left hip 04/19/2016  . Acquired dysplasia of right hip 04/19/2016  . 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 Hypothyroidism 07/17/2015  . Disorder of pelvis 07/17/2015  . OP (osteoporosis) 07/17/2015  . Lumbar radicular pain 08/04/2013  . Lumbar scoliosis  08/04/2013    Beacher MayBrooks, Alyce Inscore PT 11/27/2016, 1:17 PM  [place] [facility] [address] [place], [place], [address] Phone: [phone]   Fax:  [phone]  Name: Christine Herring MRN: [id number] Date of Birth: 1986-01-11

## 2016-12-02 ENCOUNTER — Ambulatory Visit: Payer: Managed Care, Other (non HMO) | Admitting: Physical Therapy

## 2016-12-02 ENCOUNTER — Encounter: Payer: Self-pay | Admitting: Physical Therapy

## 2016-12-02 DIAGNOSIS — M6281 Muscle weakness (generalized): Secondary | ICD-10-CM | POA: Diagnosis not present

## 2016-12-02 DIAGNOSIS — R262 Difficulty in walking, not elsewhere classified: Secondary | ICD-10-CM

## 2016-12-03 NOTE — Therapy (Signed)
[facility]ICINE [address] [place], [place], [address] Phone: [phone]   Fax:  [phone]  Physical Therapy Treatment  Patient Details  Name: Christine Herring MRN: [id number] Date of Birth: 07/17/1986 Referring Provider: Dr. Melvyn NethLewis  Encounter Date: 12/02/2016      PT End of Session - 12/02/16 1320    Visit Number 32   Number of Visits 41   Date for PT Re-Evaluation 12/23/16   PT Start Time 1225   PT Stop Time 1315   PT Time Calculation (min) 50 min   Equipment Utilized During Treatment Other (comment)  one axillary crutch   Activity Tolerance Patient tolerated treatment well;Patient limited by fatigue   Behavior [facility] for tasks assessed/performed      Past Medical History:  Diagnosis Date  . Hip dysplasia   . Osteoporosis   . Thyroid disease     Past Surgical History:  Procedure Laterality Date  . TOTAL HIP ARTHROPLASTY Right   . TUBAL LIGATION      There were no vitals filed for this visit.      Subjective Assessment - 12/02/16 1251    Subjective Patient reports she is doing well with less pain in right LE today. She is noticing left leg is stronger and right leg is weak.    Limitations Sitting;Standing;Walking   Patient Stated Goals to get back to normal in order to be able to perform daily tasks difficulty   Currently in Pain? No/denies      Objective: Gait; one axillary crutch, no AD with mild antalgic pattern, IR of right hip noted with stance phase with collapsing of knee  Strength: Right LE hip ER and abduction 4-/5, knee flexion 4/5, knee extension 4/5 Left LE hip ER 4-/5, abduction 4/5, knee flexion 4-/5, knee extension 4-/5 Palpation; decreased soft tissue elasticity along lateral/anterior thigh proximal  Treatment: Modalities: [place]ian electrical stimulation: 10/10 cycle; applied (2) electrodes to quadriceps/VMO left LE with patient supine lying with left LE supported  on pillow with patient performing quad sets each cycle; high volt estim. (2) electrodes applied over muscle spasms in left thigh anterior and lateral aspectsx 20min.: Goal: neuromuscular education; pain control  Therapeutic exercises: patient performed exercises with guidance/assistance, verbal,tactile cues and demonstration of therapist: Side lying hip ER through short arc x 10, both LE's Side lying hip abduction with assistance of therapist isometric hold end range, eccentric lowering x 10 reps with 3# weight above knee Supine lying: assisted SLR with hold at partial ROM with controlled submaximal effort then lower with assistance 5-10 reps Hook lying/supine/side lying: Hip ER/abduction isometric with moderate resistance x 3 reps (note right LE with decreased strength) Hip adduction with manual isometric resistance moderate intensity x 2 reps Hip abduction with manual resistance sitting x 15 reps Stair master x 5min. For weight shifting, manual control setting level #1 (HR 120)    Patient response to treatment: Patient demonstrated improved quad control with SLR with assistance and VC, improved quad setting with estim. Improved endurance (increased from 3 to 5 min.) on stair master with improved weight shifting,  She required assistance and guidance to perform all exercises with correct alignment and technique. Moderate fatigue noted with all exercises.           PT Education - 12/02/16 1320    Education provided Yes   Education Details HEP: work on both LE's for ER and abduction and may use cane at home for support/balance,  crutch out in community until left LE is stronger in quadriceps   Person(s) Educated Patient   Methods Explanation;Demonstration   Comprehension Verbalized understanding;Returned demonstration             PT Long Term Goals - 10/28/16 1630      PT LONG TERM GOAL #1   Title Pt will improve 5x STS to <10 seconds for improved functional mobility by  12/23/2016   Baseline 17.98 sec; current 10/28/16 = 14.4 seconds   Status Revised     PT LONG TERM GOAL #2   Title Pt will improve TUG to <10 seconds for improved functional mobility by 12/23/2016   Baseline 27.01 sec with crutches   Status Revised     PT LONG TERM GOAL #3   Title Pt will score >1.0 m/s with LRAD on 10 MW test for improved community mobility  by 1/1/ 2018   Baseline 0.42 m/s with crutches; current 13 seconds (.4961m/s: significant improvement: requires additional intervention)   Status Revised     PT LONG TERM GOAL #4   Title Pt will improve LEFS score to 45/80 by 12/23/2016 demonstrating improved functional mobility    Baseline 14/80; current 25/80 (significant change: goal achieved and goal revised)   Status Revised               Plan - 12/02/16 1320    Clinical Impression Statement Patient continues to progress well towards goals and is working on strengthening both LE's to improve gait pattern. She continues with decreased quad control left LE and will therefore continue with axillary crutch out in community and begin using [facility] within the home as needed when fatigued for safety.    Rehab Potential Good   PT Frequency 2x / week   PT Duration 8 weeks   PT Treatment/Interventions DME Instruction;Gait training;Stair training;Therapeutic activities;Therapeutic exercise;Balance training;Neuromuscular re-education;Patient/family education;Manual techniques;Passive range of motion;Electrical Stimulation   PT Next Visit Plan focus on motor control, strengthening left hip, electrical stimulation russian stim. to left quadriceps   PT Home Exercise Plan continue with exercises as instructed and work on control of hip musculature for flexion, abduction, isometri hip extension in neutral position in standing; short arc of motion for ER side lying      Patient will benefit from skilled therapeutic intervention in order to improve the following deficits and impairments:  Abnormal  gait, Decreased knowledge of use of DME, Decreased strength, Difficulty walking, Impaired flexibility, Impaired sensation, Pain, Increased muscle spasms  Visit Diagnosis: Muscle weakness (generalized)  Difficulty in walking, not elsewhere classified     Problem List Patient Active Problem List   Diagnosis Date Noted  . Iron deficiency anemia 09/17/2016  . Status post total replacement of left hip 07/11/2016  . Chronic hypotension 07/02/2016  . Collapse of lung 07/02/2016  . H/O hepatitis 07/02/2016  . History of positive PPD 07/02/2016  . History of seizures as a child 07/02/2016  . Hypokalemia 07/02/2016  . Acquired dysplasia of left hip 04/19/2016  . Acquired dysplasia of right hip 04/19/2016  . 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 Hypothyroidism 07/17/2015  . Disorder of pelvis 07/17/2015  . OP (osteoporosis) 07/17/2015  . Lumbar radicular pain 08/04/2013  . Lumbar scoliosis 08/04/2013    Beacher MayBrooks, Marie PT 12/03/2016, 9:06 AM  [facility] [address] [place], [place], [address] Phone: [phone]   Fax:  [phone]  Name: Christine Herring MRN: [id number] Date of  Birth: 28-Aug-1986

## 2016-12-04 ENCOUNTER — Encounter: Payer: Self-pay | Admitting: *Deleted

## 2016-12-04 ENCOUNTER — Ambulatory Visit (INDEPENDENT_AMBULATORY_CARE_PROVIDER_SITE_OTHER): Payer: Managed Care, Other (non HMO) | Admitting: Family Medicine

## 2016-12-04 ENCOUNTER — Encounter: Payer: Self-pay | Admitting: Family Medicine

## 2016-12-04 ENCOUNTER — Ambulatory Visit: Payer: Managed Care, Other (non HMO) | Admitting: Physical Therapy

## 2016-12-04 VITALS — BP 98/68 | HR 70 | Temp 98.7°F | Resp 16 | Ht 64.0 in | Wt 165.0 lb

## 2016-12-04 DIAGNOSIS — R6889 Other general symptoms and signs: Secondary | ICD-10-CM | POA: Diagnosis not present

## 2016-12-04 DIAGNOSIS — J020 Streptococcal pharyngitis: Secondary | ICD-10-CM | POA: Diagnosis not present

## 2016-12-04 LAB — POCT RAPID STREP A (OFFICE): RAPID STREP A SCREEN: POSITIVE — AB

## 2016-12-04 LAB — POCT INFLUENZA A/B
INFLUENZA A, POC: NEGATIVE
INFLUENZA B, POC: NEGATIVE

## 2016-12-04 MED ORDER — AMOXICILLIN 500 MG PO CAPS: 500.0000 mg | ORAL_CAPSULE | Freq: Two times a day (BID) | ORAL | 0 refills | Status: DC

## 2016-12-04 NOTE — Patient Instructions (Signed)
Thank you for coming in to clinic today.  You have Strep Throat Pharyngitis, rapid strep swab test was POSITIVE today - Take antibiotic Amoxicillin 500mg  twice a day for 10 days, finish complete course  - Take regular NSAID with either Aleve 1-2 pills twice a day OR Ibuprofen 400 to 600mg  per dose with food every 6-8 hours or 3 times a day for next 3 to 5 days regularly, then as needed only - Take Tylenol 500-1000mg  per dose as needed every 8 hours or 3 times a day between for pain, fevers, or chills - Drink extra clear fluids (water, or G2 gatorade), try colder soft foods if needed otherwise regular diet - Drink warm herbal tea with honey for sore throat  Flu test was NEGATIVE.  Please schedule a follow-up appointment with Dr. Althea CharonKaramalegos in 1 week if any worsening or change in symptoms Sore Throat  If you have any other questions or concerns, please feel free to call the clinic or send a message through MyChart. You may also schedule an earlier appointment if necessary.  Christine Herring Sincere Liuzzi, DO [facility]

## 2016-12-04 NOTE — Progress Notes (Signed)
Subjective:    Patient ID: Christine Herring, female    DOB: [birth year], 30 y.o.   MRN: [id number]  Christine Herring is a 30 y.o. female presenting on 12/04/2016 for Cold Exposure (week ago but last night felt bodyche and had fever 101 chills ear pain HA feels like having no energy as per pt Dayquill is taken)  Patient presents for a same day appointment.  HPI   ACUTE PHARYNGITIS / FLU-LIKE SYMPTOMS Reports about 1 week ago had a bad URI cold with symptoms of sore throat, congestion, cough, and hoarse voice, tried OTC medications and symptoms resolved. Then yesterday new symptoms with generalized body aches, and also "stomach felt weird", then last night had both ear pain and pressure and then fevers last night (measured home temp with result up to 101F). Today when woke up back of throat now currently is soreness with swallowing, also describing generalized muscle soreness and aching. Worsening into today, but then took 2 hour nap today and felt a little better, after rest and taking DayQuil OTC med. - She was supposed to go to physical therapy today, but she had to re-schedule - Admits yesterday nausea (without vomiting) - Denies any active chills, sweats, coughing, nasal or sinus congestion, sneezing, diarrhea, dyspnea  Social History  Substance Use Topics  . Smoking status: Former Smoker    Packs/day: 0.50    Years: 10.00    Quit date: 07/16/2005  . Smokeless tobacco: Former NeurosurgeonUser  . Alcohol use Yes    Review of Systems Per HPI unless specifically indicated above     Objective:    BP 98/68   Pulse 70   Temp 98.7 F (37.1 C) (Oral)   Resp 16   Ht 5\' 4"  (1.626 m)   Wt 165 lb (74.8 kg)   SpO2 99%   BMI 28.32 kg/m   Wt Readings from Last 3 Encounters:  12/04/16 165 lb (74.8 kg)  09/17/16 165 lb (74.8 kg)  08/20/16 167 lb 9.6 oz (76 kg)    Physical Exam  Constitutional: She appears well-developed and well-nourished. No distress.  Well-appearing, comfortable, cooperative  HENT:   Head: Normocephalic and atraumatic.  Frontal / maxillary sinuses non-tender. Nares patent with mild congestion without purulence or edema. Bilateral TMs clear without erythema, effusion or bulging.  Oropharynx with significant posterior pharyngeal erythema and some edema without asymmetry, without tonsillar exudates or lesions.  Eyes: Conjunctivae are normal. Right eye exhibits no discharge. Left eye exhibits no discharge.  Neck: Normal range of motion. Neck supple. No thyromegaly present.  Cardiovascular: Normal rate, regular rhythm, normal heart sounds and intact distal pulses.   No murmur heard. Pulmonary/Chest: Effort normal and breath sounds normal. No respiratory distress. She has no wheezes. She has no rales.  Musculoskeletal: She exhibits no edema.  Lymphadenopathy:    She has cervical adenopathy (mild anterior LAD, non tender).  Neurological: She is alert.  Skin: Skin is warm and dry. No rash noted. She is not diaphoretic.  Psychiatric: Her behavior is normal.  Nursing note and vitals reviewed.  Results for orders placed or performed in visit on 12/04/16  POCT Influenza A/B  Result Value Ref Range   Influenza A, POC Negative Negative   Influenza B, POC Negative Negative  POCT rapid strep A  Result Value Ref Range   Rapid Strep A Screen Positive (A) Negative      Assessment & Plan:   Problem List Items Addressed This Visit    None  Visit Diagnoses    Acute streptococcal pharyngitis    -  Primary Concern for possible bacterial infection with initial URI then improvement now worsening within >1 week, with suspected acute strep throat infection based on history and exam, in setting of negative influenza testing today. No known strep or sick contacts. No other localized infection, TMs clear. Able to tolerate PO. Centor Score 4.  Plan: 1. Rapid strep Positive today. Decision to hold off on throat culture. 2. Start empiric antibiotics with Amoxicillin 500mg  BID x 10  days 3. Symptomatic control with NSAID / Tylenol regularly then PRN 4. Improve hydration, advance diet, warm herbal tea with honey 5. Follow-up within 1 week if worsening    Relevant Medications   amoxicillin (AMOXIL) 500 MG capsule   Other Relevant Orders   POCT rapid strep A (Completed)   Flu-like symptoms     - Concern with initial viral URI then improvement, now worsening. Constellation of symptoms initially suggestive of flu with generalized muscle aches, despite flu vaccine. - Influenza testing today in office NEGATIVE for A/B. Discussed possibility of false negative and empiric tamiflu, however given Rapid strep was positive, treat as strep - Supportive care     Relevant Orders   POCT Influenza A/B (Completed)      Meds ordered this encounter  Medications  . amoxicillin (AMOXIL) 500 MG capsule    Sig: Take 1 capsule (500 mg total) by mouth 2 (two) times daily. For 10 days    Dispense:  20 capsule    Refill:  0      Follow up plan: Return in about 1 week (around 12/11/2016), or if symptoms worsen or fail to improve, for strep throat / flu-like.  Saralyn PilarAlexander Xerxes Agrusa, DO [facility] 12/04/2016, 12:29 PM

## 2016-12-09 ENCOUNTER — Encounter: Payer: Self-pay | Admitting: Physical Therapy

## 2016-12-09 ENCOUNTER — Ambulatory Visit: Payer: Managed Care, Other (non HMO) | Admitting: Physical Therapy

## 2016-12-09 DIAGNOSIS — M6281 Muscle weakness (generalized): Secondary | ICD-10-CM | POA: Diagnosis not present

## 2016-12-09 DIAGNOSIS — R262 Difficulty in walking, not elsewhere classified: Secondary | ICD-10-CM

## 2016-12-09 NOTE — Therapy (Signed)
[place] [facility] [address] [place], [place], [address] Phone: [phone]   Fax:  [phone]  Physical Therapy Treatment  Patient Details  Name: Christine Herring MRN: [id number] Date of Birth: [birth year] Referring Provider: Dr. Melvyn NethLewis  Encounter Date: 12/09/2016      PT End of Session - 12/09/16 1300    Visit Number 33   Number of Visits 41   Date for PT Re-Evaluation 12/23/16   PT Start Time 1226   PT Stop Time 1310   PT Time Calculation (min) 44 min   Activity Tolerance Patient tolerated treatment well;Patient limited by fatigue   Behavior During Therapy [facility] for tasks assessed/performed      Past Medical History:  Diagnosis Date  . Hip dysplasia   . Osteoporosis   . Thyroid disease     Past Surgical History:  Procedure Laterality Date  . TOTAL HIP ARTHROPLASTY Right   . TUBAL LIGATION      There were no vitals filed for this visit.      Subjective Assessment - 12/09/16 1247    Subjective Patient reports she is recovering from Strep throat and is very tired today. She did not get a good night's sleep last evening.    Limitations Sitting;Standing;Walking   Patient Stated Goals to get back to normal in order to be able to perform daily tasks difficulty   Currently in Pain? No/denies        Objective: Gait; one axillary crutch FWB right LE Palpation; decreased soft tissue elasticity along lateral/anterior thigh proximal  Treatment: Modalities: [place]ian electrical stimulation: 10/10 cycle; applied (2) electrodes to quadriceps/VMO left LE with patient supine lying with left LE supported on pillow with patient performing quad sets each cycle; high volt estim. (2) electrodes applied over muscle spasms in left thigh anterior and lateral aspectsx 20min.: Goal: neuromuscular education; pain control  Therapeutic exercises: patient performed exercises with guidance/assistance, verbal,tactile cues and  demonstration of therapist: Side lying hip abduction with assistance of therapist isometric hold end range, eccentric lowering x 10 reps with no weight above knee Hook lying: Hip ER/abduction isometric with moderate resistance x 3 reps (note right LE with decreased strength) Hip adduction with manual isometric resistance moderate intensity x 2 reps  Patient response to treatment: Patient with fatigue with all exercises therefore modified with assistance with side lying hip abduction. Improved quad control with estim. P        PT Education - 12/09/16 1249    Education provided Yes   Education Details HEP: continue to work on ER and hip abduction    Person(s) Educated Patient   Methods Explanation   Comprehension Verbalized understanding             PT Long Term Goals - 10/28/16 1630      PT LONG TERM GOAL #1   Title Pt will improve 5x STS to <10 seconds for improved functional mobility by 12/23/2016   Baseline 17.98 sec; current 10/28/16 = 14.4 seconds   Status Revised     PT LONG TERM GOAL #2   Title Pt will improve TUG to <10 seconds for improved functional mobility by 12/23/2016   Baseline 27.01 sec with crutches   Status Revised     PT LONG TERM GOAL #3   Title Pt will score >1.0 m/s with LRAD on 10 MW test for improved community mobility  by 1/1/ 2018   Baseline 0.42 m/s with crutches; current 13 seconds (.4267m/s:  significant improvement: requires additional intervention)   Status Revised     PT LONG TERM GOAL #4   Title Pt will improve LEFS score to 45/80 by 12/23/2016 demonstrating improved functional mobility    Baseline 14/80; current 25/80 (significant change: goal achieved and goal revised)   Status Revised               Plan - 12/09/16 1252    Clinical Impression Statement Modified exercises due to patient fatigued from recovering from illness. Patient continues to improve with strength and endurance left LE, She conitnues with weakness in both LE's and  is still walking with axillary crutch for safety.    Rehab Potential Good   PT Frequency 2x / week   PT Duration 8 weeks   PT Treatment/Interventions DME Instruction;Gait training;Stair training;Therapeutic activities;Therapeutic exercise;Balance training;Neuromuscular re-education;Patient/family education;Manual techniques;Passive range of motion;Electrical Stimulation   PT Next Visit Plan focus on motor control, strengthening left hip, electrical stimulation russian stim. to left quadriceps   PT Home Exercise Plan continue with exercises as instructed and work on control of hip musculature for flexion, abduction, isometri hip extension in neutral position in standing; short arc of motion for ER side lying      Patient will benefit from skilled therapeutic intervention in order to improve the following deficits and impairments:  Abnormal gait, Decreased knowledge of use of DME, Decreased strength, Difficulty walking, Impaired flexibility, Impaired sensation, Pain, Increased muscle spasms  Visit Diagnosis: Muscle weakness (generalized)  Difficulty in walking, not elsewhere classified     Problem List Patient Active Problem List   Diagnosis Date Noted  . Iron deficiency anemia 09/17/2016  . Status post total replacement of left hip 07/11/2016  . Chronic hypotension 07/02/2016  . Collapse of lung 07/02/2016  . H/O hepatitis 07/02/2016  . History of positive PPD 07/02/2016  . History of seizures as a child 07/02/2016  . Hypokalemia 07/02/2016  . Acquired dysplasia of left hip 04/19/2016  . Acquired dysplasia of right hip 04/19/2016  . 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 Hypothyroidism 07/17/2015  . Disorder of pelvis 07/17/2015  . OP (osteoporosis) 07/17/2015  . Lumbar radicular pain 08/04/2013  . Lumbar scoliosis 08/04/2013    Christine Herring, Elmor Kost PT 12/10/2016, 5:58 PM  [place] [facility] [address] [place], [place], [address] Phone: [phone]   Fax:  [phone]  Name: Christine Herring MRN: [id number] Date of Birth: Sep 23, 1986

## 2016-12-11 ENCOUNTER — Ambulatory Visit: Payer: Managed Care, Other (non HMO) | Admitting: Physical Therapy

## 2016-12-11 DIAGNOSIS — M6281 Muscle weakness (generalized): Secondary | ICD-10-CM

## 2016-12-11 DIAGNOSIS — R262 Difficulty in walking, not elsewhere classified: Secondary | ICD-10-CM

## 2016-12-11 NOTE — Therapy (Signed)
[facility]IC[facility] [address] [place], [place], [address] Phone: [phone]   Fax:  [phone]  Physical Therapy Treatment  Patient Details  Name: Christine Herring MRN: [id number] Date of Birth: 1986/07/22 Referring Provider: Dr. Melvyn NethLewis  Encounter Date: 12/11/2016      PT End of Session - 12/11/16 1325    Visit Number [id number]   Number of Visits 41   Date for PT Re-Evaluation 12/23/16   PT Start Time 1223   PT Stop Time 1310   PT Time Calculation (min) 47 min   Activity Tolerance Patient tolerated treatment well;Patient limited by fatigue   Behavior [facility] for tasks assessed/performed      Past Medical History:  Diagnosis Date  . Hip dysplasia   . Osteoporosis   . Thyroid disease     Past Surgical History:  Procedure Laterality Date  . TOTAL HIP ARTHROPLASTY Right   . TUBAL LIGATION      There were no vitals filed for this visit.      Subjective Assessment - 12/11/16 1230    Subjective Patient reports she is feeling better today and is still getting stronger in left LE.    Limitations Sitting;Standing;Walking   Patient Stated Goals to get back to normal in order to be able to perform daily tasks difficulty   Currently in Pain? No/denies      Objective: Gait; one axillary crutch FWB left LE Palpation; decreased soft tissue elasticity along lateral/anterior thigh proximally right LE Strength: right LE hip abduction 3+/5, ER 3+/5  Treatment: Modalities: Guern[place]ian electrical stimulation: 10/10 cycle; applied (2) electrodes to quadriceps/VMO left LE with patient supine lying with left LE supported on pillow with patient performing quad sets each cycle; high volt estim. (2) electrodes applied over muscle spasms in left thigh anterior and lateral aspectsx 20min.: Goal: neuromuscular education; pain control  Therapeutic exercises: patient performed exercises with guidance/assistance, verbal,tactile  cues and demonstration of therapist: Side lying hip abduction with assistance of therapist isometric hold end range, eccentric lowering x 10 reps with no weight above knee Hook lying: Hip ER/abduction isometric with moderate resistance x 3 reps  Hip adduction with manual isometric resistance moderate intensity x 2 reps Bridging with ball between knees: 10 reps with 5 second holds at end range Standing: Calf raises off balance stones x 15 reps Step ups and lateral step ups x 15 reps each onto balance stones Stair master x 5 min level 1-2 with constant monitoring for weight shifting and correct positioning of feet/alignment of hip/knee/ankle  Patient response to treatment: Patient required assistance to perform all exercise with correct alignment of right LE. Moderate fatigue noted with all exercises. Improved endurance on stair master with increased time to 5 min.         PT Education - 12/11/16 1320    Education provided Yes   Education Details HEP : continue with exercises for strength and endurance   Person(s) Educated Patient   Methods Explanation   Comprehension Verbalized understanding             PT Long Term Goals - 10/28/16 1630      PT LONG TERM GOAL #1   Title Pt will improve 5x STS to <10 seconds for improved functional mobility by 12/23/2016   Baseline 17.98 sec; current 10/28/16 = 14.4 seconds   Status Revised     PT LONG TERM GOAL #2   Title Pt will improve TUG to <10  seconds for improved functional mobility by 12/23/2016   Baseline 27.01 sec with crutches   Status Revised     PT LONG TERM GOAL #3   Title Pt will score >1.0 m/s with LRAD on 10 MW test for improved community mobility  by 1/1/ 2018   Baseline 0.42 m/s with crutches; current 13 seconds (.8646m/s: significant improvement: requires additional intervention)   Status Revised     PT LONG TERM GOAL #4   Title Pt will improve LEFS score to 45/80 by 12/23/2016 demonstrating improved functional mobility     Baseline 14/80; current 25/80 (significant change: goal achieved and goal revised)   Status Revised               Plan - 12/11/16 1316    Clinical Impression Statement Patient continues to demonstrate improvement with strength and endurance as indicated by improved ability to perform exercise with less assistance and improved endurance with stair master. She continues with weakness in left hip musculature and quadriceps and should continue to improve with additional physical therapy intervention.    Rehab Potential Good   PT Frequency 2x / week   PT Duration 8 weeks   PT Treatment/Interventions DME Instruction;Gait training;Stair training;Therapeutic activities;Therapeutic exercise;Balance training;Neuromuscular re-education;Patient/family education;Manual techniques;Passive range of motion;Electrical Stimulation   PT Next Visit Plan focus on motor control, strengthening left hip, electrical stimulation russian stim. to left quadriceps   PT Home Exercise Plan continue with exercises as instructed and work on control of hip musculature for flexion, abduction, isometri hip extension in neutral position in standing; short arc of motion for ER side lying      Patient will benefit from skilled therapeutic intervention in order to improve the following deficits and impairments:  Abnormal gait, Decreased knowledge of use of DME, Decreased strength, Difficulty walking, Impaired flexibility, Impaired sensation, Pain, Increased muscle spasms  Visit Diagnosis: Muscle weakness (generalized)  Difficulty in walking, not elsewhere classified     Problem List Patient Active Problem List   Diagnosis Date Noted  . Iron deficiency anemia 09/17/2016  . Status post total replacement of left hip 07/11/2016  . Chronic hypotension 07/02/2016  . Collapse of lung 07/02/2016  . H/O hepatitis 07/02/2016  . History of positive PPD 07/02/2016  . History of seizures as a child 07/02/2016  .  Hypokalemia 07/02/2016  . Acquired dysplasia of left hip 04/19/2016  . Acquired dysplasia of right hip 04/19/2016  . 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 Hypothyroidism 07/17/2015  . Disorder of pelvis 07/17/2015  . OP (osteoporosis) 07/17/2015  . Lumbar radicular pain 08/04/2013  . Lumbar scoliosis 08/04/2013    Beacher MayBrooks, Tavius Turgeon PT 12/12/2016, 9:28 AM   [facility] [address] [place], [place], [address] Phone: [phone]   Fax:  [phone]  Name: Christine Herring MRN: [id number] Date of Birth: 1986-04-10

## 2016-12-13 ENCOUNTER — Encounter (HOSPITAL_COMMUNITY): Payer: Self-pay

## 2016-12-17 ENCOUNTER — Ambulatory Visit: Payer: Managed Care, Other (non HMO) | Admitting: Physical Therapy

## 2016-12-18 ENCOUNTER — Encounter: Payer: Managed Care, Other (non HMO) | Admitting: Physical Therapy

## 2016-12-19 ENCOUNTER — Ambulatory Visit (INDEPENDENT_AMBULATORY_CARE_PROVIDER_SITE_OTHER): Payer: 59 | Admitting: Licensed Clinical Social Worker

## 2016-12-19 ENCOUNTER — Ambulatory Visit: Payer: Managed Care, Other (non HMO) | Admitting: Physical Therapy

## 2016-12-19 DIAGNOSIS — F332 Major depressive disorder, recurrent severe without psychotic features: Secondary | ICD-10-CM | POA: Diagnosis not present
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
## 2016-12-25 ENCOUNTER — Ambulatory Visit: Payer: Managed Care, Other (non HMO) | Admitting: Physical Therapy

## 2016-12-30 ENCOUNTER — Ambulatory Visit: Payer: Managed Care, Other (non HMO) | Attending: Orthopedic Surgery | Admitting: Physical Therapy

## 2016-12-30 ENCOUNTER — Encounter: Payer: Self-pay | Admitting: Physical Therapy

## 2016-12-30 DIAGNOSIS — M6281 Muscle weakness (generalized): Secondary | ICD-10-CM

## 2016-12-30 DIAGNOSIS — R262 Difficulty in walking, not elsewhere classified: Secondary | ICD-10-CM

## 2016-12-30 NOTE — Therapy (Signed)
[facility] [address] [place], [address] Phone: [phone]   Fax:  [phone]  Physical Therapy Treatment  Patient Details  Name: Christine Herring MRN: [id number] Date of Birth: 1986/04/12 Referring Provider: Dr. Melvyn Neth  Encounter Date: 12/30/2016      PT End of Session - 12/30/16 1329    Visit Number 35   Number of Visits 57   Date for PT Re-Evaluation 02/24/17   PT Start Time 1220   PT Stop Time 1305   PT Time Calculation (min) 45 min   Activity Tolerance Patient tolerated treatment well;Patient limited by fatigue   Behavior During Therapy [facility] for tasks assessed/performed      Past Medical History:  Diagnosis Date  . Hip dysplasia   . Osteoporosis   . Thyroid disease     Past Surgical History:  Procedure Laterality Date  . TOTAL HIP ARTHROPLASTY Right   . TUBAL LIGATION      There were no vitals filed for this visit.      Subjective Assessment - 12/30/16 1223    Subjective Patient reports she is able to stand on her left leg now when standing still. Her right knee is now hurting.    Limitations Sitting;Standing;Walking   Patient Stated Goals to get back to normal in order to be able to perform daily tasks difficulty   Currently in Pain? No/denies      Objective: Gait; one axillary crutch FWB left LE; ambulating in the home without AD Palpation; decreased soft tissue elasticity along lateral/anterior thigh proximally left LE Strength:  left hip ER, abduction and extension; 3+/5 to 4-/5; right hip 3/5 Outcome measures: 12.3 seconds (.36m/s) in need of intervention LEFS 39/80 ( 80 = no self perceived disability) in need of intervention 5x sit to stand 13 seconds TUG 20 seconds using on axillary crutch single leg stand left LE 15 seconds, right LE <5 seconds  Treatment: Modalities: [place] electrical stimulation: 10/10 cycle; applied (2) electrodes to quadriceps/VMO left LE with  patient supine lying with left LE supported on pillow with patient performing quad sets each cycle; high volt estim. (2) electrodes applied over muscle spasms in left thigh anterior and lateral aspectsx .: Goal: neuromuscular education; pain control  Therapeutic exercises: patient performed exercises with guidance/assistance, verbal,tactile cues and demonstration of therapist: Side lying hip abduction with assistance of therapist isometric hold end range, eccentric lowering x 10 reps with noweight above knee Clamshell x 10 with manual resistance given at 10th repetition Prone lying: Hip extension x 10 with instruction to engage gluteal muscles and lift LE through short ROM with good control NuStep level 4 (8 min.) and 2  (2 min.) x 10 min.  Patient response to treatment: Patient demonstrated improved motor control with correct muscle recruitment with tactile and VC for hip abduction, ER and extension. Patient required assistance to perform all exercises with correct alignment of left LE. Moderate fatigue noted with exercises.            PT Education - 12/30/16 1301    Education provided Yes   Education Details HEP: add right LE for hip abduction and ER strengthening in addition to left LE; work on singl leg stand with hip abduction of opposite LE with controlled motion   Person(s) Educated Patient   Methods Explanation   Comprehension Verbalized understanding             PT Long Term Goals - 12/30/16 1332  PT LONG TERM GOAL #1   Title Pt will improve 5x STS to <10 seconds for improved functional mobility by 02/24/2017   Baseline 17.98 sec; current 10/28/16 = 14.4 seconds; 12/30/16 13 seconds     PT LONG TERM GOAL #2   Title Pt will improve TUG to <10 seconds for improved functional mobility by 02/24/2017   Baseline 27.01 sec with crutches; one crutch 20 seconds 12/30/2016   Status Revised     PT LONG TERM GOAL #3   Title Pt will score >1.0 m/s with LRAD on 10 MW test  for improved community mobility  by 3/5/ 2018   Baseline 0.42 m/s with crutches; current 13 seconds (.55m/s: significant improvement: requires additional intervention); 12/30/16 12.3 seconds (,4m/s: improving: still using one crutch)   Status Revised     PT LONG TERM GOAL #4   Title Pt will improve LEFS score to 50/80 by 02/24/2017 demonstrating improved functional mobility    Baseline 14/80; current 25/80 (significant change: goal achieved and goal revised); 12/30/16; 39/80 demonstrating significant improvement: new goal 50/80   Status Revised               Plan - 12/30/16 1330    Clinical Impression Statement Patient is progressing well with improvements noted in strength and function with walking and standing on left LE. She continues with decreased strength both hips and has to use AD to ambulate. She has an LEFS score of 39/80 indicating moderate self perceived disability. She requires guidance to progress exerciess appropriately in order to gain maximal functional return and be able to walk without AD/difficulty.    Rehab Potential Good   Clinical Impairments Affecting Rehab Potential (+) age, motivated, acute condition of recent left hip surgery  (-) hx of hip displasia, multiple surgeries both hips   PT Frequency 2x / week   PT Duration 8 weeks   PT Treatment/Interventions DME Instruction;Gait training;Stair training;Therapeutic activities;Therapeutic exercise;Balance training;Neuromuscular re-education;Patient/family education;Manual techniques;Passive range of motion;Electrical Stimulation   PT Next Visit Plan focus on motor control, strengthening left hip, electrical stimulation russian stim. to left quadriceps   PT Home Exercise Plan continue with exercises as instructed and work on control of hip musculature for flexion, abduction, isometri hip extension in neutral position in standing; short arc of motion for ER side lying   Consulted and Agree with Plan of Care Patient       Patient will benefit from skilled therapeutic intervention in order to improve the following deficits and impairments:  Abnormal gait, Decreased knowledge of use of DME, Decreased strength, Difficulty walking, Impaired flexibility, Impaired sensation, Pain, Increased muscle spasms  Visit Diagnosis: Difficulty in walking, not elsewhere classified - Plan: PT PLAN OF CARE CERT/RE-CERT  Muscle weakness (generalized) - Plan: PT PLAN OF CARE CERT/RE-CERT     Problem List Patient Active Problem List   Diagnosis Date Noted  . Iron deficiency anemia 09/17/2016  . Status post total replacement of left hip 07/11/2016  . Chronic hypotension 07/02/2016  . Collapse of lung 07/02/2016  . H/O hepatitis 07/02/2016  . History of positive PPD 07/02/2016  . History of seizures as a child 07/02/2016  . Hypokalemia 07/02/2016  . Acquired dysplasia of left hip 04/19/2016  . Acquired dysplasia of right hip 04/19/2016  . 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 Hypothyroidism 07/17/2015  . Disorder of pelvis 07/17/2015  . OP (osteoporosis) 07/17/2015  . Lumbar radicular pain 08/04/2013  . Lumbar scoliosis 08/04/2013    Shon Baton,  Michaelangelo Mittelman PT 12/30/2016, 4:38 PM  [facility] [facility] [address] [place], [place], [address] Phone: [phone]   Fax:  [phone]  Name: Maxcine HamSilvana Frame MRN: [id number] Date of Birth: May 12, 1986

## 2017-01-01 ENCOUNTER — Ambulatory Visit: Payer: Managed Care, Other (non HMO)

## 2017-01-01 DIAGNOSIS — R262 Difficulty in walking, not elsewhere classified: Secondary | ICD-10-CM

## 2017-01-01 DIAGNOSIS — M6281 Muscle weakness (generalized): Secondary | ICD-10-CM

## 2017-01-01 NOTE — Therapy (Signed)
[facility] [address] [place], [address] Phone: [phone]   Fax:  [phone]  Physical Therapy Treatment  Patient Details  Name: Christine Herring MRN: [id number] Date of Birth: Feb 22, 1986 Referring Provider: Dr. Melvyn Neth  Encounter Date: 01/01/2017      PT End of Session - 01/01/17 1222    Visit Number 36   Number of Visits 57   Date for PT Re-Evaluation 02/24/17   PT Start Time 1225   PT Stop Time 1315   PT Time Calculation (min) 50 min   Activity Tolerance Patient tolerated treatment well;Patient limited by fatigue   Behavior During Therapy [facility] for tasks assessed/performed      Past Medical History:  Diagnosis Date  . Hip dysplasia   . Osteoporosis   . Thyroid disease     Past Surgical History:  Procedure Laterality Date  . TOTAL HIP ARTHROPLASTY Right   . TUBAL LIGATION      There were no vitals filed for this visit.      Subjective Assessment - 01/01/17 1221    Subjective Pt reports she is doing well today. She has no specific questions or concerns and denies pain currently.    Limitations Sitting;Standing;Walking   Patient Stated Goals to get back to normal in order to be able to perform daily tasks difficulty   Currently in Pain? No/denies            Objective:  Observed pt ambulation in rehab gym with one axillary crutch FWB left LE. Notable R ankle and knee valgus during ambulation.   Treatment:  Modalities [place] electrical stimulation: 10/10 cycle; applied (2) electrodes to quadriceps/VMO left LE with patient supine lying with left LE supported on pillow with patient performing quad sets each cycle; high volt estim. (2) electrodes applied over muscle spasms in left thigh anterior and lateral aspectsx .: Goal: neuromuscular education; pain control  Therapeutic exercises Patient performed exercises with guidance/assistance, verbal,tactile cues and demonstration  of therapist:  R side lying L hip abduction with assistance of therapist isometric hold end range, eccentric lowering x 10 reps with noweight above knee  R sidelying L hip abduction circles clockwise x 10, counterclockwise x 10;   Hook lying: Hip ER/abduction isometric with moderate resistance x 10 reps  Hip adduction with manual isometric resistance moderate intensity x 10 reps L single leg bridge between knees:10 reps with 5 second holds at end range  Standing: Calf raises off balance stones x 15 reps Step ups and lateral step ups x 15 reps each onto balance stones Stair master x 5 min level 1-2 with constant monitoring for weight shifting and correct positioning of feet/alignment of hip/knee/ankle  Patient response to treatment: Patient required assistance to perform all exercise with correct alignment of right LE. Moderate fatigue noted with all exercises. Improved endurance on stair master with increased time to 5 min.                         PT Education - 01/01/17 1222    Education provided Yes   Education Details HEP reinforced   Person(s) Educated Patient   Methods Explanation   Comprehension Verbalized understanding             PT Long Term Goals - 12/30/16 1332      PT LONG TERM GOAL #1   Title Pt will improve 5x STS to <10 seconds for improved functional mobility  by 02/24/2017   Baseline 17.98 sec; current 10/28/16 = 14.4 seconds; 12/30/16 13 seconds     PT LONG TERM GOAL #2   Title Pt will improve TUG to <10 seconds for improved functional mobility by 02/24/2017   Baseline 27.01 sec with crutches; one crutch 20 seconds 12/30/2016   Status Revised     PT LONG TERM GOAL #3   Title Pt will score >1.0 m/s with LRAD on 10 MW test for improved community mobility  by 3/5/ 2018   Baseline 0.42 m/s with crutches; current 13 seconds (.7874m/s: significant improvement: requires additional intervention); 12/30/16 12.3 seconds (,4842m/s: improving: still using  one crutch)   Status Revised     PT LONG TERM GOAL #4   Title Pt will improve LEFS score to 50/80 by 02/24/2017 demonstrating improved functional mobility    Baseline 14/80; current 25/80 (significant change: goal achieved and goal revised); 12/30/16; 39/80 demonstrating significant improvement: new goal 50/80   Status Revised               Plan - 01/01/17 1222    Clinical Impression Statement Pt appears to be progressing iwth her hip strength based on prior session. She demonstrates ability to perform full L hip abduction lift as well as perform isometric holds and circles. Pt encouraged to continue HEP and follow-up as scheduled.    Rehab Potential Good   Clinical Impairments Affecting Rehab Potential (+) age, motivated, acute condition of recent left hip surgery  (-) hx of hip displasia, multiple surgeries both hips   PT Frequency 2x / week   PT Duration 8 weeks   PT Treatment/Interventions DME Instruction;Gait training;Stair training;Therapeutic activities;Therapeutic exercise;Balance training;Neuromuscular re-education;Patient/family education;Manual techniques;Passive range of motion;Electrical Stimulation   PT Next Visit Plan focus on motor control, strengthening left hip, electrical stimulation russian stim. to left quadriceps   PT Home Exercise Plan continue with exercises as instructed and work on control of hip musculature for flexion, abduction, isometri hip extension in neutral position in standing; short arc of motion for ER side lying   Consulted and Agree with Plan of Care Patient      Patient will benefit from skilled therapeutic intervention in order to improve the following deficits and impairments:  Abnormal gait, Decreased knowledge of use of DME, Decreased strength, Difficulty walking, Impaired flexibility, Impaired sensation, Pain, Increased muscle spasms  Visit Diagnosis: Muscle weakness (generalized)  Difficulty in walking, not elsewhere  classified     Problem List Patient Active Problem List   Diagnosis Date Noted  . Iron deficiency anemia 09/17/2016  . Status post total replacement of left hip 07/11/2016  . Chronic hypotension 07/02/2016  . Collapse of lung 07/02/2016  . H/O hepatitis 07/02/2016  . History of positive PPD 07/02/2016  . History of seizures as a child 07/02/2016  . Hypokalemia 07/02/2016  . Acquired dysplasia of left hip 04/19/2016  . Acquired dysplasia of right hip 04/19/2016  . 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 Hypothyroidism 07/17/2015  . Disorder of pelvis 07/17/2015  . OP (osteoporosis) 07/17/2015  . Lumbar radicular pain 08/04/2013  . Lumbar scoliosis 08/04/2013    Lynnea MaizesJason D Alexy Bringle PT, DPT   Keierra Nudo 01/01/2017, 1:24 PM  [facility] [address] , [place], [address] Phone: [phone]   Fax:  [phone]  Name: Maxcine HamSilvana Bobby MRN: [id number] Date of Birth: [birth year]

## 2017-01-06 ENCOUNTER — Ambulatory Visit: Payer: Managed Care, Other (non HMO) | Admitting: Physical Therapy

## 2017-01-08 ENCOUNTER — Ambulatory Visit: Payer: Managed Care, Other (non HMO) | Admitting: Physical Therapy

## 2017-01-09 ENCOUNTER — Ambulatory Visit: Payer: Managed Care, Other (non HMO) | Admitting: Licensed Clinical Social Worker

## 2017-01-13 ENCOUNTER — Encounter: Payer: Self-pay | Admitting: Physical Therapy

## 2017-01-13 ENCOUNTER — Ambulatory Visit: Payer: Managed Care, Other (non HMO) | Admitting: Physical Therapy

## 2017-01-13 DIAGNOSIS — R262 Difficulty in walking, not elsewhere classified: Secondary | ICD-10-CM

## 2017-01-13 DIAGNOSIS — M6281 Muscle weakness (generalized): Secondary | ICD-10-CM

## 2017-01-14 NOTE — Therapy (Signed)
[facility] [address] [place], [address] Phone: [phone]   Fax:  [phone]  Physical Therapy Treatment  Patient Details  Name: Christine Herring MRN: [id number] Date of Birth: 05-29-1986 Referring Provider: Dr. Melvyn Neth  Encounter Date: 01/13/2017      PT End of Session - 01/13/17 1216    Visit Number 37   Number of Visits 57   Date for PT Re-Evaluation 02/24/17   PT Start Time 1210   PT Stop Time 1303   PT Time Calculation (min) 53 min   Activity Tolerance Patient tolerated treatment well;Patient limited by fatigue   Behavior During Therapy [facility] for tasks assessed/performed      Past Medical History:  Diagnosis Date  . Hip dysplasia   . Osteoporosis   . Thyroid disease     Past Surgical History:  Procedure Laterality Date  . TOTAL HIP ARTHROPLASTY Right   . TUBAL LIGATION      There were no vitals filed for this visit.      Subjective Assessment - 01/13/17 1213    Subjective Patient reports she was seen by surgeon and she is doing well and now her left LE is her strong leg. X rays reviewed by surgeon and now right hip is internally rotated and she is ok to exercise and walk with hip in IR if not having knee symptoms. She is planning right hip surgery soon to correct hip alignment.    Limitations Sitting;Standing;Walking   Patient Stated Goals to get back to normal in order to be able to perform daily tasks difficulty   Currently in Pain? No/denies      Objective: Gait; one axillary crutch FWB leftLE  Treatment: Therapeutic exercises: patient performed exercises with guidance/assistance, verbal,tactile cues and demonstration of therapist: Sitting: Knee extension 3# weights on ankles 2 x 15 Knee flexion blue resistive band 2 x 25 (left LE second set 20 reps Hip abduction with manual resistance x 15 reps Hip adduction with ball and glute sets x 15 reps Standing: Walk side stepping  along balance beam x 2 min Heel raises x 15 reps, 3 sets Running man with one leg on balance beam and support of UE on counter x 10 reps,  3 sets Walk on toes x 1 min. Forward only Forward and backwards walking along counter for support/balance x 2 min. Stair master x 5 min. Level 1-2 with fatigue in LE's and endurance (note: positioned right LE in IR at hip intermittently to avoid knee strain)  Patient response to treatment: Patient demonstrated improved technique with exercises with minimal VC for correct alignment; required support of UE on counter to perform single leg exercises. Improved motor control with repetition and cuing; patient with increased hip "popping" in left hip with hip flexion/extension through full ROM therefore modified through short arc of motion with decreased popping noted. Mild to moderate fatigue noted following session            PT Education - 01/13/17 1214    Education provided Yes   Education Details HEP: foward, backward walking, toe walking and single leg standing    Person(s) Educated Patient   Methods Explanation;Demonstration;Verbal cues   Comprehension Verbalized understanding;Returned demonstration;Verbal cues required             PT Long Term Goals - 12/30/16 1332      PT LONG TERM GOAL #1   Title Pt will improve 5x STS to <10 seconds for  improved functional mobility by 02/24/2017   Baseline 17.98 sec; current 10/28/16 = 14.4 seconds; 12/30/16 13 seconds     PT LONG TERM GOAL #2   Title Pt will improve TUG to <10 seconds for improved functional mobility by 02/24/2017   Baseline 27.01 sec with crutches; one crutch 20 seconds 12/30/2016   Status Revised     PT LONG TERM GOAL #3   Title Pt will score >1.0 m/s with LRAD on 10 MW test for improved community mobility  by 3/5/ 2018   Baseline 0.42 m/s with crutches; current 13 seconds (.1566m/s: significant improvement: requires additional intervention); 12/30/16 12.3 seconds (,2966m/s: improving: still  using one crutch)   Status Revised     PT LONG TERM GOAL #4   Title Pt will improve LEFS score to 50/80 by 02/24/2017 demonstrating improved functional mobility    Baseline 14/80; current 25/80 (significant change: goal achieved and goal revised); 12/30/16; 39/80 demonstrating significant improvement: new goal 50/80   Status Revised               Plan - 01/13/17 1305    Clinical Impression Statement Patient is progressing well with strength and proprioception exercises and should continue to improve with additional physical therapy intervention to achieve maximal results to improve function.    Rehab Potential Good   PT Frequency 2x / week   PT Duration 8 weeks   PT Treatment/Interventions DME Instruction;Gait training;Stair training;Therapeutic activities;Therapeutic exercise;Balance training;Neuromuscular re-education;Patient/family education;Manual techniques;Passive range of motion;Electrical Stimulation   PT Next Visit Plan focus on motor control, strengthening left hip, electrical stimulation russian stim. to left quadriceps   PT Home Exercise Plan continue with exercises as instructed and work on control of hip musculature for flexion, abduction, isometri hip extension in neutral position in standing; short arc of motion for ER side lying      Patient will benefit from skilled therapeutic intervention in order to improve the following deficits and impairments:  Abnormal gait, Decreased knowledge of use of DME, Decreased strength, Difficulty walking, Impaired flexibility, Impaired sensation, Pain, Increased muscle spasms  Visit Diagnosis: Muscle weakness (generalized)  Difficulty in walking, not elsewhere classified     Problem List Patient Active Problem List   Diagnosis Date Noted  . Iron deficiency anemia 09/17/2016  . Status post total replacement of left hip 07/11/2016  . Chronic hypotension 07/02/2016  . Collapse of lung 07/02/2016  . H/O hepatitis 07/02/2016  .  History of positive PPD 07/02/2016  . History of seizures as a child 07/02/2016  . Hypokalemia 07/02/2016  . Acquired dysplasia of left hip 04/19/2016  . Acquired dysplasia of right hip 04/19/2016  . 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 Hypothyroidism 07/17/2015  . Disorder of pelvis 07/17/2015  . OP (osteoporosis) 07/17/2015  . Lumbar radicular pain 08/04/2013  . Lumbar scoliosis 08/04/2013    Beacher MayBrooks, Latoya Maulding PT 01/14/2017, 7:52 PM  [facility] [address] [place], [place], [address] Phone: [phone]   Fax:  [phone]  Name: Christine Herring MRN: [id number] Date of Birth: 1986-09-09

## 2017-01-15 ENCOUNTER — Ambulatory Visit: Payer: Managed Care, Other (non HMO) | Admitting: Physical Therapy

## 2017-01-15 DIAGNOSIS — R262 Difficulty in walking, not elsewhere classified: Secondary | ICD-10-CM

## 2017-01-15 DIAGNOSIS — M6281 Muscle weakness (generalized): Secondary | ICD-10-CM

## 2017-01-15 NOTE — Therapy (Signed)
[facility] PHYSICAL AND SPORTS MEDICINE [address] [place], [place], [address] Phone: [phone]   Fax:  [phone]  Physical Therapy Treatment  Patient Details  Name: Christine Herring MRN: [id number] Date of Birth: [birth year] Referring Provider: Dr. Melvyn NethLewis  Encounter Date: 01/15/2017      PT End of Session - 01/15/17 1600    Visit Number 38   Number of Visits 57   Date for PT Re-Evaluation 02/24/17   PT Start Time 1505   PT Stop Time 1540   PT Time Calculation (min) 35 min   Activity Tolerance Patient tolerated treatment well;Patient limited by fatigue   Behavior During Therapy [facility] for tasks assessed/performed      Past Medical History:  Diagnosis Date  . Hip dysplasia   . Osteoporosis   . Thyroid disease     Past Surgical History:  Procedure Laterality Date  . TOTAL HIP ARTHROPLASTY Right   . TUBAL LIGATION      There were no vitals filed for this visit.      Subjective Assessment - 01/15/17 1506    Subjective Patient reports she felt sore and tired following last session and feels overall better/stronger.    Limitations Sitting;Standing;Walking   Patient Stated Goals to get back to normal in order to be able to perform daily tasks difficulty   Currently in Pain? No/denies      Objective: Gait; one axillary crutch FWB leftLE  Treatment: Therapeutic exercises: patient performed exercises with guidance/assistance, verbal,tactile cues and demonstration of therapist: Sitting: Knee extension 3# weights on ankles 2 x 15 Knee flexion blue resistive band 2 x 25 Hip abduction with manual resistance x 15 reps Hip adduction with ball and glute sets x 15 reps Standing: Walk side stepping along balance beam x 2 min Heel raises x 15 reps, 3 sets Running man with one leg on balance beam and support of UE on counter x 15 reps,  2 sets Walk on toes x 1 min. Forward only Forward and backwards walking along counter for  support/balance x 2 min.  (not performed today:Stair master x 5 min. Level 1-2 with fatigue in LE's and endurance (note: positioned right LE in IR at hip intermittently to avoid knee strain))  Patient response to treatment: Patient with fatigue limited further exercises today. Demonstrated improved motor control with all exercises with minimal rest periods between exercises. Patient demonstrated improved technique with exercises with minimal VC for correct alignment; required support of UE on counter to perform single leg standing exercises.          PT Education - 01/15/17 1527    Education provided Yes   Education Details HEP: forward and backward walking, walk on toes, single leg standing running man   Person(s) Educated Patient   Methods Explanation;Demonstration;Verbal cues   Comprehension Verbalized understanding;Returned demonstration;Verbal cues required             PT Long Term Goals - 12/30/16 1332      PT LONG TERM GOAL #1   Title Pt will improve 5x STS to <10 seconds for improved functional mobility by 02/24/2017   Baseline 17.98 sec; current 10/28/16 = 14.4 seconds; 12/30/16 13 seconds     PT LONG TERM GOAL #2   Title Pt will improve TUG to <10 seconds for improved functional mobility by 02/24/2017   Baseline 27.01 sec with crutches; one crutch 20 seconds 12/30/2016   Status Revised     PT LONG TERM GOAL #3  Title Pt will score >1.0 m/s with LRAD on 10 MW test for improved community mobility  by 3/5/ 2018   Baseline 0.42 m/s with crutches; current 13 seconds (.31m/s: significant improvement: requires additional intervention); 12/30/16 12.3 seconds (,46m/s: improving: still using one crutch)   Status Revised     PT LONG TERM GOAL #4   Title Pt will improve LEFS score to 50/80 by 02/24/2017 demonstrating improved functional mobility    Baseline 14/80; current 25/80 (significant change: goal achieved and goal revised); 12/30/16; 39/80 demonstrating significant improvement: new  goal 50/80   Status Revised               Plan - 01/15/17 1545    Clinical Impression Statement Patient is progressing with incresaed strength and balance/stabilization exercises with improved motor control with repetition and instruction. She continues with decreased strength, endurance and will benefit from continued physical therapy intervention to achieve maximal gains.    Rehab Potential Good   PT Frequency 2x / week   PT Duration 8 weeks   PT Treatment/Interventions DME Instruction;Gait training;Stair training;Therapeutic activities;Therapeutic exercise;Balance training;Neuromuscular re-education;Patient/family education;Manual techniques;Passive range of motion;Electrical Stimulation   PT Next Visit Plan focus on motor control, strengthening left hip, electrical stimulation russian stim. to left quadriceps   PT Home Exercise Plan continue with exercises as instructed and work on control of hip musculature for flexion, abduction, isometri hip extension in neutral position in standing; short arc of motion for ER side lying      Patient will benefit from skilled therapeutic intervention in order to improve the following deficits and impairments:  Abnormal gait, Decreased knowledge of use of DME, Decreased strength, Difficulty walking, Impaired flexibility, Impaired sensation, Pain, Increased muscle spasms  Visit Diagnosis: Muscle weakness (generalized)  Difficulty in walking, not elsewhere classified     Problem List Patient Active Problem List   Diagnosis Date Noted  . Iron deficiency anemia 09/17/2016  . Status post total replacement of left hip 07/11/2016  . Chronic hypotension 07/02/2016  . Collapse of lung 07/02/2016  . H/O hepatitis 07/02/2016  . History of positive PPD 07/02/2016  . History of seizures as a child 07/02/2016  . Hypokalemia 07/02/2016  . Acquired dysplasia of left hip 04/19/2016  . Acquired dysplasia of right hip 04/19/2016  . Osteoarthritis  resulting from left hip dysplasia 04/19/2016  . Hypothyroidism 07/17/2015  . Disorder of pelvis 07/17/2015  . OP (osteoporosis) 07/17/2015  . Lumbar radicular pain 08/04/2013  . Lumbar scoliosis 08/04/2013    Beacher May PT 01/16/2017, 5:41 PM  [facility] PHYSICAL AND SPORTS MEDICINE [address], [place], [address] Phone: [phone]   Fax:  [phone]  Name: Christine Herring MRN: [id number] Date of Birth: August 12, 1986

## 2017-01-20 ENCOUNTER — Ambulatory Visit: Payer: Managed Care, Other (non HMO) | Admitting: Physical Therapy

## 2017-01-20 ENCOUNTER — Encounter: Payer: Self-pay | Admitting: Physical Therapy

## 2017-01-20 DIAGNOSIS — R262 Difficulty in walking, not elsewhere classified: Secondary | ICD-10-CM

## 2017-01-20 DIAGNOSIS — M6281 Muscle weakness (generalized): Secondary | ICD-10-CM

## 2017-01-20 NOTE — Therapy (Signed)
[facility] PH[facility]ORTS [facility]ICINE [address] [place], [place], [address] Phone: [phone]   Fax:  [phone]  Physical Therapy Treatment  Patient Details  Name: Christine Herring MRN: [id number] Date of Birth: 08/25/1986 Referring Provider: Dr. Melvyn NethLewis  Encounter Date: 01/20/2017      PT End of Session - 01/20/17 1531    Visit Number 39   Number of Visits 57   Date for PT Re-Evaluation 02/24/17   PT Start Time 1500   PT Stop Time 1530   PT Time Calculation (min) 30 min   Activity Tolerance Patient tolerated treatment well;Patient limited by fatigue   Behavior During Therapy [facility] for tasks assessed/performed      Past Medical History:  Diagnosis Date  . Hip dysplasia   . Osteoporosis   . Thyroid disease     Past Surgical History:  Procedure Laterality Date  . TOTAL HIP ARTHROPLASTY Right   . TUBAL LIGATION      There were no vitals filed for this visit.      Subjective Assessment - 01/20/17 1504    Subjective Patient reports right LE bothers her more than the left now. Left leg is much improved strength and balance.    Limitations Sitting;Standing;Walking   Patient Stated Goals to get back to normal in order to be able to perform daily tasks difficulty   Currently in Pain? No/denies      Objective: Gait: no AD in use, no LOB; uses cane if walking long distances or for extended periods of time  Treatment: Therapeutic exercises: patient performed exercises with guidance/assistance, verbal,tactile cues and demonstration of therapist: Sitting: Knee extension 4# weights on ankles 2 x 15 Knee flexion blue resistive band 2 x 25 Hip abduction with manual resistance x 15 reps Hip adduction with ball and glute sets x 15 reps Standing: Walk side stepping along balance beam x 2 min Heel raises x 20 reps, 2 sets Running man with one leg on balance beam and support of UE on counter x 15 reps, 2 sets Lateral step ups onto  balance beam + balance pad Diagonal march holding (2) #4 weights x 2 min.  Patient response to treatment: Patient demonstrated improved technique with exercises with minimal VC for correct alignment.Improved motor control with repetition and cuing         PT Education - 01/20/17 1600    Education provided Yes   Education Details HEP: re assessed home exercises, balance, strengthening    Person(s) Educated Patient   Methods Explanation;Demonstration;Verbal cues   Comprehension Verbalized understanding;Returned demonstration;Verbal cues required;Need further instruction             PT Long Term Goals - 12/30/16 1332      PT LONG TERM GOAL #1   Title Pt will improve 5x STS to <10 seconds for improved functional mobility by 02/24/2017   Baseline 17.98 sec; current 10/28/16 = 14.4 seconds; 12/30/16 13 seconds     PT LONG TERM GOAL #2   Title Pt will improve TUG to <10 seconds for improved functional mobility by 02/24/2017   Baseline 27.01 sec with crutches; one crutch 20 seconds 12/30/2016   Status Revised     PT LONG TERM GOAL #3   Title Pt will score >1.0 m/s with LRAD on 10 MW test for improved community mobility  by 3/5/ 2018   Baseline 0.42 m/s with crutches; current 13 seconds (.5062m/s: significant improvement: requires additional intervention); 12/30/16 12.3 seconds (,9649m/s: improving:  still using one crutch)   Status Revised     PT LONG TERM GOAL #4   Title Pt will improve LEFS score to 50/80 by 02/24/2017 demonstrating improved functional mobility    Baseline 14/80; current 25/80 (significant change: goal achieved and goal revised); 12/30/16; 39/80 demonstrating significant improvement: new goal 50/80   Status Revised               Plan - 01/20/17 1532    Clinical Impression Statement Patient is progressing well with goals and strength, balance as demonstrated with abililty to perform exercises with minimal cuing, assistance. She continues with limitations of strength,  balance and endurance for walking.    Rehab Potential Good   PT Frequency 2x / week   PT Duration 8 weeks   PT Treatment/Interventions DME Instruction;Gait training;Stair training;Therapeutic activities;Therapeutic exercise;Balance training;Neuromuscular re-education;Patient/family education;Manual techniques;Passive range of motion;Electrical Stimulation   PT Next Visit Plan focus on motor control, strengthening left hip, electrical stimulation russian stim. to left quadriceps   PT Home Exercise Plan continue with exercises as instructed and work on control of hip musculature for flexion, abduction, isometri hip extension in neutral position in standing; short arc of motion for ER side lying      Patient will benefit from skilled therapeutic intervention in order to improve the following deficits and impairments:  Abnormal gait, Decreased knowledge of use of DME, Decreased strength, Difficulty walking, Impaired flexibility, Impaired sensation, Pain, Increased muscle spasms  Visit Diagnosis: Muscle weakness (generalized)  Difficulty in walking, not elsewhere classified     Problem List Patient Active Problem List   Diagnosis Date Noted  . Iron deficiency anemia 09/17/2016  . Status post total replacement of left hip 07/11/2016  . Chronic hypotension 07/02/2016  . Collapse of lung 07/02/2016  . H/O hepatitis 07/02/2016  . History of positive PPD 07/02/2016  . History of seizures as a child 07/02/2016  . Hypokalemia 07/02/2016  . Acquired dysplasia of left hip 04/19/2016  . Acquired dysplasia of right hip 04/19/2016  . 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 Hypothyroidism 07/17/2015  . Disorder of pelvis 07/17/2015  . OP (osteoporosis) 07/17/2015  . Lumbar radicular pain 08/04/2013  . Lumbar scoliosis 08/04/2013    Beacher May PT 01/20/2017, 10:05 PM  [facility] PHYSICAL AND SPORTS MEDICINE [address], [place], [address] Phone: [phone]   Fax:  [phone]  Name: Christine Herring MRN: [id number] Date of Birth: [birth year]

## 2017-01-22 ENCOUNTER — Encounter: Payer: Self-pay | Admitting: Psychiatry

## 2017-01-22 ENCOUNTER — Ambulatory Visit (INDEPENDENT_AMBULATORY_CARE_PROVIDER_SITE_OTHER): Payer: 59 | Admitting: Psychiatry

## 2017-01-22 ENCOUNTER — Encounter: Payer: Managed Care, Other (non HMO) | Admitting: Physical Therapy

## 2017-01-22 VITALS — BP 103/72 | HR 53 | Temp 98.2°F | Wt 164.4 lb

## 2017-01-22 DIAGNOSIS — F4323 Adjustment disorder with mixed anxiety and depressed mood: Secondary | ICD-10-CM

## 2017-01-22 MED ORDER — VENLAFAXINE HCL ER 75 MG PO CP24: 75.0000 mg | ORAL_CAPSULE | Freq: Every day | ORAL | 1 refills | Status: DC
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
## 2017-01-23 ENCOUNTER — Ambulatory Visit: Payer: Managed Care, Other (non HMO) | Admitting: Physical Therapy

## 2017-01-27 ENCOUNTER — Ambulatory Visit: Payer: Managed Care, Other (non HMO) | Admitting: Physical Therapy

## 2017-01-29 ENCOUNTER — Encounter: Payer: Self-pay | Admitting: Physical Therapy

## 2017-01-29 ENCOUNTER — Ambulatory Visit: Payer: Managed Care, Other (non HMO) | Attending: Orthopedic Surgery | Admitting: Physical Therapy

## 2017-01-29 DIAGNOSIS — R262 Difficulty in walking, not elsewhere classified: Secondary | ICD-10-CM | POA: Diagnosis present

## 2017-01-29 DIAGNOSIS — M6281 Muscle weakness (generalized): Secondary | ICD-10-CM | POA: Diagnosis not present

## 2017-01-29 NOTE — Therapy (Signed)
[facility] [address] [place], [address] Phone: [phone]   Fax:  [phone]  Physical Therapy Treatment  Patient Details  Name: Christine Herring MRN: [id number] Date of Birth: Nov 24, 1986 Referring Provider: Dr. Melvyn Neth  Encounter Date: 01/29/2017      PT End of Session - 01/29/17 1507    Visit Number 40   Number of Visits 57   Date for PT Re-Evaluation 02/24/17   PT Start Time 1505   PT Stop Time 1536   PT Time Calculation (min) 31 min   Activity Tolerance Patient tolerated treatment well;Patient limited by fatigue   Behavior During Therapy [facility] for tasks assessed/performed      Past Medical History:  Diagnosis Date  . Hip dysplasia   . Osteoporosis   . Seizures (HCC)    when she was a child reason not know  . Thyroid disease     Past Surgical History:  Procedure Laterality Date  . TOTAL HIP ARTHROPLASTY Right   . TUBAL LIGATION      There were no vitals filed for this visit.      Subjective Assessment - 01/29/17 1506    Subjective Patient reports she is getting over sickness and is feeling tired today. She is having MRI of right hip this week.    Limitations Sitting;Standing;Walking   Patient Stated Goals to get back to normal in order to be able to perform daily tasks difficulty   Currently in Pain? No/denies      Objective: Gait: ambulating independently with right hip IR  Treatment: Therapeutic exercises: patient performed exercises with guidance/assistance, verbal,tactile cues and demonstration of therapist: Sitting: Hip abduction with manual resistance x 15 reps Hip adduction with ball and glute sets x 15 reps Standing: Walk side stepping along balance beam x 2 min Heel raises x 20 reps, 2 sets Running man with one leg on balance beam and support of UE on counter x 15reps, 2sets Lateral step ups onto balance beam with hip flexion, toe tap, hip flexion sequence with  patient using UE's for support X 10 reps each Diagonal march holding (2) #3 weights x 2 min.  Patient response to treatment: Patient demonstrated improved technique with hip flexion with assist of UE's for balance. Patient required VC to perform exercises through short arc with improved motor control with repetition.           PT Education - 01/29/17 1525    Education provided Yes   Education Details HEP: running man and calf raises added, continue with ROM, strengthening   Person(s) Educated Patient   Methods Explanation;Demonstration;Verbal cues   Comprehension Verbalized understanding;Returned demonstration;Verbal cues required             PT Long Term Goals - 12/30/16 1332      PT LONG TERM GOAL #1   Title Pt will improve 5x STS to <10 seconds for improved functional mobility by 02/24/2017   Baseline 17.98 sec; current 10/28/16 = 14.4 seconds; 12/30/16 13 seconds     PT LONG TERM GOAL #2   Title Pt will improve TUG to <10 seconds for improved functional mobility by 02/24/2017   Baseline 27.01 sec with crutches; one crutch 20 seconds 12/30/2016   Status Revised     PT LONG TERM GOAL #3   Title Pt will score >1.0 m/s with LRAD on 10 MW test for improved community mobility  by 3/5/ 2018   Baseline 0.42 m/s with crutches;  current 13 seconds (.4668m/s: significant improvement: requires additional intervention); 12/30/16 12.3 seconds (,3039m/s: improving: still using one crutch)   Status Revised     PT LONG TERM GOAL #4   Title Pt will improve LEFS score to 50/80 by 02/24/2017 demonstrating improved functional mobility    Baseline 14/80; current 25/80 (significant change: goal achieved and goal revised); 12/30/16; 39/80 demonstrating significant improvement: new goal 50/80   Status Revised               Plan - 01/29/17 1538    Clinical Impression Statement Patient continues to progress towards goals with improving strength and balance. She conitnues with decreased left hip flexor  strength and difficulty with balance and endurance and requires cuing to perform exercises with correct technique and alignment. She will benefit from continued physical therpay intervention to achieve maximal function.     Rehab Potential Good   PT Frequency 2x / week   PT Duration 8 weeks   PT Treatment/Interventions DME Instruction;Gait training;Stair training;Therapeutic activities;Therapeutic exercise;Balance training;Neuromuscular re-education;Patient/family education;Manual techniques;Passive range of motion;Electrical Stimulation   PT Next Visit Plan focus on motor control, strengthening left hip, electrical stimulation russian stim. to left quadriceps   PT Home Exercise Plan continue with exercises as instructed and work on control of hip musculature for flexion, abduction, isometri hip extension in neutral position in standing; short arc of motion for ER side lying      Patient will benefit from skilled therapeutic intervention in order to improve the following deficits and impairments:  Abnormal gait, Decreased knowledge of use of DME, Decreased strength, Difficulty walking, Impaired flexibility, Impaired sensation, Pain, Increased muscle spasms  Visit Diagnosis: Muscle weakness (generalized)  Difficulty in walking, not elsewhere classified     Problem List Patient Active Problem List   Diagnosis Date Noted  . Iron deficiency anemia 09/17/2016  . Status post total replacement of left hip 07/11/2016  . Chronic hypotension 07/02/2016  . Collapse of lung 07/02/2016  . H/O hepatitis 07/02/2016  . History of positive PPD 07/02/2016  . History of seizures as a child 07/02/2016  . Hypokalemia 07/02/2016  . Acquired dysplasia of left hip 04/19/2016  . Acquired dysplasia of right hip 04/19/2016  . 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 Hypothyroidism 07/17/2015  . Disorder of pelvis 07/17/2015  . OP (osteoporosis) 07/17/2015  . Lumbar radicular pain  08/04/2013  . Lumbar scoliosis 08/04/2013    Beacher MayBrooks, Melvin Marmo PT 01/30/2017, 8:21 PM  [facility] [address] [place], [place], [address] Phone: [phone]   Fax:  [phone]  Name: Christine Herring MRN: [id number] Date of Birth: December 18, 1986

## 2017-02-03 ENCOUNTER — Ambulatory Visit: Payer: Managed Care, Other (non HMO)

## 2017-02-03 DIAGNOSIS — M6281 Muscle weakness (generalized): Secondary | ICD-10-CM | POA: Diagnosis not present

## 2017-02-03 DIAGNOSIS — R262 Difficulty in walking, not elsewhere classified: Secondary | ICD-10-CM

## 2017-02-03 NOTE — Therapy (Signed)
[facility] [address], [place], [address] Phone: [phone]   Fax:  [phone]  Physical Therapy Treatment  Patient Details  Name: Christine Herring MRN: [id number] Date of Birth: 11/01/1986 Referring Provider: Dr. Melvyn Neth  Encounter Date: 02/03/2017      PT End of Session - 02/03/17 1504    Visit Number 41   Number of Visits 57   Date for PT Re-Evaluation 02/24/17   PT Start Time 1505   PT Stop Time 1550   PT Time Calculation (min) 45 min   Activity Tolerance Patient tolerated treatment well;Patient limited by fatigue   Behavior During Therapy [facility] for tasks assessed/performed      Past Medical History:  Diagnosis Date  . Hip dysplasia   . Osteoporosis   . Seizures (HCC)    when she was a child reason not know  . Thyroid disease     Past Surgical History:  Procedure Laterality Date  . TOTAL HIP ARTHROPLASTY Right   . TUBAL LIGATION      There were no vitals filed for this visit.      Subjective Assessment - 02/03/17 1503    Subjective Patient reports she is doing well on this date. She is waiting to have her R hip MRI. Insurance is waiting to approve R hip CT until after MRI if it is still needed. Pt denies any L hip pain at this time. Reports intermittent R knee pain but not currently.    Pertinent History hx of B hip displasia, B hip labral reconstruction, osteroporosis, leg length discrepancy?, and now L THR   Limitations Sitting;Standing;Walking   Patient Stated Goals to get back to normal in order to be able to perform daily tasks difficulty   Currently in Pain? No/denies   Multiple Pain Sites No        Treatment:  Therapeutic exercises: patient performed exercises with guidance/assistance, verbal, tactile cues and demonstration of therapist:  Omega knee extension 5# 2 x 15 RLE, 2 x 13 LLE, pt fatigues at repetition 13; Omega knee flexion 20# 2 x 15; Hip abduction with 3# ankle  weight on LLE, no weight on RLE, assist to depress pelvis to avoid hiking 2 x 10, stance leg on Airex pad; Running man with one leg on Airex pad and support of UE on railing 2 x 10; Walk side stepping along balance beam x 2 min Pallof press 5# 2 x 10 bilateral varying foot position to increase difficulty; Sidelying hip bridges x 10 bilateral;  Patient response to treatment: Patient demonstrated improved technique with exercises with minimal VC for correct alignment.Improved motor control with repetition and cuing                          PT Education - 02/03/17 1504    Education provided Yes   Education Details HEP reinforced   Person(s) Educated Patient   Methods Explanation   Comprehension Verbalized understanding             PT Long Term Goals - 12/30/16 1332      PT LONG TERM GOAL #1   Title Pt will improve 5x STS to <10 seconds for improved functional mobility by 02/24/2017   Baseline 17.98 sec; current 10/28/16 = 14.4 seconds; 12/30/16 13 seconds     PT LONG TERM GOAL #2   Title Pt will improve TUG to <10 seconds for improved functional mobility by  02/24/2017   Baseline 27.01 sec with crutches; one crutch 20 seconds 12/30/2016   Status Revised     PT LONG TERM GOAL #3   Title Pt will score >1.0 m/s with LRAD on 10 MW test for improved community mobility  by 3/5/ 2018   Baseline 0.42 m/s with crutches; current 13 seconds (.5369m/s: significant improvement: requires additional intervention); 12/30/16 12.3 seconds (,3165m/s: improving: still using one crutch)   Status Revised     PT LONG TERM GOAL #4   Title Pt will improve LEFS score to 50/80 by 02/24/2017 demonstrating improved functional mobility    Baseline 14/80; current 25/80 (significant change: goal achieved and goal revised); 12/30/16; 39/80 demonstrating significant improvement: new goal 50/80   Status Revised               Plan - 02/03/17 1504    Clinical Impression Statement Patient continues to  make progress toward goals and has made significant improvement since the last time she was seen by this therapist. She has severe R hip abduction weakness noted with standing exercises. Pt encouraged to continue HEP and follow-up as scheduled.    Rehab Potential Good   Clinical Impairments Affecting Rehab Potential (+) age, motivated, acute condition of recent left hip surgery  (-) hx of hip displasia, multiple surgeries both hips   PT Frequency 2x / week   PT Duration 8 weeks   PT Treatment/Interventions DME Instruction;Gait training;Stair training;Therapeutic activities;Therapeutic exercise;Balance training;Neuromuscular re-education;Patient/family education;Manual techniques;Passive range of motion;Electrical Stimulation   PT Next Visit Plan focus on motor control, strengthening left hip, electrical stimulation russian stim. to left quadriceps   PT Home Exercise Plan continue with exercises as instructed and work on control of hip musculature for flexion, abduction, isometri hip extension in neutral position in standing; short arc of motion for ER side lying      Patient will benefit from skilled therapeutic intervention in order to improve the following deficits and impairments:  Abnormal gait, Decreased knowledge of use of DME, Decreased strength, Difficulty walking, Impaired flexibility, Impaired sensation, Pain, Increased muscle spasms  Visit Diagnosis: Muscle weakness (generalized)  Difficulty in walking, not elsewhere classified     Problem List Patient Active Problem List   Diagnosis Date Noted  . Iron deficiency anemia 09/17/2016  . Status post total replacement of left hip 07/11/2016  . Chronic hypotension 07/02/2016  . Collapse of lung 07/02/2016  . H/O hepatitis 07/02/2016  . History of positive PPD 07/02/2016  . History of seizures as a child 07/02/2016  . Hypokalemia 07/02/2016  . Acquired dysplasia of left hip 04/19/2016  . Acquired dysplasia of right hip 04/19/2016   . 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 Hypothyroidism 07/17/2015  . Disorder of pelvis 07/17/2015  . OP (osteoporosis) 07/17/2015  . Lumbar radicular pain 08/04/2013  . Lumbar scoliosis 08/04/2013   Lynnea MaizesJason D Ghadeer Kastelic PT, DPT   Gretchen Weinfeld 02/03/2017, 4:10 PM  [facility] [address] [place], [place], [address] Phone: [phone]   Fax:  [phone]  Name: Maxcine HamSilvana Rahrig MRN: [id number] Date of Birth: [birth year]

## 2017-02-05 ENCOUNTER — Ambulatory Visit: Payer: Managed Care, Other (non HMO) | Admitting: Physical Therapy

## 2017-02-11 ENCOUNTER — Ambulatory Visit (INDEPENDENT_AMBULATORY_CARE_PROVIDER_SITE_OTHER): Payer: Managed Care, Other (non HMO) | Admitting: Obstetrics and Gynecology

## 2017-02-11 ENCOUNTER — Encounter: Payer: Self-pay | Admitting: Obstetrics and Gynecology

## 2017-02-11 VITALS — BP 139/74 | HR 58 | Ht 65.0 in | Wt 166.1 lb

## 2017-02-11 DIAGNOSIS — N644 Mastodynia: Secondary | ICD-10-CM

## 2017-02-11 DIAGNOSIS — N6452 Nipple discharge: Secondary | ICD-10-CM

## 2017-02-11 NOTE — Patient Instructions (Addendum)
1. Ultrasound and diagnostic mammogram is ordered 2. Prolactin level and TSH is ordered 3. Return for physical September 2018 4. General surgery referral 5. Trial of hydrocortisone cream topically to the left breast rash 3-4 times a day

## 2017-02-12 ENCOUNTER — Ambulatory Visit: Payer: Managed Care, Other (non HMO) | Admitting: Physical Therapy

## 2017-02-12 LAB — TSH: TSH: 3.46 u[IU]/mL (ref 0.450–4.500)

## 2017-02-12 NOTE — Progress Notes (Signed)
GYN ENCOUNTER NOTE  Subjective:       Christine Herring is a 31 y.o. [phone] female is here for gynecologic evaluation of the following issues:  1. Left breast pain 2. Left nipple discharge  31 year old married female para 3013, using BTL for contraception, presents for evaluation of left breast pain which started several months ago, along with history of a pasty nipple discharge and associated peri-areola itching.the symptoms have been ongoing for several months but she ignored the symptoms until recently. Breasts pain is dull achy and constant; wearing her bra, clothes, and showering exacerbate symptoms. The nipple discharge initially started as a clear discharge, and then progressed t a reddish-brown sticky pasty consistency. 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 She has not had episodes of galactorrhea in recent years. The patient does have hypothyroidism and is on thyroid supplementation. There is a family history of breast cancer in maternal aunt.   Gynecologic History Patient's last menstrual period was 02/09/2017 (exact date). Contraception: tubal ligation Last Pap:09/17/2016 Last mammogram: na Menarche: Age 34 Intervals: Irregular (shortest 2 weeks; longest 4-5 days) Duration: 6-7 days Dysmenorrhea history: +7/10; Advil 3 a day and heating pad used for symptoms; pain is notable for bilateral and central cramping along with low back pain with radiation into the legs Dyspareunia history: Positive with deep thrusting History of abnormal Pap smears: 2013; colposcopy with biopsies was performed at [facility] without treatment  Obstetric History OB History  Gravida Para Term Preterm AB Living  4 3 3   1 3   SAB TAB Ectopic Multiple Live Births  1       3    # Outcome Date GA Lbr Len/2nd Weight Sex Delivery Anes PTL Lv  4 Term 2013   9 lb (4.082 kg) M Vag-Spont   LIV  3 Term 2010   8 lb (3.629 kg) F Vag-Spont   LIV  2 Term 2007   7 lb 12.8 oz (3.538 kg) F Vag-Spont   LIV   1 SAB 1990              Past Medical History:  Diagnosis Date  . Anxiety   . Hip dysplasia   . Osteoporosis   . Seizures (HCC)    when she was a child reason not know  . Thyroid disease     Past Surgical History:  Procedure Laterality Date  . TOTAL HIP ARTHROPLASTY Right   . TUBAL LIGATION      Current Outpatient Prescriptions on File Prior to Visit  Medication Sig Dispense Refill  . Calcium Carb-Cholecalciferol 500-200 MG-UNIT TABS Take 2 tablets by mouth daily. 60 tablet 11  . ferrous sulfate 325 (65 FE) MG tablet Take 1 tablet (325 mg total) by mouth daily with breakfast. 30 tablet 3  . levothyroxine (SYNTHROID, LEVOTHROID) 100 MCG tablet Take 1 tablet (100 mcg total) by mouth daily. 30 tablet 11   No current facility-administered medications on file prior to visit.     No Known Allergies  Social History   Social History  . Marital status: Single    Spouse name: N/A  . Number of children: N/A  . Years of education: N/A   Occupational History  . Not on file.   Social History Main Topics  . Smoking status: Former Smoker    Packs/day: 0.50    Years: 10.00    Quit date: 07/16/2005  . Smokeless tobacco: Never Used  . Alcohol use Yes     Comment: social  .  Drug use: No  . Sexual activity: Yes    Birth control/ protection: Surgical   Other Topics Concern  . Not on file   Social History Narrative  . No narrative on file    Family History  Problem Relation Age of Onset  . Thyroid disease Mother   . Breast cancer Maternal Aunt   . Colon cancer Maternal Aunt     grt   . Ovarian cancer Paternal Grandmother   . Diabetes Neg Hx   . Heart disease Neg Hx     The following portions of the patient's history were reviewed and updated as appropriate: allergies, current medications, past family history, past medical history, past social history, past surgical history and problem list.  Review of Systems Review of Systems  Constitutional: Negative for chills,  diaphoresis, fever and weight loss.  HENT: Negative.   Respiratory: Negative.   Cardiovascular: Negative.   Gastrointestinal: Negative.   Genitourinary:       See history of present illness  Musculoskeletal: Negative.   Skin: Positive for itching and rash.       Left breast  Neurological: Negative.   Endo/Heme/Allergies: Negative.   Psychiatric/Behavioral: Negative.      Objective:   BP 139/74   Pulse (!) 58   Ht 5\' 5"  (1.651 m)   Wt 166 lb 1.6 oz (75.3 kg)   LMP 02/09/2017 (Exact Date)   BMI 27.64 kg/m  CONSTITUTIONAL: Well-developed, well-nourished female in no acute distress.  HENT:  Normocephalic, atraumatic. No exophthalmos. No nystagmus NECK: Normal range of motion, supple, no masses.  Normal thyroid.  SKIN: Skin is warm and dry. No rash noted. Not diaphoretic. No erythema. No pallor. NEUROLGIC: Alert and oriented to person, place, and time. PSYCHIATRIC: Normal mood and affect. Normal behavior. Normal judgment and thought content. CARDIOVASCULAR:Not Examined RESPIRATORY: Not Examined BREASTS: diffuse bilateral fibrocystic changes without palpable mass; tenderness in upper quadrants of left breast; diffuse patchy erythema left breast ABDOMEN: not examined PELVIC:not examined MUSCULOSKELETAL: Normal range of motion. No tenderness.  No cyanosis, clubbing, or edema.     Assessment:   1. Pain of left breast - Prolactin - MM Digital Diagnostic Bilat; Future - US BREAST LTD UNI LEFT INC AXILLA; Future - US BREAST LTD UNI RIGHT INC AXILLA; Future - TSH - Ambulatory referral to General Surgery  2. Discharge from left nipple - Prolactin - MM Digital Diagnostic Bilat; Future - US BREAST LTD UNI LEFT INC AXILLA; Future - US BREAST LTD UNI RIGHT INC AXILLA; Future - TSH - Ambulatory referral to General Surgery   History and physical suspicious for an intraductal papilloma. Skin rash, left breast, may be a contact dermatitis/irritation.  Plan:   1. Prolactin  level 2. TSH 3. Ultrasound and diagnostic mammogram left breast 4. Hydrocortisone cream topically to left breast 3-4 times a day 5.. General surgery referral 6. Return for GYN physical September 2018  Herold HarmsMartin A Suhani Stillion, MD  Note: This dictation was prepared with Dragon dictation along with smaller phrase technology. Any transcriptional errors that result from this process are unintentional.

## 2017-02-17 ENCOUNTER — Ambulatory Visit: Payer: Managed Care, Other (non HMO) | Admitting: Physical Therapy

## 2017-02-17 ENCOUNTER — Encounter: Payer: Self-pay | Admitting: Physical Therapy

## 2017-02-17 DIAGNOSIS — M6281 Muscle weakness (generalized): Secondary | ICD-10-CM

## 2017-02-17 DIAGNOSIS — R262 Difficulty in walking, not elsewhere classified: Secondary | ICD-10-CM

## 2017-02-18 NOTE — Therapy (Signed)
[facility] [address], [place], [address] Phone: [phone]   Fax:  [phone]  Physical Therapy Treatment  Patient Details  Name: Christine Herring MRN: [id number] Date of Birth: 1986-04-19 Referring Provider: Dr. Melvyn Neth  Encounter Date: 02/17/2017      PT End of Session - 02/17/17 1548    Visit Number 42   Number of Visits 57   Date for PT Re-Evaluation 02/24/17   PT Start Time 1500   PT Stop Time 1547   PT Time Calculation (min) 47 min   Activity Tolerance Patient tolerated treatment well;Patient limited by fatigue   Behavior During Therapy [facility] for tasks assessed/performed      Past Medical History:  Diagnosis Date  . Anxiety   . Hip dysplasia   . Osteoporosis   . Seizures (HCC)    when she was a child reason not know  . Thyroid disease     Past Surgical History:  Procedure Laterality Date  . TOTAL HIP ARTHROPLASTY Right   . TUBAL LIGATION      There were no vitals filed for this visit.      Subjective Assessment - 02/17/17 1508    Subjective Patient reports she has been sitting a lot lately in class and not able to exercise much. She has had left hip pain over this weekend and is having aching/discomfort in her left hip today. She has had MRI of right hip and is now waiting to hear for CT scan to determine if surgery is indicated.    Limitations Sitting;Standing;Walking   Patient Stated Goals to get back to normal in order to be able to perform daily tasks difficulty   Currently in Pain? Yes   Pain Score 3    Pain Location Hip   Pain Orientation Left   Pain Descriptors / Indicators Aching   Pain Type Acute pain   Pain Onset More than a month ago   Pain Frequency Intermittent      Objective: Gait: decreased trunk rotation, IR right LE from hip Strength:  Right LE hip abduction 3/5, extension 3+/5, Left LE hip abduction 4-/5, extension 4/5   Treatment:  Therapeutic  exercises: patient performed exercises with guidance/assistance, verbal,tactile cues and demonstration of therapist:  Omega knee extension 5# 2 x 15 bilateral Omega knee flexion 20# 2 x 15 both LE's Rotary hip machine: standing hip abduction with right LE 25#, left 25# x 10 reps; hip extension right 40# and left LE 55# x 10 reps each Walk side stepping along balance beam x 2 min with balance stones on top of beam Standing hip flexion, tap balance beam, hip flexion and return to floor x 10 each LE ( balance stone on top of balance beam) Pallof press at [employer] cable machine:  5# 2 x 10 bilateral varying foot position to increase difficulty; wide stance vs feet together Planks on wall 2 x 10 seconds, added hip extension x 5 reps each LE, side planks on wall 10 seconds each side and attempted hip abduction with increased difficulty noted with right side against wall, unable to perform left hip abduction Compared planks off counter: patient with increased challenge and substitution therefore returned to wall    Patient response to treatment: Patient demonstrated improved motor control and technique with demonstration and VC for planks on wall; increased substitution with counter planks. Improved pain in left hip to 0/10 at end of session following exercises. patient reported fatigue with  exercises and feeling stronger at end of session.         PT Education - 02/17/17 1510    Education provided Yes   Education Details HEP: continue for balance and strengthening, planks on wall, hip extension, abduction   Person(s) Educated Patient   Methods Explanation;Demonstration;Verbal cues   Comprehension Verbalized understanding;Returned demonstration;Verbal cues required             PT Long Term Goals - 12/30/16 1332      PT LONG TERM GOAL #1   Title Pt will improve 5x STS to <10 seconds for improved functional mobility by 02/24/2017   Baseline 17.98 sec; current 10/28/16 = 14.4 seconds; 12/30/16  13 seconds     PT LONG TERM GOAL #2   Title Pt will improve TUG to <10 seconds for improved functional mobility by 02/24/2017   Baseline 27.01 sec with crutches; one crutch 20 seconds 12/30/2016   Status Revised     PT LONG TERM GOAL #3   Title Pt will score >1.0 m/s with LRAD on 10 MW test for improved community mobility  by 3/5/ 2018   Baseline 0.42 m/s with crutches; current 13 seconds (.6474m/s: significant improvement: requires additional intervention); 12/30/16 12.3 seconds (,7036m/s: improving: still using one crutch)   Status Revised     PT LONG TERM GOAL #4   Title Pt will improve LEFS score to 50/80 by 02/24/2017 demonstrating improved functional mobility    Baseline 14/80; current 25/80 (significant change: goal achieved and goal revised); 12/30/16; 39/80 demonstrating significant improvement: new goal 50/80   Status Revised               Plan - 02/17/17 1600    Clinical Impression Statement Patient continues to progress well towards improvement with left hip strength and is now being evaluated for possible right hip surgery due to increased weakness and pain. She continues to benefit from physical therapy intervention to address weakness in order to improve function with ambulation and daily tasks involving LE's .    Rehab Potential Good   PT Frequency 2x / week   PT Duration 8 weeks   PT Next Visit Plan therapeutic exercises for strength, balance, endurance   PT Home Exercise Plan standing planks on wall, progress to counter as tolerated, continue with supine, side lying strengthening exercises      Patient will benefit from skilled therapeutic intervention in order to improve the following deficits and impairments:  Abnormal gait, Decreased strength, Difficulty walking, Impaired flexibility, Impaired sensation, Pain, Increased muscle spasms, Impaired perceived functional ability  Visit Diagnosis: Muscle weakness (generalized)  Difficulty in walking, not elsewhere  classified     Problem List Patient Active Problem List   Diagnosis Date Noted  . Discharge from left nipple 02/12/2017  . Pain of left breast 02/12/2017  . Iron deficiency anemia 09/17/2016  . Status post total replacement of left hip 07/11/2016  . Chronic hypotension 07/02/2016  . Collapse of lung 07/02/2016  . H/O hepatitis 07/02/2016  . History of positive PPD 07/02/2016  . History of seizures as a child 07/02/2016  . Hypokalemia 07/02/2016  . Acquired dysplasia of left hip 04/19/2016  . Acquired dysplasia of right hip 04/19/2016  . 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 Hypothyroidism 07/17/2015  . Disorder of pelvis 07/17/2015  . OP (osteoporosis) 07/17/2015  . Lumbar radicular pain 08/04/2013  . Lumbar scoliosis 08/04/2013    Beacher MayBrooks, Maylon Sailors PT 02/18/2017, 9:22 PM  [facility] [address] [place], [address] Phone: [phone]   Fax:  [phone]  Name: Lei Dower MRN: [id number] Date of Birth: 12-14-1986

## 2017-02-19 ENCOUNTER — Ambulatory Visit: Payer: Managed Care, Other (non HMO) | Admitting: Physical Therapy

## 2017-02-19 ENCOUNTER — Ambulatory Visit: Payer: 59 | Admitting: Licensed Clinical Social Worker

## 2017-02-19 ENCOUNTER — Ambulatory Visit: Payer: 59 | Admitting: Psychiatry

## 2017-02-19 ENCOUNTER — Encounter: Payer: Self-pay | Admitting: Physical Therapy

## 2017-02-19 DIAGNOSIS — R262 Difficulty in walking, not elsewhere classified: Secondary | ICD-10-CM

## 2017-02-19 DIAGNOSIS — M6281 Muscle weakness (generalized): Secondary | ICD-10-CM | POA: Diagnosis not present

## 2017-02-19 NOTE — Therapy (Signed)
[facility] [address] [place], [address] Phone: [phone]   Fax:  [phone]  Physical Therapy Treatment  Patient Details  Name: Christine Herring MRN: [id number] Date of Birth: [birth year] Referring Provider: Dr. Melvyn Neth  Encounter Date: 02/19/2017      PT End of Session - 02/19/17 1600    Visit Number 43   Number of Visits 57   Date for PT Re-Evaluation 02/24/17   PT Start Time 1508   PT Stop Time 1548   PT Time Calculation (min) 40 min   Activity Tolerance Patient tolerated treatment well;Patient limited by fatigue   Behavior During Therapy [facility] for tasks assessed/performed      Past Medical History:  Diagnosis Date  . Anxiety   . Hip dysplasia   . Osteoporosis   . Seizures (HCC)    when she was a child reason not know  . Thyroid disease     Past Surgical History:  Procedure Laterality Date  . TOTAL HIP ARTHROPLASTY Right   . TUBAL LIGATION      There were no vitals filed for this visit.      Subjective Assessment - 02/19/17 1509    Subjective Patient reports she is having less hip pain today. She continues with popping and grinding in right hip, lateral aspect with raising LE out to side (abduction).    Limitations Sitting;Standing;Walking   Patient Stated Goals to get back to normal in order to be able to perform daily tasks difficulty   Currently in Pain? No/denies         Objective: Gait: decreased trunk rotation, IR right LE from hip Strength:  Right LE hip abduction 3-/5, extension 3/5, Left LE hip abduction 4-/5, extension 4/5 Palpation: right hip with popping and soft tissue rubbing with right hip abduction in side lying and standing  Treatment: Therapeutic exercises: patient performed exercises with guidance/assistance, verbal,tactile cues and demonstration of therapist: Standing at wall: hip abduction right: unable to perform; modified exercise to isolate right hip  abduction with tapping foot out to side and not raising LE up off floor: 5 reps with improved control without hiking hip Omega knee extension 10# 2 x 15 bilateral LE's Omega kneeflexion30#2 x 15 both LE's Rotary hip machine: standing hip abduction with right LE 25#, left 25# x 12 reps; hip extension right 55# and left LE 55# x 15 reps each Walk side stepping along balance beam x 10 reps with balance stones on top of beam Standing hip flexion, tap balance beam, hip flexion and return to floor x 5 each LE ( balance stone on top of balance beam) Pallof press at [employer] cable machine:  7# 2 x 10 bilateral with wide stance Balance system for limits of stability for strength, proprioception, balance; 2x each low and medium skill levels accuracy improved from 65% to 84% using both UE;s for support and with only one UE support (left hand on bar 79%, right hand 89%: improved stability with right hand support: weaker LE)   Patient response to treatment: Patient response to treatment: patient demonstrated improved technique with exercises with minimal VC for correct alignment on balance system. Patient with improved stability with balance exercises with repetition. Improved motor control with right hip abduction with tapping LE out to side instead of raising it off floor.          PT Education - 02/19/17 1513    Education provided Yes   Education Details HEP:  continues with planks, hip abduction/extension    Person(s) Educated Patient   Methods Explanation;Verbal cues;Demonstration   Comprehension Verbalized understanding;Returned demonstration;Verbal cues required             PT Long Term Goals - 12/30/16 1332      PT LONG TERM GOAL #1   Title Pt will improve 5x STS to <10 seconds for improved functional mobility by 02/24/2017   Baseline 17.98 sec; current 10/28/16 = 14.4 seconds; 12/30/16 13 seconds     PT LONG TERM GOAL #2   Title Pt will improve TUG to <10 seconds for improved functional  mobility by 02/24/2017   Baseline 27.01 sec with crutches; one crutch 20 seconds 12/30/2016   Status Revised     PT LONG TERM GOAL #3   Title Pt will score >1.0 m/s with LRAD on 10 MW test for improved community mobility  by 3/5/ 2018   Baseline 0.42 m/s with crutches; current 13 seconds (.639m/s: significant improvement: requires additional intervention); 12/30/16 12.3 seconds (,7444m/s: improving: still using one crutch)   Status Revised     PT LONG TERM GOAL #4   Title Pt will improve LEFS score to 50/80 by 02/24/2017 demonstrating improved functional mobility    Baseline 14/80; current 25/80 (significant change: goal achieved and goal revised); 12/30/16; 39/80 demonstrating significant improvement: new goal 50/80   Status Revised               Plan - 02/19/17 1514    Clinical Impression Statement Patient is progressing well with strength left hip and continues with weakness and difficulty with walking due to pain, weakness in right hip>left hip. She requires assistance, instruction and cuing to perform all exercises with advanced strengthening and should continue to progress with additional physical therapy intervention to address limitaitons.    Rehab Potential Good   PT Frequency 2x / week   PT Duration 8 weeks   PT Treatment/Interventions Gait training;Stair training;Therapeutic activities;Therapeutic exercise;Balance training;Neuromuscular re-education;Patient/family education;Manual techniques;Passive range of motion;Electrical Stimulation   PT Next Visit Plan therapeutic exercises for strength, balance, endurance   PT Home Exercise Plan standing planks on wall, progress to counter as tolerated, continue with supine, side lying strengthening exercises      Patient will benefit from skilled therapeutic intervention in order to improve the following deficits and impairments:  Abnormal gait, Decreased strength, Difficulty walking, Impaired flexibility, Impaired sensation, Pain, Increased  muscle spasms, Impaired perceived functional ability  Visit Diagnosis: Muscle weakness (generalized)  Difficulty in walking, not elsewhere classified     Problem List Patient Active Problem List   Diagnosis Date Noted  . Discharge from left nipple 02/12/2017  . Pain of left breast 02/12/2017  . Iron deficiency anemia 09/17/2016  . Status post total replacement of left hip 07/11/2016  . Chronic hypotension 07/02/2016  . Collapse of lung 07/02/2016  . H/O hepatitis 07/02/2016  . History of positive PPD 07/02/2016  . History of seizures as a child 07/02/2016  . Hypokalemia 07/02/2016  . Acquired dysplasia of left hip 04/19/2016  . Acquired dysplasia of right hip 04/19/2016  . 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 Hypothyroidism 07/17/2015  . Disorder of pelvis 07/17/2015  . OP (osteoporosis) 07/17/2015  . Lumbar radicular pain 08/04/2013  . Lumbar scoliosis 08/04/2013    Beacher MayBrooks, Esvin Hnat PT 02/19/2017, 6:02 PM  [place] [facility] [address] [place], [place], [address] Phone: [phone]   Fax:  [phone]  Name: Christine Herring MRN: [id number] Date of Birth: September 25, 1986

## 2017-02-24 ENCOUNTER — Encounter: Payer: Self-pay | Admitting: Physical Therapy

## 2017-02-24 ENCOUNTER — Ambulatory Visit: Payer: Managed Care, Other (non HMO) | Attending: Orthopedic Surgery | Admitting: Physical Therapy

## 2017-02-24 DIAGNOSIS — M6281 Muscle weakness (generalized): Secondary | ICD-10-CM

## 2017-02-24 DIAGNOSIS — M25551 Pain in right hip: Secondary | ICD-10-CM | POA: Diagnosis present

## 2017-02-24 DIAGNOSIS — R262 Difficulty in walking, not elsewhere classified: Secondary | ICD-10-CM | POA: Diagnosis present

## 2017-02-24 NOTE — Therapy (Signed)
[place] [facility] [address] [place], [address] Phone: [phone]   Fax:  [phone]  Physical Therapy Treatment  Patient Details  Name: Christine Herring MRN: [id number] Date of Birth: 1986-09-12 Referring Provider: Dr. Melvyn Neth  Encounter Date: 02/24/2017      PT End of Session - 02/24/17 1702    Visit Number 44   Number of Visits 57   Date for PT Re-Evaluation 02/24/17   PT Start Time 1505   PT Stop Time 1600   PT Time Calculation (min) 55 min   Activity Tolerance Patient tolerated treatment well;Patient limited by fatigue   Behavior During Therapy [facility] for tasks assessed/performed      Past Medical History:  Diagnosis Date  . Anxiety   . Hip dysplasia   . Osteoporosis   . Seizures (HCC)    when she was a child reason not know  . Thyroid disease     Past Surgical History:  Procedure Laterality Date  . TOTAL HIP ARTHROPLASTY Right   . TUBAL LIGATION      There were no vitals filed for this visit.      Subjective Assessment - 02/24/17 1529    Subjective Patient reports she is having less left hip pain today, she continues with popping and weakness right hip.     Limitations Sitting;Standing;Walking   Patient Stated Goals to get back to normal in order to be able to perform daily tasks difficulty   Currently in Pain? No/denies       Gait: increased lateral trunk lean to right, decreased trunk rotation Palpation: right hip with popping and soft tissue rubbing with right hip abduction in side lying and standing  Treatment: Therapeutic exercises: patient performed exercises with guidance/assistance, verbal,tactile cues and demonstration of therapist: Omega knee extension 15# 2 x 15 bilateral LE's Omega kneeflexion#2 x 15 both LE's Rotary hip machine: standing hip abduction with right LE 25#, left 25# x 15reps; hip extension right 70# x 8 reps then 55#  X 15 and left LE 70# x 8  And then  55# x 15 reps  Walk side stepping along balance beam x 2 min Standing hip flexion, tap balance beam, hip flexion and return to floor x 5 each LE  Side lying left:  Assisted hip abduction 5 x 3 second holds Supine lying:  With ball between knees: performed bridging with focus on glute control, setting x 10 reps Dead bug exercises: hip flexion controlled motion with TrA contraction x 10 each LE; alternate UE/LE flexion x 10 reps   Patient response to treatment: Patient demonstrated fatigue with hip abduction in side lying, improved with repetition, improved standing hip extension and abduction with increased resistance; patient required assistance to perform all exercises with correct alignment        PT Education - 02/24/17 1530    Education provided Yes   Education Details HEP: deadbug exercises for hips, arms and alternate LE/UE   Person(s) Educated Patient   Methods Explanation;Verbal cues;Demonstration   Comprehension Verbalized understanding;Returned demonstration;Verbal cues required             PT Long Term Goals - 12/30/16 1332      PT LONG TERM GOAL #1   Title Pt will improve 5x STS to <10 seconds for improved functional mobility by 02/24/2017   Baseline 17.98 sec; current 10/28/16 = 14.4 seconds; 12/30/16 13 seconds     PT LONG TERM GOAL #2   Title  Pt will improve TUG to <10 seconds for improved functional mobility by 02/24/2017   Baseline 27.01 sec with crutches; one crutch 20 seconds 12/30/2016   Status Revised     PT LONG TERM GOAL #3   Title Pt will score >1.0 m/s with LRAD on 10 MW test for improved community mobility  by 3/5/ 2018   Baseline 0.42 m/s with crutches; current 13 seconds (.49m/s: significant improvement: requires additional intervention); 12/30/16 12.3 seconds (,28m/s: improving: still using one crutch)   Status Revised     PT LONG TERM GOAL #4   Title Pt will improve LEFS score to 50/80 by 02/24/2017 demonstrating improved functional mobility     Baseline 14/80; current 25/80 (significant change: goal achieved and goal revised); 12/30/16; 39/80 demonstrating significant improvement: new goal 50/80   Status Revised               Plan - 02/24/17 1600    Clinical Impression Statement Patient is progressing well with strength in left hip and continues with significant weakness in right hip/LE and has difficulty with walking. She demonstrated improved strength with increased resistance with knee flexion/extension, and hip extension. She will benefit from continued physical therapy intervention to achieve maximal strength/function for ambulation.    Rehab Potential Good   PT Frequency 2x / week   PT Duration 8 weeks   PT Treatment/Interventions Gait training;Stair training;Therapeutic activities;Therapeutic exercise;Balance training;Neuromuscular re-education;Patient/family education;Manual techniques;Passive range of motion;Electrical Stimulation   PT Next Visit Plan therapeutic exercises for strength, balance, endurance   PT Home Exercise Plan standing planks on wall, progress to counter as tolerated, continue with supine, side lying strengthening exercises      Patient will benefit from skilled therapeutic intervention in order to improve the following deficits and impairments:  Abnormal gait, Decreased strength, Difficulty walking, Impaired flexibility, Impaired sensation, Pain, Increased muscle spasms, Impaired perceived functional ability  Visit Diagnosis: Muscle weakness (generalized)  Difficulty in walking, not elsewhere classified     Problem List Patient Active Problem List   Diagnosis Date Noted  . Discharge from left nipple 02/12/2017  . Pain of left breast 02/12/2017  . Iron deficiency anemia 09/17/2016  . Status post total replacement of left hip 07/11/2016  . Chronic hypotension 07/02/2016  . Collapse of lung 07/02/2016  . H/O hepatitis 07/02/2016  . History of positive PPD 07/02/2016  . History of seizures  as a child 07/02/2016  . Hypokalemia 07/02/2016  . Acquired dysplasia of left hip 04/19/2016  . Acquired dysplasia of right hip 04/19/2016  . 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 Hypothyroidism 07/17/2015  . Disorder of pelvis 07/17/2015  . OP (osteoporosis) 07/17/2015  . Lumbar radicular pain 08/04/2013  . Lumbar scoliosis 08/04/2013    Beacher May PT 02/24/2017, 5:08 PM  [place] [facility] [address] [place], [address] Phone: [phone]   Fax:  [phone]  Name: Christine Herring MRN: [id number] Date of Birth: July 20, 1986

## 2017-02-26 ENCOUNTER — Encounter: Payer: Self-pay | Admitting: Physical Therapy

## 2017-02-26 ENCOUNTER — Ambulatory Visit: Payer: Managed Care, Other (non HMO) | Admitting: Physical Therapy

## 2017-02-26 DIAGNOSIS — R262 Difficulty in walking, not elsewhere classified: Secondary | ICD-10-CM

## 2017-02-26 DIAGNOSIS — M6281 Muscle weakness (generalized): Secondary | ICD-10-CM | POA: Diagnosis not present

## 2017-02-26 NOTE — Therapy (Signed)
[facility] [address] [place], [address] Phone: [phone]   Fax:  [phone]  Physical Therapy Treatment  Patient Details  Name: Christine Herring MRN: [id number] Date of Birth: 03-Aug-1986 Referring Provider: Dr. Bobby Rumpf  Encounter Date: 02/26/2017      PT End of Session - 02/26/17 1515    Visit Number 45   Number of Visits 74   Date for PT Re-Evaluation 04/09/17   PT Start Time 1510   PT Stop Time 1545   PT Time Calculation (min) 35 min   Activity Tolerance Patient tolerated treatment well;Patient limited by fatigue   Behavior During Therapy [facility] for tasks assessed/performed      Past Medical History:  Diagnosis Date  . Anxiety   . Hip dysplasia   . Osteoporosis   . Seizures ([place])    when she was a child reason not know  . Thyroid disease     Past Surgical History:  Procedure Laterality Date  . TOTAL HIP ARTHROPLASTY Right   . TUBAL LIGATION      There were no vitals filed for this visit.      Subjective Assessment - 02/26/17 1512    Subjective Patient reports no problems from previous session. She reports she felt a little sore in her right hip muscle the following day. She continues with popping in right hip.    Limitations Sitting;Standing;Walking   Patient Stated Goals to get back to normal in order to be able to perform daily tasks difficulty   Currently in Pain? No/denies      Gait: increased lateral trunk lean to right, decreased trunk rotation Palpation: right hip with popping and soft tissue rubbing with right hip abduction in side lying and standing Strength:  Left LE: hip abduction 3+/5, extension 4-/5, ER 3+/5 Right LE: hip abduction 3-/5, extension 3/5, ER 3-/5 Outcome measure: LEFS: 43/80  Treatment: Therapeutic exercises: patient performed exercises with guidance/assistance, verbal,tactile cues and demonstration of therapist: Omega knee extension 20,25# 2 x 15  bilateral LE's Omega knee flexion20# x 15 both LE's Rotary hip machine: standing hip abduction with right LE 40#, left 40# x 10reps; hip extension right 70# x 8 reps then 55#  X 15 and left LE 70# x 8  And then 55# x 15reps  Walk side stepping along balance beam x 2 min Standing hip flexion, tap balance beam, hip flexion and return to floor x 5each LE  Side lying left:  Assisted hip abduction 5 x 3 second holds Supine lying:  With ball between knees: performed bridging with focus on glute control, setting x 10 reps Dead bug exercises: hip flexion controlled motion with TrA contraction x 10 each LE; alternate UE/LE flexion x 10 reps  Patient response to treatment: Patient with improved control right hip abduction with short arc of motion end range, improved strength with knee extension and flexion exercises on OMEGA. Patient required assistance to perform all exercises with correct alignment and technique        PT Education - 02/26/17 1513    Education provided Yes   Education Details HEP; re assessed exercises    Person(s) Educated Patient   Methods Explanation   Comprehension Verbalized understanding             PT Long Term Goals - 02/26/17 1700      PT LONG TERM GOAL #1   Title Pt will improve 5x STS to <10 seconds for improved functional  mobility by 02/24/2017   Baseline 17.98 sec; current 10/28/16 = 14.4 seconds; 12/30/16 13 seconds; 02/26/2017 11 seconds   Status Partially Met     PT LONG TERM GOAL #2   Title Pt will improve TUG to <10 seconds for improved functional mobility by 04/09/2017   Baseline 27.01 sec with crutches; one crutch 20 seconds 12/30/2016; 02/26/17 15 seconds   Status Revised     PT LONG TERM GOAL #3   Title Pt will score >1.0 m/s with LRAD on 10 MW test for improved community mobility  by 4/18/ 2018   Baseline 0.42 m/s with crutches; current 13 seconds (.45ms: significant improvement: requires additional intervention); 12/30/16 12.3 seconds (,857m:  improving: still using one crutch); 12 seconds no AD   Status Revised     PT LONG TERM GOAL #4   Title Pt will improve LEFS score to 50/80 by 04/09/2017 demonstrating improved functional mobility    Baseline 14/80; current 25/80 (significant change: goal achieved and goal revised); 12/30/16; 39/80 demonstrating significant improvement: new goal 50/80; 02/26/17 43/80    Status Revised               Plan - 02/26/17 1545    Clinical Impression Statement Patieint is progressing with improvement in strength in left hip and continues with significant weakness in right hip/LE musculature which limits full function with walking and daily tasks. She will benefit from continued physical therapy intervention to improve strength and endurance to allow maximal functional return.    Rehab Potential Good   Clinical Impairments Affecting Rehab Potential (+) age, motivated, acute condition of recent left hip surgery  (-) hx of hip displasia, multiple surgeries both hips   PT Frequency 2x / week   PT Duration 8 weeks   PT Treatment/Interventions Gait training;Stair training;Therapeutic activities;Therapeutic exercise;Balance training;Neuromuscular re-education;Patient/family education;Manual techniques;Passive range of motion;Electrical Stimulation   PT Next Visit Plan therapeutic exercises for strength, balance, endurance   PT Home Exercise Plan standing planks on wall, progress to counter as tolerated, continue with supine, side lying strengthening exercises   Consulted and Agree with Plan of Care Patient      Patient will benefit from skilled therapeutic intervention in order to improve the following deficits and impairments:  Abnormal gait, Decreased strength, Difficulty walking, Impaired flexibility, Impaired sensation, Pain, Increased muscle spasms, Impaired perceived functional ability  Visit Diagnosis: Muscle weakness (generalized) - Plan: PT plan of care cert/re-cert  Difficulty in walking, not  elsewhere classified - Plan: PT plan of care cert/re-cert     Problem List Patient Active Problem List   Diagnosis Date Noted  . Discharge from left nipple 02/12/2017  . Pain of left breast 02/12/2017  . Iron deficiency anemia 09/17/2016  . Status post total replacement of left hip 07/11/2016  . Chronic hypotension 07/02/2016  . Collapse of lung 07/02/2016  . H/O hepatitis 07/02/2016  . History of positive PPD 07/02/2016  . History of seizures as a child 07/02/2016  . Hypokalemia 07/02/2016  . Acquired dysplasia of left hip 04/19/2016  . Acquired dysplasia of right hip 04/19/2016  . 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 Hypothyroidism 07/17/2015  . Disorder of pelvis 07/17/2015  . OP (osteoporosis) 07/17/2015  . Lumbar radicular pain 08/04/2013  . Lumbar scoliosis 08/04/2013    BrJomarie LongsT 02/27/2017, 10:03 PM  [facility] [id number]: [phone] Fax:  [phone]:  Maggy Wyble MRN: [id number] Date of Birth: 1985-12-26

## 2017-03-03 ENCOUNTER — Ambulatory Visit: Payer: Managed Care, Other (non HMO) | Admitting: Physical Therapy

## 2017-03-03 ENCOUNTER — Encounter: Payer: Self-pay | Admitting: *Deleted

## 2017-03-06 ENCOUNTER — Ambulatory Visit: Payer: Managed Care, Other (non HMO) | Admitting: Physical Therapy

## 2017-03-10 ENCOUNTER — Encounter: Payer: Self-pay | Admitting: Physical Therapy

## 2017-03-10 ENCOUNTER — Ambulatory Visit: Payer: Managed Care, Other (non HMO) | Admitting: Physical Therapy

## 2017-03-10 DIAGNOSIS — M6281 Muscle weakness (generalized): Secondary | ICD-10-CM | POA: Diagnosis not present

## 2017-03-10 DIAGNOSIS — R262 Difficulty in walking, not elsewhere classified: Secondary | ICD-10-CM

## 2017-03-10 NOTE — Therapy (Signed)
[place] [facility] [address] [place], [address] Phone: [phone]   Fax:  [phone]  Physical Therapy Treatment  Patient Details  Name: Christine Herring MRN: [id number] Date of Birth: 03-Dec-1986 Referring Provider: Dr. Bobby Rumpf  Encounter Date: 03/10/2017      PT End of Session - 03/10/17 1534    Visit Number 46   Number of Visits 57   Date for PT Re-Evaluation 04/09/17   PT Start Time 1520   PT Stop Time 1611   PT Time Calculation (min) 51 min   Activity Tolerance Patient tolerated treatment well;Patient limited by fatigue   Behavior During Therapy [facility] for tasks assessed/performed      Past Medical History:  Diagnosis Date  . Anxiety   . Hip dysplasia   . Osteoporosis   . Seizures ([place])    when she was a child reason not know  . Thyroid disease     Past Surgical History:  Procedure Laterality Date  . TOTAL HIP ARTHROPLASTY Right   . TUBAL LIGATION      There were no vitals filed for this visit.      Subjective Assessment - 03/10/17 1528    Subjective Patient reports she had CT scan and she is now going to have to re take the CT scan for additional pictures. This weekend patient went to [employer] and she walked a lot and her right hip began burning, over fatigued her leg and she had to take medication to relieve her pain.    Limitations Sitting;Standing;Walking   Patient Stated Goals to get back to normal in order to be able to perform daily tasks difficulty   Currently in Pain? No/denies      Gait: increased lateral trunk flexion to right with stance phase right LE, decreased trunk rotation   Treatment: Therapeutic exercises: patient performed exercises with guidance/assistance, verbal,tactile cues and demonstration of therapist: Seated bilateral knee extension with 4# ankle weights 2 x 15 Seated bilateral knee flexion 2 x 25 reps blue resistive band with assistance Hip  abduction/ER with manual resistance given x 15 reps Rotary hip machine: standing hip abduction with right LE 40#, left 40# x 10reps; hip extension right 70#x  15 reps, left x 10 reps due to right LE weakness Walk side stepping along balance beam x 2 min Standing hip flexion, tap balance beam, hip flexion and return to floor x 5each LE     Diagonal march with (2) hand held dumbbells 6# x 2 min.    Standing ball toss with 4# ball left LE on BOSU ball, 15 tosses     Patient response to treatment: patient with improved technique and motor control with all standing and walking exercises with repetition and minimal VC to perform exercises with correct alignment.        PT Education - 03/10/17 1533    Education provided Yes   Education Details HEP: continue with exercises for hip strengthening   Person(s) Educated Patient   Methods Explanation   Comprehension Verbalized understanding             PT Long Term Goals - 02/26/17 1700      PT LONG TERM GOAL #1   Title Pt will improve 5x STS to <10 seconds for improved functional mobility by 02/24/2017   Baseline 17.98 sec; current 10/28/16 = 14.4 seconds; 12/30/16 13 seconds; 02/26/2017 11 seconds   Status Partially Met     PT LONG  TERM GOAL #2   Title Pt will improve TUG to <10 seconds for improved functional mobility by 04/09/2017   Baseline 27.01 sec with crutches; one crutch 20 seconds 12/30/2016; 02/26/17 15 seconds   Status Revised     PT LONG TERM GOAL #3   Title Pt will score >1.0 m/s with LRAD on 10 MW test for improved community mobility  by 4/18/ 2018   Baseline 0.42 m/s with crutches; current 13 seconds (.104ms: significant improvement: requires additional intervention); 12/30/16 12.3 seconds (,862m: improving: still using one crutch); 12 seconds no AD   Status Revised     PT LONG TERM GOAL #4   Title Pt will improve LEFS score to 50/80 by 04/09/2017 demonstrating improved functional mobility    Baseline 14/80; current 25/80  (significant change: goal achieved and goal revised); 12/30/16; 39/80 demonstrating significant improvement: new goal 50/80; 02/26/17 43/80    Status Revised               Plan - 03/10/17 1534    Clinical Impression Statement Patient is progressing with improved strength in left LE and continues with weakness right hip/LE that limits her abiltiy to walk and perform daily tasks without difficulty.    Rehab Potential Good   Clinical Impairments Affecting Rehab Potential (+) age, motivated, acute condition of recent left hip surgery  (-) hx of hip displasia, multiple surgeries both hips   PT Frequency 2x / week   PT Duration 8 weeks   PT Treatment/Interventions Gait training;Stair training;Therapeutic activities;Therapeutic exercise;Balance training;Neuromuscular re-education;Patient/family education;Manual techniques;Passive range of motion;Electrical Stimulation   PT Next Visit Plan therapeutic exercises for strength, balance, endurance   PT Home Exercise Plan standing planks on wall, progress to counter as tolerated, continue with supine, side lying strengthening exercises   Consulted and Agree with Plan of Care Patient      Patient will benefit from skilled therapeutic intervention in order to improve the following deficits and impairments:  Abnormal gait, Decreased strength, Difficulty walking, Impaired flexibility, Impaired sensation, Pain, Increased muscle spasms, Impaired perceived functional ability  Visit Diagnosis: Muscle weakness (generalized)  Difficulty in walking, not elsewhere classified     Problem List Patient Active Problem List   Diagnosis Date Noted  . Discharge from left nipple 02/12/2017  . Pain of left breast 02/12/2017  . Iron deficiency anemia 09/17/2016  . Status post total replacement of left hip 07/11/2016  . Chronic hypotension 07/02/2016  . Collapse of lung 07/02/2016  . H/O hepatitis 07/02/2016  . History of positive PPD 07/02/2016  . History of  seizures as a child 07/02/2016  . Hypokalemia 07/02/2016  . Acquired dysplasia of left hip 04/19/2016  . Acquired dysplasia of right hip 04/19/2016  . 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 Hypothyroidism 07/17/2015  . Disorder of pelvis 07/17/2015  . OP (osteoporosis) 07/17/2015  . Lumbar radicular pain 08/04/2013  . Lumbar scoliosis 08/04/2013    BrJomarie LongsT 03/11/2017, 8:25 PM  [facility] [id number]: [phone] Fax:  [phone]: SiIla LandowskiRN: [id number] of Birth: [birth year]

## 2017-03-13 ENCOUNTER — Encounter: Payer: Self-pay | Admitting: Physical Therapy

## 2017-03-13 ENCOUNTER — Ambulatory Visit: Payer: Managed Care, Other (non HMO) | Admitting: Physical Therapy

## 2017-03-13 DIAGNOSIS — R262 Difficulty in walking, not elsewhere classified: Secondary | ICD-10-CM

## 2017-03-13 DIAGNOSIS — M25551 Pain in right hip: Secondary | ICD-10-CM

## 2017-03-13 DIAGNOSIS — M6281 Muscle weakness (generalized): Secondary | ICD-10-CM | POA: Diagnosis not present

## 2017-03-14 NOTE — Therapy (Signed)
[place] [facility] [facility] [address], [place], [address] Phone: [phone]   Fax:  [phone]  Physical Therapy Treatment  Patient Details  Name: Christine Herring MRN: [id number] Date of Birth: 1986/02/23 Referring Provider: Dr. Bobby Rumpf  Encounter Date: 03/13/2017      PT End of Session - 03/13/17 1519    Visit Number 37   Number of Visits 11   Date for PT Re-Evaluation 04/09/17   PT Start Time 1505   PT Stop Time 1545   PT Time Calculation (min) 40 min   Activity Tolerance Patient tolerated treatment well;Patient limited by fatigue;Patient limited by pain   Behavior During Therapy [facility] for tasks assessed/performed      Past Medical History:  Diagnosis Date  . Anxiety   . Hip dysplasia   . Osteoporosis   . Seizures ([place])    when she was a child reason not know  . Thyroid disease     Past Surgical History:  Procedure Laterality Date  . TOTAL HIP ARTHROPLASTY Right   . TUBAL LIGATION      There were no vitals filed for this visit.      Subjective Assessment - 03/13/17 1508    Subjective Patient reports increased right hip pain that kept her up all night. She reports the pain is intermittent and began yesterday morning and worsened over the day. She still feels left hip/LE is weak and right hip pain is preventing her from walking well today.   Limitations Sitting;Standing;Walking   Patient Stated Goals to get back to normal in order to be able to perform daily tasks difficulty   Currently in Pain? Yes  no pain in left hip   Pain Score 2    Pain Location Hip   Pain Orientation Right   Pain Descriptors / Indicators Burning   Pain Type Acute pain   Pain Onset Yesterday   Pain Frequency Intermittent      Objective:  AAROM right hip flexion WNL with pain anterior aspect right hip at end range, increased pain with ER right hip Gait: antalgic with limp on right LE  Therapeutic exercise: Patient performed  with guidance/assistance, verbal and tactile cues and demonstration of therapist: Supine lying: Bridging with ball between knees with glute sets and partial bridging as tolerated x 10 reps Dead bug marching single leg right LE x 10 with TrA contraction LTR with 45cm ball under LE's x 10 reps with controlled motion Bilateral shoulder flexion overhead holding 6# dumbbell x 15 reps with TrA contraction  Side lying left with pillows between knees: moist heat pack applied to right hip/thigh x 15 min. For pain control; no adverse reaction noted  Patient resIponse to treatment: Patient demonstrated improved technique with exercises with minimal VC for correct alignment. Patient with minimal change in pain in right hip, no increased pain reported with exercises.           PT Education - 03/13/17 1514    Education provided Yes   Education Details use of ice/heat to control pain in hip and continue with ROM, strengthening   Person(s) Educated Patient   Methods Explanation   Comprehension Verbalized understanding             PT Long Term Goals - 02/26/17 1700      PT LONG TERM GOAL #1   Title Pt will improve 5x STS to <10 seconds for improved functional mobility by 02/24/2017   Baseline 17.98 sec;  current 10/28/16 = 14.4 seconds; 12/30/16 13 seconds; 02/26/2017 11 seconds   Status Partially Met     PT LONG TERM GOAL #2   Title Pt will improve TUG to <10 seconds for improved functional mobility by 04/09/2017   Baseline 27.01 sec with crutches; one crutch 20 seconds 12/30/2016; 02/26/17 15 seconds   Status Revised     PT LONG TERM GOAL #3   Title Pt will score >1.0 m/s with LRAD on 10 MW test for improved community mobility  by 4/18/ 2018   Baseline 0.42 m/s with crutches; current 13 seconds (.47ms: significant improvement: requires additional intervention); 12/30/16 12.3 seconds (,845m: improving: still using one crutch); 12 seconds no AD   Status Revised     PT LONG TERM GOAL #4   Title  Pt will improve LEFS score to 50/80 by 04/09/2017 demonstrating improved functional mobility    Baseline 14/80; current 25/80 (significant change: goal achieved and goal revised); 12/30/16; 39/80 demonstrating significant improvement: new goal 50/80; 02/26/17 43/80    Status Revised               Plan - 03/13/17 1519    Clinical Impression Statement Patient demonstrated decreased pain in rigth and left hip following treatment with improved gait pattern and ability to walk with less difficulty. She continues with pain and decreased strength as primary limitations to functional ambulation and will benefit from continued physical therapy intervention.    Rehab Potential Good   Clinical Impairments Affecting Rehab Potential (+) age, motivated, acute condition of recent left hip surgery  (-) hx of hip displasia, multiple surgeries both hips   PT Frequency 2x / week   PT Duration 8 weeks   PT Treatment/Interventions Gait training;Stair training;Therapeutic activities;Therapeutic exercise;Balance training;Neuromuscular re-education;Patient/family education;Manual techniques;Passive range of motion;Electrical Stimulation   PT Next Visit Plan therapeutic exercises for strength, balance, endurance   PT Home Exercise Plan standing planks on wall, progress to counter as tolerated, continue with supine, side lying strengthening exercises   Consulted and Agree with Plan of Care Patient      Patient will benefit from skilled therapeutic intervention in order to improve the following deficits and impairments:  Abnormal gait, Decreased strength, Difficulty walking, Impaired flexibility, Impaired sensation, Pain, Increased muscle spasms, Impaired perceived functional ability  Visit Diagnosis: Difficulty in walking, not elsewhere classified  Muscle weakness (generalized)  Pain in right hip     Problem List Patient Active Problem List   Diagnosis Date Noted  . Discharge from left nipple 02/12/2017   . Pain of left breast 02/12/2017  . Iron deficiency anemia 09/17/2016  . Status post total replacement of left hip 07/11/2016  . Chronic hypotension 07/02/2016  . Collapse of lung 07/02/2016  . H/O hepatitis 07/02/2016  . History of positive PPD 07/02/2016  . History of seizures as a child 07/02/2016  . Hypokalemia 07/02/2016  . Acquired dysplasia of left hip 04/19/2016  . Acquired dysplasia of right hip 04/19/2016  . 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 Hypothyroidism 07/17/2015  . Disorder of pelvis 07/17/2015  . OP (osteoporosis) 07/17/2015  . Lumbar radicular pain 08/04/2013  . Lumbar scoliosis 08/04/2013    BrJomarie LongsT 03/14/2017, 4:15 PM  [facility] [address]one: [phone] Fax:  [phone]: SiEdla ParaRN: [id number] of Birth: 1/September 08, 1986

## 2017-03-17 ENCOUNTER — Ambulatory Visit: Payer: Managed Care, Other (non HMO) | Admitting: Physical Therapy

## 2017-03-20 ENCOUNTER — Ambulatory Visit: Payer: Managed Care, Other (non HMO) | Admitting: Physical Therapy

## 2017-03-21 ENCOUNTER — Ambulatory Visit: Payer: Managed Care, Other (non HMO) | Admitting: Physical Therapy

## 2017-03-21 ENCOUNTER — Encounter: Payer: Self-pay | Admitting: Physical Therapy

## 2017-03-21 DIAGNOSIS — M6281 Muscle weakness (generalized): Secondary | ICD-10-CM | POA: Diagnosis not present

## 2017-03-21 DIAGNOSIS — R262 Difficulty in walking, not elsewhere classified: Secondary | ICD-10-CM

## 2017-03-21 NOTE — Therapy (Signed)
[facility] [address] [place], [address] Phone: [phone]   Fax:  [phone]  Physical Therapy Treatment  Patient Details  Name: Christine Herring MRN: [id number] Date of Birth: 10-Jul-1986 Referring Provider: Dr. Bobby Rumpf  Encounter Date: 03/21/2017      PT End of Session - 03/21/17 1210    Visit Number 48   Number of Visits 57   Date for PT Re-Evaluation 04/09/17   PT Start Time 1203   PT Stop Time 1250   PT Time Calculation (min) 47 min   Activity Tolerance Patient tolerated treatment well;Patient limited by fatigue;Patient limited by pain   Behavior During Therapy [facility] for tasks assessed/performed      Past Medical History:  Diagnosis Date  . Anxiety   . Hip dysplasia   . Osteoporosis   . Seizures ([place])    when she was a child reason not know  . Thyroid disease     Past Surgical History:  Procedure Laterality Date  . TOTAL HIP ARTHROPLASTY Right   . TUBAL LIGATION      There were no vitals filed for this visit.      Subjective Assessment - 03/21/17 1207    Subjective Patient reports she is having continued hip pain, right hip burning and pain with popping intermittently, left hip/LE more weakness than pain.   Limitations Sitting;Standing;Walking   Patient Stated Goals to get back to normal in order to be able to perform daily tasks difficulty   Currently in Pain? No/denies   Pain Onset --      Objective: Strength: right hip weakness hip extension, abduction and rotations 3-/5 Left hip extension 4-/5, abduction 4-/5, ER 4-/5 Gait: antalgic, limp on right, decreased muscle activation right hip musculature, decreased step length right>left LE  Treatment: Therapeutic exercise: patient performed exercises with verbal, tactile cues and demonstration of therapist: Sitting: Hip abduction withmanual resistance, LE's apart, hold 5 seconds x 10 reps Knee extension with   5 # 2 x 15  reps Knee flexion with resistive band 2 x 20 reps  Standing: At wall: Instructed with demonstration and VC bilateral hip abduction x 3 reps each 5 second holds, hip extension x 5 reps with 3-5 second holds  Hip rotary machine: Hip extension isometric 70# x 5 reps each LE Hip abduction isometric 55# x 5 reps each LE  Standing ball toss with each LE supported on large balance stone 4# ball toss x 20 each LE Incline walk (~25 feet) with 4# weight held in each hand 3 sets Side incline walk (~15 feet)  without weight x 1 set   Patient response to treatment: patient demonstrated improved technique with exercises with minimal VC for correct alignment. Patient with increased effort with right hip standing, exercises due to weakness.   Improved motor control with repetition and cuing.         PT Education - 03/21/17 1209    Education provided Yes   Education Details HEP for isometric exercises for both hips, standing and walking exercises including incline walking   Person(s) Educated Patient   Methods Explanation   Comprehension Verbalized understanding             PT Long Term Goals - 02/26/17 1700      PT LONG TERM GOAL #1   Title Pt will improve 5x STS to <10 seconds for improved functional mobility by 02/24/2017   Baseline 17.98 sec; current 10/28/16 = 14.4 seconds;  12/30/16 13 seconds; 02/26/2017 11 seconds   Status Partially Met     PT LONG TERM GOAL #2   Title Pt will improve TUG to <10 seconds for improved functional mobility by 04/09/2017   Baseline 27.01 sec with crutches; one crutch 20 seconds 12/30/2016; 02/26/17 15 seconds   Status Revised     PT LONG TERM GOAL #3   Title Pt will score >1.0 m/s with LRAD on 10 MW test for improved community mobility  by 4/18/ 2018   Baseline 0.42 m/s with crutches; current 13 seconds (.66ms: significant improvement: requires additional intervention); 12/30/16 12.3 seconds (,878m: improving: still using one crutch); 12 seconds no AD    Status Revised     PT LONG TERM GOAL #4   Title Pt will improve LEFS score to 50/80 by 04/09/2017 demonstrating improved functional mobility    Baseline 14/80; current 25/80 (significant change: goal achieved and goal revised); 12/30/16; 39/80 demonstrating significant improvement: new goal 50/80; 02/26/17 43/80    Status Revised               Plan - 03/21/17 1211    Clinical Impression Statement Patient continues with right hip pain and weakness that is primary limiting factor to walking without difficulty. Her left LE continues with decreased strength and endurance and she requires guidance and instruction to appropriately perform exercises including modifications as needed and will therefore benefit from continued physical therapy intervention to achieve goals.     Rehab Potential Good   Clinical Impairments Affecting Rehab Potential (+) age, motivated, acute condition of recent left hip surgery  (-) hx of hip displasia, multiple surgeries both hips   PT Frequency 2x / week   PT Duration 8 weeks   PT Treatment/Interventions Gait training;Stair training;Therapeutic activities;Therapeutic exercise;Balance training;Neuromuscular re-education;Patient/family education;Manual techniques;Passive range of motion;Electrical Stimulation   PT Next Visit Plan therapeutic exercises for strength, balance, endurance   PT Home Exercise Plan standing planks on wall, progress to counter as tolerated, continue with supine, side lying strengthening exercises      Patient will benefit from skilled therapeutic intervention in order to improve the following deficits and impairments:  Abnormal gait, Decreased strength, Difficulty walking, Impaired flexibility, Impaired sensation, Pain, Increased muscle spasms, Impaired perceived functional ability  Visit Diagnosis: Muscle weakness (generalized)  Difficulty in walking, not elsewhere classified     Problem List Patient Active Problem List   Diagnosis  Date Noted  . Discharge from left nipple 02/12/2017  . Pain of left breast 02/12/2017  . Iron deficiency anemia 09/17/2016  . Status post total replacement of left hip 07/11/2016  . Chronic hypotension 07/02/2016  . Collapse of lung 07/02/2016  . H/O hepatitis 07/02/2016  . History of positive PPD 07/02/2016  . History of seizures as a child 07/02/2016  . Hypokalemia 07/02/2016  . Acquired dysplasia of left hip 04/19/2016  . Acquired dysplasia of right hip 04/19/2016  . 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 Hypothyroidism 07/17/2015  . Disorder of pelvis 07/17/2015  . OP (osteoporosis) 07/17/2015  . Lumbar radicular pain 08/04/2013  . Lumbar scoliosis 08/04/2013    BrJomarie LongsT 03/21/2017, 1:00 PM  [facility] [id number]: [phone] Fax:  [phone]: Christine OhalloranRN: [id number] of Birth: [birth year]

## 2017-03-25 ENCOUNTER — Ambulatory Visit: Payer: Managed Care, Other (non HMO) | Attending: Orthopedic Surgery | Admitting: Physical Therapy

## 2017-03-25 ENCOUNTER — Encounter: Payer: Self-pay | Admitting: Physical Therapy

## 2017-03-25 DIAGNOSIS — M25551 Pain in right hip: Secondary | ICD-10-CM | POA: Insufficient documentation

## 2017-03-25 DIAGNOSIS — R262 Difficulty in walking, not elsewhere classified: Secondary | ICD-10-CM | POA: Diagnosis present

## 2017-03-25 DIAGNOSIS — M6281 Muscle weakness (generalized): Secondary | ICD-10-CM

## 2017-03-26 NOTE — Therapy (Signed)
[facility] [address] [place], [address] Phone: [phone]   Fax:  [phone]  Physical Therapy Treatment  Patient Details  Name: Christine Herring MRN: [id number] Date of Birth: [birth year] Referring Provider: Dr. Bobby Rumpf  Encounter Date: 03/25/2017      PT End of Session - 03/25/17 1513    Visit Number 83   Number of Visits 24   Date for PT Re-Evaluation 04/09/17   PT Start Time 1505   PT Stop Time 1545   PT Time Calculation (min) 40 min   Activity Tolerance Patient tolerated treatment well;Patient limited by fatigue;Patient limited by pain   Behavior During Therapy [facility] for tasks assessed/performed      Past Medical History:  Diagnosis Date  . Anxiety   . Hip dysplasia   . Osteoporosis   . Seizures ([place])    when she was a child reason not know  . Thyroid disease     Past Surgical History:  Procedure Laterality Date  . TOTAL HIP ARTHROPLASTY Right   . TUBAL LIGATION      There were no vitals filed for this visit.      Subjective Assessment - 03/25/17 1505    Subjective Paitent reports she continues with right hip pain and is waiting for additional CT scan to complete hip assessment. She is having burning into right hip and LE and last night she could not relieve her pain.     Limitations Sitting;Standing;Walking   Patient Stated Goals to get back to normal in order to be able to perform daily tasks difficulty   Currently in Pain? Other (Comment)  right hip and LE worse than left; at least 8/10      Objective: Gait: antalgic, slow cadence, guarded posture on arrival to therapy Palpation: difficulty to localize pain in right hip region, patient reports there is no comfortable position  Treatment; Modalities: Electrical stimulation: High volt estim.clincial program for muscle spasms  (4) electrodes applied to right hip; anterior, lateral, posterior and quadriceps; intensity to tolerance  with patient in reclined position with LE supported on pillow and ice pack applied to same x 25 min. goal: pain, spasms  Patient response to treatment: Patient with decreased pain from 8 /10 to 3/10 following ice/estim. Treatment and able to stand and weight bear through right LE without increased pain.           PT Education - 03/25/17 1610    Education provided Yes   Education Details Instructed to rest, use ice and exercise as tolerated   Person(s) Educated Patient   Methods Explanation   Comprehension Verbalized understanding             PT Long Term Goals - 02/26/17 1700      PT LONG TERM GOAL #1   Title Pt will improve 5x STS to <10 seconds for improved functional mobility by 02/24/2017   Baseline 17.98 sec; current 10/28/16 = 14.4 seconds; 12/30/16 13 seconds; 02/26/2017 11 seconds   Status Partially Met     PT LONG TERM GOAL #2   Title Pt will improve TUG to <10 seconds for improved functional mobility by 04/09/2017   Baseline 27.01 sec with crutches; one crutch 20 seconds 12/30/2016; 02/26/17 15 seconds   Status Revised     PT LONG TERM GOAL #3   Title Pt will score >1.0 m/s with LRAD on 10 MW test for improved community mobility  by 4/18/ 2018  Baseline 0.42 m/s with crutches; current 13 seconds (.25ms: significant improvement: requires additional intervention); 12/30/16 12.3 seconds (,89m: improving: still using one crutch); 12 seconds no AD   Status Revised     PT LONG TERM GOAL #4   Title Pt will improve LEFS score to 50/80 by 04/09/2017 demonstrating improved functional mobility    Baseline 14/80; current 25/80 (significant change: goal achieved and goal revised); 12/30/16; 39/80 demonstrating significant improvement: new goal 50/80; 02/26/17 43/80    Status Revised               Plan - 03/25/17 1515    Clinical Impression Statement Patient with increased hip pain right LE today and unable to perform exercise therefore focused on pain control with ice to hips  and estim with good results of decreased pain on standing and walking indicating inflammation.  She should continue to improve with physical therapy intervention.    Rehab Potential Good   Clinical Impairments Affecting Rehab Potential (+) age, motivated, acute condition of recent left hip surgery  (-) hx of hip displasia, multiple surgeries both hips   PT Frequency 2x / week   PT Duration 8 weeks   PT Treatment/Interventions Gait training;Stair training;Therapeutic activities;Therapeutic exercise;Balance training;Neuromuscular re-education;Patient/family education;Manual techniques;Passive range of motion;Electrical Stimulation   PT Next Visit Plan therapeutic exercises for strength, balance, endurance   PT Home Exercise Plan standing planks on wall, progress to counter as tolerated, continue with supine, side lying strengthening exercises      Patient will benefit from skilled therapeutic intervention in order to improve the following deficits and impairments:  Abnormal gait, Decreased strength, Difficulty walking, Impaired flexibility, Impaired sensation, Pain, Increased muscle spasms, Impaired perceived functional ability  Visit Diagnosis: Muscle weakness (generalized)  Difficulty in walking, not elsewhere classified  Pain in right hip     Problem List Patient Active Problem List   Diagnosis Date Noted  . Discharge from left nipple 02/12/2017  . Pain of left breast 02/12/2017  . Iron deficiency anemia 09/17/2016  . Status post total replacement of left hip 07/11/2016  . Chronic hypotension 07/02/2016  . Collapse of lung 07/02/2016  . H/O hepatitis 07/02/2016  . History of positive PPD 07/02/2016  . History of seizures as a child 07/02/2016  . Hypokalemia 07/02/2016  . Acquired dysplasia of left hip 04/19/2016  . Acquired dysplasia of right hip 04/19/2016  . 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 Hypothyroidism 07/17/2015  . Disorder of pelvis  07/17/2015  . OP (osteoporosis) 07/17/2015  . Lumbar radicular pain 08/04/2013  . Lumbar scoliosis 08/04/2013    BrJomarie LongsT 03/26/2017, 7:12 PM  [place] [facility] [id number]: [phone] Fax:  [phone]: Christine MiramontesRN: [id number] of Birth: 12/1985-06-11

## 2017-03-27 ENCOUNTER — Ambulatory Visit: Payer: Managed Care, Other (non HMO) | Admitting: Physical Therapy

## 2017-04-01 ENCOUNTER — Ambulatory Visit: Payer: Managed Care, Other (non HMO) | Admitting: Physical Therapy

## 2017-04-03 ENCOUNTER — Ambulatory Visit: Payer: Managed Care, Other (non HMO) | Admitting: Physical Therapy

## 2017-04-03 ENCOUNTER — Encounter: Payer: Self-pay | Admitting: Physical Therapy

## 2017-04-03 DIAGNOSIS — M6281 Muscle weakness (generalized): Secondary | ICD-10-CM | POA: Diagnosis not present

## 2017-04-03 DIAGNOSIS — M25551 Pain in right hip: Secondary | ICD-10-CM

## 2017-04-03 DIAGNOSIS — R262 Difficulty in walking, not elsewhere classified: Secondary | ICD-10-CM

## 2017-04-03 NOTE — Therapy (Signed)
[place] [address], [place], [address] Phone: [phone]   Fax:  [phone]  Physical Therapy Treatment  Patient Details  Name: Wilberta Dorvil MRN: [id number] Date of Birth: 03-01-1986 Referring Provider: Dr. Bobby Rumpf  Encounter Date: 04/03/2017      PT End of Session - 04/03/17 1512    Visit Number 50   Number of Visits 36   Date for PT Re-Evaluation 04/09/17   PT Start Time 3419   PT Stop Time 1555   PT Time Calculation (min) 48 min   Activity Tolerance Patient tolerated treatment well;Patient limited by fatigue;Patient limited by pain   Behavior During Therapy [facility] for tasks assessed/performed      Past Medical History:  Diagnosis Date  . Anxiety   . Hip dysplasia   . Osteoporosis   . Seizures ([place])    when she was a child reason not know  . Thyroid disease     Past Surgical History:  Procedure Laterality Date  . TOTAL HIP ARTHROPLASTY Right   . TUBAL LIGATION      There were no vitals filed for this visit.      Subjective Assessment - 04/03/17 1509    Subjective Patient reports she continues with pain in right hip that is limiting her abiltiy to walk and perform daily tasks and is limiting abiltiy to ambulate without difficulty. Has CT scan scheduled at [facility] next week for right hip. She continues to feel increased weakness in left hip due to using having to support right leg during daily tasks.    Limitations Sitting;Standing;Walking   Patient Stated Goals to get back to normal in order to be able to perform daily tasks difficulty   Currently in Pain? Other (Comment)  right hip pain, left hip pain mild        Objective: Gait: antalgic, slow cadence, guarded posture on arrival to therapy Strength: left hip flexion 3+/5, abduction 3+/5, ER 3/5, IR 4/5 Right hip flexion, abduction, ER, IR unable to accurately assess due to pain  Treatment; Therapeutic exercise: patient performed  with verbal, tactile cues, assistance of therapist Supine lying: Patient performed with assistance:10 reps 5 second holds isometric hip flexion, abduction, extension each LE  Modalities: Electrical stimulation: High volt estim.clincial program for muscle spasms  (4) electrodes applied to right hip; anterior, lateral, posterior and quadriceps; intensity to tolerance with patient in reclined position with LE supported on pillow and ice pack applied to same x 20 min. goal: pain, spasms  Patient response to treatment: Patient with decreased pain to <2/10 and improved strength in both hips with isometric exercises and light to moderate resistance given. Improved gait pattern with minimal to no pain in hips and able to walk with mild to no limp at end of session.           PT Education - 04/03/17 1510    Education provided Yes   Education Details HEP for isometric hip abduciton, ER, hip extension   Person(s) Educated Patient   Methods Explanation;Demonstration;Verbal cues   Comprehension Verbalized understanding;Returned demonstration;Verbal cues required             PT Long Term Goals - 02/26/17 1700      PT LONG TERM GOAL #1   Title Pt will improve 5x STS to <10 seconds for improved functional mobility by 02/24/2017   Baseline 17.98 sec; current 10/28/16 = 14.4 seconds; 12/30/16 13 seconds; 02/26/2017 11 seconds   Status  Partially Met     PT LONG TERM GOAL #2   Title Pt will improve TUG to <10 seconds for improved functional mobility by 04/09/2017   Baseline 27.01 sec with crutches; one crutch 20 seconds 12/30/2016; 02/26/17 15 seconds   Status Revised     PT LONG TERM GOAL #3   Title Pt will score >1.0 m/s with LRAD on 10 MW test for improved community mobility  by 4/18/ 2018   Baseline 0.42 m/s with crutches; current 13 seconds (.80ms: significant improvement: requires additional intervention); 12/30/16 12.3 seconds (,87m: improving: still using one crutch); 12 seconds no AD   Status  Revised     PT LONG TERM GOAL #4   Title Pt will improve LEFS score to 50/80 by 04/09/2017 demonstrating improved functional mobility    Baseline 14/80; current 25/80 (significant change: goal achieved and goal revised); 12/30/16; 39/80 demonstrating significant improvement: new goal 50/80; 02/26/17 43/80    Status Revised               Plan - 04/03/17 1513    Clinical Impression Statement Patient demonstrated decreased pain in hips and was able to progress with strengthening exercises with good results and improved ability to walk with less difficulty at end of session. She continues with weakness that requires guidance and assistance to improve and will benefit from additional physical therapy intervention    Rehab Potential Good   Clinical Impairments Affecting Rehab Potential (+) age, motivated, acute condition of recent left hip surgery  (-) hx of hip displasia, multiple surgeries both hips   PT Frequency 2x / week   PT Duration 8 weeks   PT Treatment/Interventions Gait training;Stair training;Therapeutic activities;Therapeutic exercise;Balance training;Neuromuscular re-education;Patient/family education;Manual techniques;Passive range of motion;Electrical Stimulation   PT Next Visit Plan therapeutic exercises for strength, balance, endurance   PT Home Exercise Plan standing planks on wall, progress to counter as tolerated, continue with supine, side lying strengthening exercises      Patient will benefit from skilled therapeutic intervention in order to improve the following deficits and impairments:  Abnormal gait, Decreased strength, Difficulty walking, Impaired flexibility, Impaired sensation, Pain, Increased muscle spasms, Impaired perceived functional ability  Visit Diagnosis: Difficulty in walking, not elsewhere classified  Muscle weakness (generalized)  Pain in right hip     Problem List Patient Active Problem List   Diagnosis Date Noted  . Discharge from left  nipple 02/12/2017  . Pain of left breast 02/12/2017  . Iron deficiency anemia 09/17/2016  . Status post total replacement of left hip 07/11/2016  . Chronic hypotension 07/02/2016  . Collapse of lung 07/02/2016  . H/O hepatitis 07/02/2016  . History of positive PPD 07/02/2016  . History of seizures as a child 07/02/2016  . Hypokalemia 07/02/2016  . Acquired dysplasia of left hip 04/19/2016  . Acquired dysplasia of right hip 04/19/2016  . 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 Hypothyroidism 07/17/2015  . Disorder of pelvis 07/17/2015  . OP (osteoporosis) 07/17/2015  . Lumbar radicular pain 08/04/2013  . Lumbar scoliosis 08/04/2013    BrJomarie LongsT 04/04/2017, 5:50 PM  [facility] [id number]: [phone] Fax:  [phone]: SiYesly GeretyRN: [id number] of Birth: 1/12-29-1987

## 2017-04-07 ENCOUNTER — Ambulatory Visit: Payer: Managed Care, Other (non HMO) | Admitting: Physical Therapy

## 2017-04-07 ENCOUNTER — Encounter: Payer: Self-pay | Admitting: Physical Therapy

## 2017-04-07 DIAGNOSIS — M6281 Muscle weakness (generalized): Secondary | ICD-10-CM | POA: Diagnosis not present

## 2017-04-07 DIAGNOSIS — R262 Difficulty in walking, not elsewhere classified: Secondary | ICD-10-CM

## 2017-04-07 NOTE — Therapy (Signed)
[place] [facility] [address] [place], [address] Phone: [phone]   Fax:  [phone]  Physical Therapy Treatment  Patient Details  Name: Christine Herring MRN: [id number] Date of Birth: August 22, 1986 Referring Provider: Dr. Bobby Rumpf  Encounter Date: 04/07/2017      PT End of Session - 04/07/17 1513    Visit Number 51   Number of Visits 80   Date for PT Re-Evaluation 04/09/17   PT Start Time 4081   PT Stop Time 1545   PT Time Calculation (min) 38 min   Activity Tolerance Patient tolerated treatment well;Patient limited by fatigue;Patient limited by pain   Behavior During Therapy [facility] for tasks assessed/performed      Past Medical History:  Diagnosis Date  . Anxiety   . Hip dysplasia   . Osteoporosis   . Seizures ([place])    when she was a child reason not know  . Thyroid disease     Past Surgical History:  Procedure Laterality Date  . TOTAL HIP ARTHROPLASTY Right   . TUBAL LIGATION      There were no vitals filed for this visit.      Subjective Assessment - 04/07/17 1508    Subjective Patient reports she is having less pain in right and left hip with improving weather. Prolonged standing increases hip pain, discomfort.    Limitations Sitting;Standing;Walking   Patient Stated Goals to get back to normal in order to be able to perform daily tasks difficulty   Currently in Pain? Other (Comment)  mild discomfort right and left hip       Objective: Gait: Improved gait pattern with increased weight shift to right/left with decreased limp from previous session Strength: left hip flexion 4/5, abduction 3+/5, ER 3+/5, IR 4/5 Right hip flexion, abduction, ER, IR grossly at least 3+/5 with less pain from previous session  Treatment; Therapeutic exercise: patient performed with verbal, tactile cues, assistance of therapist Supine lying: Patient performed with assistance:10 reps 5 second holds isometric hip  flexion, abduction, extension each LE  NuStep x 10-15 min. Level #4 workload (independent) without increased pain in hips reported at end of session  Patient response to treatment: Improved strength with increased resistance given to all planes of motion each LE today. Improved endurance and ability to perform Nustep without increased pain reported.          PT Education - 04/07/17 1512    Education provided Yes   Education Details reinforced home program, isometric hip exercises   Methods Explanation;Demonstration   Comprehension Verbalized understanding             PT Long Term Goals - 02/26/17 1700      PT LONG TERM GOAL #1   Title Pt will improve 5x STS to <10 seconds for improved functional mobility by 02/24/2017   Baseline 17.98 sec; current 10/28/16 = 14.4 seconds; 12/30/16 13 seconds; 02/26/2017 11 seconds   Status Partially Met     PT LONG TERM GOAL #2   Title Pt will improve TUG to <10 seconds for improved functional mobility by 04/09/2017   Baseline 27.01 sec with crutches; one crutch 20 seconds 12/30/2016; 02/26/17 15 seconds   Status Revised     PT LONG TERM GOAL #3   Title Pt will score >1.0 m/s with LRAD on 10 MW test for improved community mobility  by 4/18/ 2018   Baseline 0.42 m/s with crutches; current 13 seconds (.41ms: significant improvement: requires  additional intervention); 12/30/16 12.3 seconds (,82ms: improving: still using one crutch); 12 seconds no AD   Status Revised     PT LONG TERM GOAL #4   Title Pt will improve LEFS score to 50/80 by 04/09/2017 demonstrating improved functional mobility    Baseline 14/80; current 25/80 (significant change: goal achieved and goal revised); 12/30/16; 39/80 demonstrating significant improvement: new goal 50/80; 02/26/17 43/80    Status Revised               Plan - 04/07/17 1546    Clinical Impression Statement Patient demosntrated decreased pain in both hips and improved strength in hip musculature as  demonstrated with exercises and able to tolerate increased manual resistance both hips during treatment. She continues with weakness that needs to be addressed in order to achieve goals and be able to transition to independent home program. .    Rehab Potential Good   Clinical Impairments Affecting Rehab Potential (+) age, motivated, acute condition of recent left hip surgery  (-) hx of hip displasia, multiple surgeries both hips   PT Frequency 2x / week   PT Duration 8 weeks   PT Treatment/Interventions Gait training;Stair training;Therapeutic activities;Therapeutic exercise;Balance training;Neuromuscular re-education;Patient/family education;Manual techniques;Passive range of motion;Electrical Stimulation   PT Next Visit Plan therapeutic exercises for strength, balance, endurance   PT Home Exercise Plan standing planks on wall, progress to counter as tolerated, continue with supine, side lying strengthening exercises      Patient will benefit from skilled therapeutic intervention in order to improve the following deficits and impairments:  Abnormal gait, Decreased strength, Difficulty walking, Impaired flexibility, Impaired sensation, Pain, Increased muscle spasms, Impaired perceived functional ability  Visit Diagnosis: Muscle weakness (generalized)  Difficulty in walking, not elsewhere classified     Problem List Patient Active Problem List   Diagnosis Date Noted  . Discharge from left nipple 02/12/2017  . Pain of left breast 02/12/2017  . Iron deficiency anemia 09/17/2016  . Status post total replacement of left hip 07/11/2016  . Chronic hypotension 07/02/2016  . Collapse of lung 07/02/2016  . H/O hepatitis 07/02/2016  . History of positive PPD 07/02/2016  . History of seizures as a child 07/02/2016  . Hypokalemia 07/02/2016  . Acquired dysplasia of left hip 04/19/2016  . Acquired dysplasia of right hip 04/19/2016  . 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 Hypothyroidism 07/17/2015  . Disorder of pelvis 07/17/2015  . OP (osteoporosis) 07/17/2015  . Lumbar radicular pain 08/04/2013  . Lumbar scoliosis 08/04/2013    BJomarie LongsPT 04/08/2017, 11:59 AM  [facility] [address] [place] [address]Phone: [phone]  Fax:  [phone] Name: SJennae HakeemMRN: [id number] of Birth: 1January 29, 1987

## 2017-04-09 ENCOUNTER — Encounter: Payer: Self-pay | Admitting: Physical Therapy

## 2017-04-09 ENCOUNTER — Ambulatory Visit: Payer: Managed Care, Other (non HMO) | Admitting: Physical Therapy

## 2017-04-09 DIAGNOSIS — R262 Difficulty in walking, not elsewhere classified: Secondary | ICD-10-CM

## 2017-04-09 DIAGNOSIS — M6281 Muscle weakness (generalized): Secondary | ICD-10-CM

## 2017-04-10 NOTE — Therapy (Signed)
[place] [facility] [address] [place], [address] Phone: [phone]   Fax:  [phone]  Physical Therapy Treatment  Patient Details  Name: Christine Herring MRN: [id number] Date of Birth: [birth year] Referring Provider: Dr. Bobby Rumpf  Encounter Date: 04/09/2017      PT End of Session - 04/09/17 1641    Visit Number 52   Number of Visits 57   Date for PT Re-Evaluation 04/09/17   PT Start Time 4665   PT Stop Time 1555   PT Time Calculation (min) 44 min   Activity Tolerance Patient tolerated treatment well;Patient limited by fatigue;Patient limited by pain   Behavior During Therapy [facility] for tasks assessed/performed      Past Medical History:  Diagnosis Date  . Anxiety   . Hip dysplasia   . Osteoporosis   . Seizures ([place])    when she was a child reason not know  . Thyroid disease     Past Surgical History:  Procedure Laterality Date  . TOTAL HIP ARTHROPLASTY Right   . TUBAL LIGATION      There were no vitals filed for this visit.      Subjective Assessment - 04/09/17 1515    Subjective Patient reports she is having increased pain in right hip and left hip is doing well. Prolonged standing increases hip pain, discomfort. She had CT scan yesterday to determine if she is to have right hip surgery soon.    Limitations Sitting;Standing;Walking   Patient Stated Goals to get back to normal in order to be able to perform daily tasks difficulty   Currently in Pain? Other (Comment)  right hip hurting, left hip is not hurting      Objective: Gait: Antalgic gait pattern Palpation: posterior aspect of right hip and lateral hip: mild point tenderness gluteus medius and piriformis regions   Treatment; Therapeutic exercise: patient performed with verbal, tactile cues, assistance of therapist Supine lying: Patient performed with assistance:10 reps 5 second holds isometric hip flexion, abduction, extension each  LE  NuStep x 15 min. Level #4 workload (independent) without increased pain in hips reported at end of session  Patient response to treatment: Patient reported decreased pain in right/left hips to 0/10 following isometric exercises and STM and was able to ambulate without significant deviations at end of session. She verbalized good understanding of home program.        PT Education - 04/09/17 1540    Education provided Yes   Education Details re educated in appropriate exercises to decrease pain and improve abiltiy to walk with less pain/difficulty   Person(s) Educated Patient   Methods Explanation;Demonstration;Verbal cues   Comprehension Verbalized understanding;Returned demonstration;Verbal cues required             PT Long Term Goals - 02/26/17 1700      PT LONG TERM GOAL #1   Title Pt will improve 5x STS to <10 seconds for improved functional mobility by 02/24/2017   Baseline 17.98 sec; current 10/28/16 = 14.4 seconds; 12/30/16 13 seconds; 02/26/2017 11 seconds   Status Partially Met     PT LONG TERM GOAL #2   Title Pt will improve TUG to <10 seconds for improved functional mobility by 04/09/2017   Baseline 27.01 sec with crutches; one crutch 20 seconds 12/30/2016; 02/26/17 15 seconds   Status Revised     PT LONG TERM GOAL #3   Title Pt will score >1.0 m/s with LRAD on 10 MW test  for improved community mobility  by 4/18/ 2018   Baseline 0.42 m/s with crutches; current 13 seconds (.22ms: significant improvement: requires additional intervention); 12/30/16 12.3 seconds (,8100m: improving: still using one crutch); 12 seconds no AD   Status Revised     PT LONG TERM GOAL #4   Title Pt will improve LEFS score to 50/80 by 04/09/2017 demonstrating improved functional mobility    Baseline 14/80; current 25/80 (significant change: goal achieved and goal revised); 12/30/16; 39/80 demonstrating significant improvement: new goal 50/80; 02/26/17 43/80    Status Revised                Plan - 04/09/17 1642    Clinical Impression Statement Patient improved hip pain to 0/10 with exercise. She is progressing well with isometric exercises to hips with improved ambulation. She continues with weakness in both LE's and will benefit from continued physical therapy intervention to achieve maximal function as she awaits possible surgery on right hip.    Rehab Potential Good   Clinical Impairments Affecting Rehab Potential (+) age, motivated, acute condition of recent left hip surgery  (-) hx of hip displasia, multiple surgeries both hips   PT Frequency 2x / week   PT Duration 8 weeks   PT Treatment/Interventions Gait training;Stair training;Therapeutic activities;Therapeutic exercise;Balance training;Neuromuscular re-education;Patient/family education;Manual techniques;Passive range of motion;Electrical Stimulation   PT Next Visit Plan therapeutic exercises for strength, balance, endurance   PT Home Exercise Plan standing planks on wall, progress to counter as tolerated, continue with supine, side lying strengthening exercises      Patient will benefit from skilled therapeutic intervention in order to improve the following deficits and impairments:  Abnormal gait, Decreased strength, Difficulty walking, Impaired flexibility, Impaired sensation, Pain, Increased muscle spasms, Impaired perceived functional ability  Visit Diagnosis: Muscle weakness (generalized)  Difficulty in walking, not elsewhere classified     Problem List Patient Active Problem List   Diagnosis Date Noted  . Discharge from left nipple 02/12/2017  . Pain of left breast 02/12/2017  . Iron deficiency anemia 09/17/2016  . Status post total replacement of left hip 07/11/2016  . Chronic hypotension 07/02/2016  . Collapse of lung 07/02/2016  . H/O hepatitis 07/02/2016  . History of positive PPD 07/02/2016  . History of seizures as a child 07/02/2016  . Hypokalemia 07/02/2016  . Acquired dysplasia of left hip  04/19/2016  . Acquired dysplasia of right hip 04/19/2016  . 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 Hypothyroidism 07/17/2015  . Disorder of pelvis 07/17/2015  . OP (osteoporosis) 07/17/2015  . Lumbar radicular pain 08/04/2013  . Lumbar scoliosis 08/04/2013    BrJomarie LongsT 04/10/2017, 6:44 PM  [facility] [address]hone: [phone] Fax:  [phone]: Christine MuroRN: [id number] of Birth: 1/Feb 06, 1986

## 2017-04-14 ENCOUNTER — Ambulatory Visit: Payer: Managed Care, Other (non HMO) | Admitting: Physical Therapy

## 2017-04-15 ENCOUNTER — Encounter: Payer: Self-pay | Admitting: Physical Therapy

## 2017-04-15 ENCOUNTER — Ambulatory Visit: Payer: Managed Care, Other (non HMO) | Admitting: Physical Therapy

## 2017-04-15 DIAGNOSIS — M6281 Muscle weakness (generalized): Secondary | ICD-10-CM | POA: Diagnosis not present

## 2017-04-15 DIAGNOSIS — M25551 Pain in right hip: Secondary | ICD-10-CM

## 2017-04-15 DIAGNOSIS — R262 Difficulty in walking, not elsewhere classified: Secondary | ICD-10-CM

## 2017-04-16 ENCOUNTER — Ambulatory Visit: Payer: Managed Care, Other (non HMO) | Admitting: Physical Therapy

## 2017-04-16 NOTE — Therapy (Signed)
[facility] [address] [place], [address] Phone: [phone]   Fax:  [phone]  Physical Therapy Treatment  Patient Details  Name: Lavaughn Bisig MRN: [id number] Date of Birth: Mar 03, 1986 Referring Provider: Dr. Melvyn Neth  Encounter Date: 04/15/2017      PT End of Session - 04/15/17 1513    Visit Number 53   Number of Visits 72   Date for PT Re-Evaluation 05/27/17   PT Start Time 1510   PT Stop Time 1550   PT Time Calculation (min) 40 min   Activity Tolerance Patient tolerated treatment well;Patient limited by fatigue;Patient limited by pain   Behavior During Therapy [facility] for tasks assessed/performed      Past Medical History:  Diagnosis Date  . Anxiety   . Hip dysplasia   . Osteoporosis   . Seizures ([facility])    when she was a child reason not know  . Thyroid disease     Past Surgical History:  Procedure Laterality Date  . TOTAL HIP ARTHROPLASTY Right   . TUBAL LIGATION      There were no vitals filed for this visit.      Subjective Assessment - 04/15/17 1514    Subjective Patient reports she had significant increased pain in right hip last evening with thunderstorms coming into the area. She reports pain was for no apparent reason.    Limitations Sitting;Standing;Walking   Patient Stated Goals to get back to normal in order to be able to perform daily tasks difficulty   Currently in Pain? Other (Comment)  right hip sore, left hip no pain today        Objective: Gait: Antalgic gait pattern Palpation: posterior aspect of right hip and lateral hip: mild point tenderness gluteus medius and piriformis regions Strength;  Right hip abduction 2/5, ER 3/5, flexion 3+/5, Extension 3-/5 Left hip abduction 3+/5, ER 3+/5, flexion 4-/5, extension 3/5 Outcome measures; TUG 16 seconds LEFS 40/80  (80 = no self perceived disability) 5x sit to stand 12 seconds   Treatment; Therapeutic exercise:  patient performed with verbal, tactile cues, assistance of therapist Supine lying: Patient performed with assistance:10 reps 5 second holds isometric hip flexion, abduction, extension each LE  OMEGA Standing pull downs 15# x 15 reps Standing palloff press facing forward 15# x 15 reps Scapular rows seated 15# x 15 reps  NuStep x 15 min. Level #4 workload (independent) with moist heat applied to lower back during exercise: no adverse reactions noted  Patient response to treatment: Patient reported decreased pain in right hip and improved technique and motor control with repetition. She required minimal VC for proper alignment to perform exercises.          PT Education - 04/15/17 1519    Education provided Yes   Education Details discussed exercises and pressure gradients that may contribute to increased hip pain on certain days   Person(s) Educated Patient   Methods Explanation   Comprehension Verbalized understanding             PT Long Term Goals - 04/15/17 1600      PT LONG TERM GOAL #1   Title Pt will improve 5x STS to <10 seconds for improved functional mobility by 05/27/2017   Baseline 17.98 sec; current 10/28/16 = 14.4 seconds; 12/30/16 13 seconds; 02/26/2017 11 seconds; 04/15/17 12 seconds   Status Revised     PT LONG TERM GOAL #2   Title Pt will improve TUG  to <10 seconds for improved functional mobility by 05/27/2017   Baseline 27.01 sec with crutches; one crutch 20 seconds 12/30/2016; 02/26/17 15 seconds; 04/15/17 16 seconds   Status Revised     PT LONG TERM GOAL #3   Title Pt will score >1.0 m/s with LRAD on 10 MW test for improved community mobility  by 6/5/ 2018   Baseline 0.42 m/s with crutches; 04/15/2017 deferred    Status Revised     PT LONG TERM GOAL #4   Title Pt will improve LEFS score to 50/80 by 05/27/2017 demonstrating improved functional mobility    Baseline 14/80; current 25/80; new goal 50/80; 02/26/17 43/80; 04/15/17 40/80    Status Revised                Plan - 04/15/17 1520    Clinical Impression Statement Patient is progressing slowly and steadily with strength left LE and improving function with daily tasks. She continues with pain in right hip with weakness that limits ability to walk without difficulty. She will benefit from continued physical therapy interveniton to achieve maximal function with daily tasks     Rehab Potential Good   Clinical Impairments Affecting Rehab Potential (+) age, motivated, acute condition of recent left hip surgery  (-) hx of hip displasia, multiple surgeries both hips   PT Frequency 2x / week   PT Duration 8 weeks   PT Treatment/Interventions Gait training;Stair training;Therapeutic activities;Therapeutic exercise;Balance training;Neuromuscular re-education;Patient/family education;Manual techniques;Passive range of motion;Electrical Stimulation   PT Next Visit Plan therapeutic exercises for strength, balance, endurance   PT Home Exercise Plan standing planks on wall, progress to counter as tolerated, continue with supine, side lying strengthening exercises   Consulted and Agree with Plan of Care Patient      Patient will benefit from skilled therapeutic intervention in order to improve the following deficits and impairments:  Abnormal gait, Decreased strength, Difficulty walking, Impaired flexibility, Impaired sensation, Pain, Increased muscle spasms, Impaired perceived functional ability  Visit Diagnosis: Muscle weakness (generalized) - Plan: PT plan of care cert/re-cert  Pain in right hip - Plan: PT plan of care cert/re-cert  Difficulty in walking, not elsewhere classified - Plan: PT plan of care cert/re-cert     Problem List Patient Active Problem List   Diagnosis Date Noted  . Discharge from left nipple 02/12/2017  . Pain of left breast 02/12/2017  . Iron deficiency anemia 09/17/2016  . Status post total replacement of left hip 07/11/2016  . Chronic hypotension 07/02/2016   . Collapse of lung 07/02/2016  . H/O hepatitis 07/02/2016  . History of positive PPD 07/02/2016  . History of seizures as a child 07/02/2016  . Hypokalemia 07/02/2016  . Acquired dysplasia of left hip 04/19/2016  . Acquired dysplasia of right hip 04/19/2016  . 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 Hypothyroidism 07/17/2015  . Disorder of pelvis 07/17/2015  . OP (osteoporosis) 07/17/2015  . Lumbar radicular pain 08/04/2013  . Lumbar scoliosis 08/04/2013    Beacher May PT 04/16/2017, 10:31 PM  [facility] [address] [place], [address] Phone: [phone]   Fax:  [phone]  Name: Marilou Barnfield MRN: [id number] Date of Birth: June 13, 1986

## 2017-04-17 ENCOUNTER — Encounter: Payer: Self-pay | Admitting: Physical Therapy

## 2017-04-17 ENCOUNTER — Ambulatory Visit: Payer: Managed Care, Other (non HMO) | Admitting: Physical Therapy

## 2017-04-17 DIAGNOSIS — M6281 Muscle weakness (generalized): Secondary | ICD-10-CM

## 2017-04-17 DIAGNOSIS — R262 Difficulty in walking, not elsewhere classified: Secondary | ICD-10-CM

## 2017-04-17 DIAGNOSIS — M25551 Pain in right hip: Secondary | ICD-10-CM

## 2017-04-17 NOTE — Therapy (Signed)
[facility] PH[facility]ORTS MEDICINE [address], [place], [address] Phone: [phone]   Fax:  [phone]  Physical Therapy Treatment  Patient Details  Name: Christine Herring MRN: [id number] Date of Birth: [birth year] Referring Provider: Dr. Melvyn Neth  Encounter Date: 04/17/2017      PT End of Session - 04/17/17 1559    Visit Number 54   Number of Visits 72   Date for PT Re-Evaluation 05/27/17   PT Start Time 1511   PT Stop Time 1605   PT Time Calculation (min) 54 min   Activity Tolerance Patient tolerated treatment well   Behavior During Therapy [facility] for tasks assessed/performed      Past Medical History:  Diagnosis Date  . Anxiety   . Hip dysplasia   . Osteoporosis   . Seizures (HCC)    when she was a child reason not know  . Thyroid disease     Past Surgical History:  Procedure Laterality Date  . TOTAL HIP ARTHROPLASTY Right   . TUBAL LIGATION      There were no vitals filed for this visit.      Subjective Assessment - 04/17/17 1514    Subjective Patient reports she is having increased right knee pain today and her left leg quadriceps muscle feels sore today. Patient reports that prolonged sitting in certain types of chairs in school contributes to her hip pain and burning.    Limitations Sitting;Standing;Walking   Patient Stated Goals to get back to normal in order to be able to perform daily tasks difficulty   Currently in Pain? Other (Comment)  right hip hurts more at night, left LE sore       Objective: Gait: antalgic with right femur IR during entire cycle Strength; left hip flexion, abduction, extension decreased grossly 4-/5     Treatment; Therapeutic exercise: patient performed with verbal, tactile cues, assistance of therapist; goal independent home program, strength, decrease pain Supine lying: Patient performed with assistance:10 reps 5 second holds isometric hip flexion, abduction, extension each  LE 45cm ball under lower legs: patient performed hip/knee flexion and lower trunk rotation x 10 reps each with controlled motion  OMEGA Standing pull downs 15# x 15 reps Standing palloff press facing forward 15# x 15 reps Scapular rows seated 15# x 15 reps Seated reverse chin ups with 25# x 15 reps  NuStep x 15 min. Level #4 workload (independent): unbilled time  Patient response to treatment: Patient reported decreased pain in right hip and improved technique and motor control with repetition. She required minimal VC for proper alignment to perform exercises.           PT Education - 04/17/17 1540    Education provided Yes   Education Details continue with exercises as instructed; add large ball for hip/knee flexion and LTR   Person(s) Educated Patient   Methods Explanation   Comprehension Verbalized understanding             PT Long Term Goals - 04/15/17 1600      PT LONG TERM GOAL #1   Title Pt will improve 5x STS to <10 seconds for improved functional mobility by 05/27/2017   Baseline 17.98 sec; current 10/28/16 = 14.4 seconds; 12/30/16 13 seconds; 02/26/2017 11 seconds; 04/15/17 12 seconds   Status Revised     PT LONG TERM GOAL #2   Title Pt will improve TUG to <10 seconds for improved functional mobility by 05/27/2017   Baseline 27.01  sec with crutches; one crutch 20 seconds 12/30/2016; 02/26/17 15 seconds; 04/15/17 16 seconds   Status Revised     PT LONG TERM GOAL #3   Title Pt will score >1.0 m/s with LRAD on 10 MW test for improved community mobility  by 6/5/ 2018   Baseline 0.42 m/s with crutches; 04/15/2017 deferred    Status Revised     PT LONG TERM GOAL #4   Title Pt will improve LEFS score to 50/80 by 05/27/2017 demonstrating improved functional mobility    Baseline 14/80; current 25/80; new goal 50/80; 02/26/17 43/80; 04/15/17 40/80    Status Revised               Plan - 04/17/17 1610    Clinical Impression Statement Patinet is progressing slowly and  steadily with strength left LE and improving funciton with daily tasks. She continues with pain in right hip/LE and weakness bilateral LE's which limits function with walking and daily tasks. she will benefit from continued physical therapy intervention to achieve maximal function.    Rehab Potential Good   Clinical Impairments Affecting Rehab Potential (+) age, motivated, acute condition of recent left hip surgery  (-) hx of hip displasia, multiple surgeries both hips   PT Frequency 2x / week   PT Duration 8 weeks   PT Treatment/Interventions Gait training;Stair training;Therapeutic activities;Therapeutic exercise;Balance training;Neuromuscular re-education;Patient/family education;Manual techniques;Passive range of motion;Electrical Stimulation   PT Next Visit Plan therapeutic exercises for strength, balance, endurance   PT Home Exercise Plan standing planks on wall, progress to counter as tolerated, continue with supine, side lying strengthening exercises      Patient will benefit from skilled therapeutic intervention in order to improve the following deficits and impairments:  Abnormal gait, Decreased strength, Difficulty walking, Impaired flexibility, Impaired sensation, Pain, Increased muscle spasms, Impaired perceived functional ability  Visit Diagnosis: Muscle weakness (generalized)  Pain in right hip  Difficulty in walking, not elsewhere classified     Problem List Patient Active Problem List   Diagnosis Date Noted  . Discharge from left nipple 02/12/2017  . Pain of left breast 02/12/2017  . Iron deficiency anemia 09/17/2016  . Status post total replacement of left hip 07/11/2016  . Chronic hypotension 07/02/2016  . Collapse of lung 07/02/2016  . H/O hepatitis 07/02/2016  . History of positive PPD 07/02/2016  . History of seizures as a child 07/02/2016  . Hypokalemia 07/02/2016  . Acquired dysplasia of left hip 04/19/2016  . Acquired dysplasia of right hip 04/19/2016  .  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 Hypothyroidism 07/17/2015  . Disorder of pelvis 07/17/2015  . OP (osteoporosis) 07/17/2015  . Lumbar radicular pain 08/04/2013  . Lumbar scoliosis 08/04/2013    Beacher May PT 04/18/2017, 7:19 PM   [facility] PHYSICAL AND SPORTS MEDICINE [address] [place], [address] Phone: [phone]   Fax:  [phone]  Name: Jin Shockley MRN: [id number] Date of Birth: 10-20-1986

## 2017-04-21 ENCOUNTER — Ambulatory Visit: Payer: Managed Care, Other (non HMO) | Admitting: Physical Therapy

## 2017-04-22 ENCOUNTER — Ambulatory Visit: Payer: Managed Care, Other (non HMO) | Admitting: Physical Therapy

## 2017-04-24 ENCOUNTER — Ambulatory Visit: Payer: Managed Care, Other (non HMO) | Admitting: Physical Therapy

## 2017-04-28 ENCOUNTER — Ambulatory Visit: Payer: Managed Care, Other (non HMO) | Admitting: Physical Therapy

## 2017-05-01 ENCOUNTER — Encounter: Payer: Self-pay | Admitting: Nurse Practitioner

## 2017-05-01 ENCOUNTER — Ambulatory Visit
Admission: RE | Admit: 2017-05-01 | Discharge: 2017-05-01 | Disposition: A | Discharge status: Home / Self Care | Payer: Managed Care, Other (non HMO) | Source: Ambulatory Visit | Attending: Nurse Practitioner | Admitting: Nurse Practitioner

## 2017-05-01 ENCOUNTER — Ambulatory Visit (INDEPENDENT_AMBULATORY_CARE_PROVIDER_SITE_OTHER): Payer: Managed Care, Other (non HMO) | Admitting: Nurse Practitioner

## 2017-05-01 VITALS — BP 95/61 | HR 60 | Temp 98.4°F | Resp 16 | Wt 164.4 lb

## 2017-05-01 DIAGNOSIS — R05 Cough: Secondary | ICD-10-CM | POA: Diagnosis not present

## 2017-05-01 DIAGNOSIS — R053 Chronic cough: Secondary | ICD-10-CM

## 2017-05-01 MED ORDER — ALBUTEROL SULFATE HFA 108 (90 BASE) MCG/ACT IN AERS: 2.0000 | INHALATION_SPRAY | Freq: Four times a day (QID) | RESPIRATORY_TRACT | 2 refills | Status: DC | PRN

## 2017-05-01 MED ORDER — BENZONATATE 100 MG PO CAPS: ORAL_CAPSULE | ORAL | 0 refills | Status: DC

## 2017-05-01 NOTE — Patient Instructions (Addendum)
Laural, Thank you for coming in to clinic today.  1. For your cough, - Chest Xray today - Take benzonatate 100 mg 1-2 pills every 12 hours for cough. - start albuterol inhaler 1-2 puffs every 6 hours as needed for cough/wheezing.  Please schedule a follow-up appointment with Wilhelmina McardleLauren Reynalda Herring, AGNP to Return 1-2 weeks if symptoms worsen or fail to improve.  If you have any other questions or concerns, please feel free to call the clinic or send a message through MyChart. You may also schedule an earlier appointment if necessary.  Wilhelmina McardleLauren Jaline Pincock, DNP, AGNP-BC Adult Gerontology Nurse Practitioner [facility]   Bronchospasm, Adult Bronchospasm is a tightening of the airways going into the lungs. During an episode, it may be harder to breathe. You may cough, and you may make a whistling sound when you breathe (wheeze). This condition often affects people with asthma. What are the causes? This condition is caused by swelling and irritation in the airways. It can be triggered by:  An infection (common).  Seasonal allergies.  An allergic reaction.  Exercise.  Irritants. These include pollution, cigarette smoke, strong odors, aerosol sprays, and paint fumes.  Weather changes. Winds increase molds and pollens in the air. Cold air may cause swelling.  Stress and emotional upset. What are the signs or symptoms? Symptoms of this condition include:  Wheezing. If the episode was triggered by an allergy, wheezing may start right away or hours later.  Nighttime coughing.  Frequent or severe coughing with a simple cold.  Chest tightness.  Shortness of breath.  Decreased ability to exercise. How is this diagnosed? This condition is usually diagnosed with a review of your medical history and a physical exam. Tests, such as lung function tests, are sometimes done to look for other conditions. The need for a chest X-ray depends on where the wheezing occurs and  whether it is the first time you have wheezed. How is this treated? This condition may be treated with:  Inhaled medicines. These open up the airways and help you breathe. They can be taken with an inhaler or a nebulizer device.  Corticosteroid medicines. These may be given for severe bronchospasm, usually when it is associated with asthma.  Avoiding triggers, such as irritants, infection, or allergies. Follow these instructions at home: Medicines   Take over-the-counter and prescription medicines only as told by your health care provider.  If you need to use an inhaler or nebulizer to take your medicine, ask your health care provider to explain how to use it correctly. If you were given a spacer, always use it with your inhaler. Lifestyle   Reduce the number of triggers in your home. To do this:  Change your heating and air conditioning filter at least once a month.  Limit your use of fireplaces and wood stoves.  Do not smoke. Do not allow smoking in your home.  Avoid using perfumes and fragrances.  Get rid of pests, such as roaches and mice, and their droppings.  Remove any mold from your home.  Keep your house clean and dust free. Use unscented cleaning products.  Replace carpet with wood, tile, or vinyl flooring. Carpet can trap dander and dust.  Use allergy-proof pillows, mattress covers, and box spring covers.  Wash bed sheets and blankets every week in hot water. Dry them in a dryer.  Use blankets that are made of polyester or cotton.  Wash your hands often.  Do not allow pets in your bedroom.  Avoid breathing in cold air when you exercise. General instructions   Have a plan for seeking medical care. Know when to call your health care provider and local emergency services, and where to get emergency care.  Stay up to date on your immunizations.  When you have an episode of bronchospasm, stay calm. Try to relax and breathe more slowly.  If you have asthma,  make sure you have an asthma action plan.  Keep all follow-up visits as told by your health care provider. This is important. Contact a health care provider if:  You have muscle aches.  You have chest pain.  The mucus that you cough up (sputum) changes from clear or white to yellow, green, gray, or bloody.  You have a fever.  Your sputum gets thicker. Get help right away if:  Your wheezing and coughing get worse, even after you take your prescribed medicines.  It gets even harder to breathe.  You develop severe chest pain. Summary  Bronchospasm is a tightening of the airways going into the lungs.  During an episode of bronchospasm, you may have a harder time breathing. You may cough and make a whistling sound when you breathe (wheeze).  Avoid exposure to triggers such as smoke, dust, mold, animal dander, and fragrances.  When you have an episode of bronchospasm, stay calm. Try to relax and breathe more slowly. This information is not intended to replace advice given to you by your health care provider. Make sure you discuss any questions you have with your health care provider. Document Released: 12/12/2003 Document Revised: 12/05/2016 Document Reviewed: 12/05/2016 Elsevier Interactive Patient Education  2017 [employer].

## 2017-05-01 NOTE — Progress Notes (Signed)
Subjective:    Patient ID: Christine Herring, female    DOB: 14-Jun-1986, 31 y.o.   MRN: [id number]  Christine Herring is a 31 y.o. female presenting on 05/01/2017 for Cough (Patient here today C/O cough and chest congestion x's several months. Patient reports that her symptoms are worse mostly in the morings. Patient reports she tried taking OTC Claritine reports no improvment.)   HPI  Cough Bad cold December and came in to clinic.  Dr. Kirtland Bouchard prescribed antibiotic and she improved, but has had a lingering nagging cough since then.  Worst in am and sometimes happens through the afternoon.  She states she does not feel sick.  She has tried Claritin x 1 week, but stopped r/t having new headaches and persistent cough.  She also tried HT allergy and sinus and stopped after having headache first day.  She has also taken halls, teas , lemon w/ honey.  She has not taken any cough suppressants and has had no improvement over time.    Nagging cough described as dry most of the time.  Sometimes she feels pleghm w/o productivity.  Cough has caused chest pressure r/t muscle tension.  With deep breathing she feels wheezing.  Saturday she had the most wheezing since onset and this occurrs about every 2 weeks.    Unknown if allergy exists or if exposure to something at work.  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  She has worked there 1 year & 3 months (Started Feb 2017).  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  She did not have symptoms of cold or cough until December.  Pt has not traveled outside country.  No Tb exposures.  However, tests positive for ppd and received treatment once.  Last CXR was over 2 years ago and was normal at that time.  Former Smoking Quit smoking 2009-2010 and had smoked 1 pack  q2 weeks for about 8 years.  In Jan-Feb 2018, she restarted for 2 weeks for stressful life events.  Smoking didn't help, so she quit again and has no desire to restart.   Social History  Substance Use Topics  . Smoking status: Former Smoker    Packs/day: 0.50    Years: 10.00    Quit date: 07/16/2005  . Smokeless tobacco: Never Used  . Alcohol use Yes     Comment: social    Review of Systems Per HPI unless specifically indicated above     Objective:    BP 95/61 (BP Location: Right Arm, Patient Position: Sitting, Cuff Size: Large)   Pulse 60   Temp 98.4 F (36.9 C) (Oral)   Resp 16   Wt 164 lb 6.4 oz (74.6 kg)   SpO2 100%   BMI 27.36 kg/m    Wt Readings from Last 3 Encounters:  05/01/17 164 lb 6.4 oz (74.6 kg)  02/11/17 166 lb 1.6 oz (75.3 kg)  12/04/16 165 lb (74.8 kg)    Physical Exam  Constitutional: She is oriented to person, place, and time. She appears well-developed and well-nourished. No distress.  HENT:  Head: Normocephalic and atraumatic.  Eyes: Conjunctivae and EOM are normal. Pupils are equal, round, and reactive to light.  Neck: Normal range of motion. Neck supple. No JVD present. No tracheal deviation present.  Cardiovascular: Normal  rate, regular rhythm, normal heart sounds and intact distal pulses.   Pulmonary/Chest: Effort normal and breath sounds normal. No respiratory distress. She has no wheezes. She has no rales.  Cough elicited with deep breathing.  Small, short coughs throughout visit  Abdominal: Soft. Bowel sounds are normal.  Lymphadenopathy:    She has no cervical adenopathy.  Neurological: She is alert and oriented to person, place, and time.  Skin: Skin is warm and dry.  Psychiatric: She has a normal mood and affect. Her behavior is normal. Judgment and thought content normal.  Vitals reviewed.    DG Chest 2 View CLINICAL DATA:  Cough and chest pain  EXAM: CHEST  2 VIEW  COMPARISON:  Chest CT 04/02/2011  FINDINGS: The heart size  and mediastinal contours are within normal limits. Both lungs are clear. The visualized skeletal structures are unremarkable.  IMPRESSION: No active cardiopulmonary disease.  Electronically Signed   By: Deatra RobinsonKevin  Herman M.D.   On: 05/01/2017 15:54      Assessment & Plan:   Problem List Items Addressed This Visit    None    Visit Diagnoses    Cough, persistent    -  Primary Subacute cough.  Possible bronchitis this winter, however no change in severity or course of cough since January.  Cough consistent with reactive airway.  Plan: 1. Proceed with benzonatate capsules for symptom relief.   - May also take Mucinex DM. - Continue supportive measures patient has already tried. 2. Take albuterol 1-2 puffs every 6 hours as needed for wheezing. 3. Chest X-ray to evaluate infectious cause/ hyperinflation 4. Consider environmental exposures as possible cause of cough.  Evaluate spirometry or consider Pulmonary referral if no improvement.   Relevant Medications   benzonatate (TESSALON) 100 MG capsule   albuterol (PROVENTIL HFA;VENTOLIN HFA) 108 (90 Base) MCG/ACT inhaler   Other Relevant Orders   DG Chest 2 View (Completed)      Meds ordered this encounter  Medications  . benzonatate (TESSALON) 100 MG capsule    Sig: Take 1-2 tablets twice per day as needed for cough.    Dispense:  20 capsule    Refill:  0    Order Specific Question:   Supervising Provider    Answer:   Smitty CordsKARAMALEGOS, ALEXANDER J [[id number]]  . albuterol (PROVENTIL HFA;VENTOLIN HFA) 108 (90 Base) MCG/ACT inhaler    Sig: Inhale 2 puffs into the lungs every 6 (six) hours as needed for wheezing or shortness of breath.    Dispense:  1 Inhaler    Refill:  2    Order Specific Question:   Supervising Provider    Answer:   Smitty CordsKARAMALEGOS, ALEXANDER J [[id number]]      Follow up plan: Return 1-2 weeks if symptoms worsen or fail to improve.    Wilhelmina McardleLauren Kaitlynd Phillips, DNP, AGPCNP-BC Adult Gerontology Primary Care Nurse Practitioner [facility] 05/01/2017, 1:47 PM

## 2017-05-02 NOTE — Progress Notes (Signed)
I have reviewed this encounter including the documentation in this note and/or discussed this patient with the provider, Wilhelmina McardleLauren Kennedy, AGPCNP-BC. I am certifying that I agree with the content of this note as supervising physician.  Saralyn PilarAlexander Karamalegos, DO [facility] 05/02/2017, 11:15 PM

## 2017-05-06 ENCOUNTER — Ambulatory Visit: Payer: Managed Care, Other (non HMO) | Attending: Orthopedic Surgery | Admitting: Physical Therapy

## 2017-05-13 ENCOUNTER — Ambulatory Visit: Payer: Managed Care, Other (non HMO) | Admitting: Physical Therapy

## 2017-05-20 ENCOUNTER — Ambulatory Visit: Payer: Managed Care, Other (non HMO) | Admitting: Physical Therapy

## 2017-05-27 ENCOUNTER — Ambulatory Visit: Payer: Managed Care, Other (non HMO) | Admitting: Physical Therapy

## 2017-06-02 ENCOUNTER — Encounter: Payer: Managed Care, Other (non HMO) | Admitting: Physical Therapy

## 2017-06-04 ENCOUNTER — Encounter: Payer: Managed Care, Other (non HMO) | Admitting: Physical Therapy

## 2017-06-10 ENCOUNTER — Encounter: Payer: Managed Care, Other (non HMO) | Admitting: Physical Therapy

## 2017-06-12 ENCOUNTER — Encounter: Payer: Managed Care, Other (non HMO) | Admitting: Physical Therapy

## 2017-06-20 DIAGNOSIS — M1631 Unilateral osteoarthritis resulting from hip dysplasia, right hip: Secondary | ICD-10-CM | POA: Insufficient documentation

## 2017-07-07 DIAGNOSIS — Z96641 Presence of right artificial hip joint: Secondary | ICD-10-CM | POA: Insufficient documentation

## 2017-07-30 ENCOUNTER — Ambulatory Visit: Payer: Managed Care, Other (non HMO) | Attending: Orthopedic Surgery | Admitting: Physical Therapy

## 2017-07-30 ENCOUNTER — Encounter: Payer: Self-pay | Admitting: Physical Therapy

## 2017-07-30 DIAGNOSIS — M25551 Pain in right hip: Secondary | ICD-10-CM | POA: Diagnosis present

## 2017-07-30 DIAGNOSIS — M6281 Muscle weakness (generalized): Secondary | ICD-10-CM | POA: Diagnosis not present

## 2017-07-30 DIAGNOSIS — R262 Difficulty in walking, not elsewhere classified: Secondary | ICD-10-CM

## 2017-07-31 NOTE — Therapy (Signed)
[facility]ICINE [address], [place], [address] Phone: [phone]   Fax:  [phone]  Physical Therapy Evaluation  Patient Details  Name: Christine Herring MRN: [id number] Date of Birth: 29-Dec-1985 Referring Provider: Park Meo MD  Encounter Date: 07/30/2017      PT End of Session - 07/30/17 1310    Visit Number 1   Number of Visits 16   Date for PT Re-Evaluation 09/24/17   PT Start Time 1118   PT Stop Time 1210   PT Time Calculation (min) 52 min   Activity Tolerance Patient tolerated treatment well   Behavior During Therapy [facility] for tasks assessed/performed      Past Medical History:  Diagnosis Date  . Anxiety   . Hip dysplasia   . Osteoporosis   . Seizures (HCC)    when she was a child reason not know  . Thyroid disease     Past Surgical History:  Procedure Laterality Date  . TOTAL HIP ARTHROPLASTY Right   . TUBAL LIGATION      There were no vitals filed for this visit.       Subjective Assessment - 07/30/17 1130    Subjective Patient is s/p right THR 07/07/2017 with abductor repair and reports her right LE feels "like jelly" when walking. She is not having any pain and is wearing a brace on her right LE and is limited in performing hip abduction due to repair.    Pertinent History hx of B hip displasia, B hip labral reconstruction, osteroporosis, leg length discrepancy,  L THR 2017 and most recently right THR with abductor repair 07/07/2017   Limitations Sitting;Standing;Walking   Patient Stated Goals to get back to normal in order to be able to perform daily tasks difficulty   Currently in Pain? No/denies            [facility] PT Assessment - 07/30/17 1300      Assessment   Medical Diagnosis Enthesopathy of right hip region; right THR with hip abductor repair    Referring Provider Park Meo MD   Onset Date/Surgical Date 07/07/17   Hand Dominance Right   Next MD Visit --   Prior  Therapy PT in the hospital and HHPT     Precautions   Precautions Anterior Hip;Other (comment)  hip abductor repair     Restrictions   Weight Bearing Restrictions Yes   LLE Weight Bearing Partial weight bearing   LLE Partial Weight Bearing Percentage or Pounds 30%     Balance Screen   Has the patient fallen in the past 6 months No     Home Environment   Living Environment Private residence   Living Arrangements Children;Other relatives   Available Help at Discharge --   Type of Home House   Home Access Ramped entrance   Home Layout One level   Home Equipment Walker - 2 wheels;Crutches;Cane - single point;Toilet riser;Bedside commode     Prior Function   Level of Independence Independent   Vocation Full time employment;Student   Vocation Requirements desk job, walking to/from different parts of office   Leisure travel, dance, swim, spend time with family     Cognition   Overall Cognitive Status Within Functional Limits for tasks assessed     Observation/Other Assessments   Lower Extremity Functional Scale  9/80     ROM / Strength   AROM / PROM / Strength AROM;Strength     AROM  Overall AROM Comments limited due to precautions right LE s/p surgery: left LE hip knee and ankle WNL; right knee and ankle WNL     Strength   Overall Strength Within functional limits for tasks performed  left LE only: right LE not tested due to recent surger   Overall Strength Comments unable to test right LE hip, knee, ankle due to precautions of surgery     Ambulation/Gait   Assistive device Crutches   Gait Pattern Step-to pattern;Abducted- right  2 point gait pattern            Objective measurements completed on examination: See above findings.                  PT Education - 07/30/17 1300    Education provided Yes   Education Details POC, precautions, core strengthening   Person(s) Educated Patient   Methods Demonstration;Verbal cues;Handout;Explanation    Comprehension Verbalized understanding;Returned demonstration;Verbal cues required             PT Long Term Goals - 07/30/17 1310      PT LONG TERM GOAL #1   Title Pt will improve LEFS score to 30/80  demonstrating improved functional mobility    Baseline LEFS 9/80   Status New   Target Date 09/04/17     PT LONG TERM GOAL #2   Title Pt will improve LEFS score to 40/80  demonstrating improved functional mobility    Baseline LEFS 9/80   Status New   Target Date 09/24/17     PT LONG TERM GOAL #3   Title Pt will score >1.0 m/s without AD on 10 MW test for improved community mobility     Baseline ambulating with bilateral axillary crutches, slow cadence   Status New   Target Date 09/24/17     PT LONG TERM GOAL #4   Title Pt. will be independent with home program for exercises for strength, endurance, ROM weekly through discharge to allow self management once discharged from physical therapy   Baseline requires guidance, instruction and assistance for exercises   Status New   Target Date 09/24/17                Plan - 07/30/17 1230    Clinical Impression Statement Patient is a 31 year old female who presents s/p right THR and abductor repair 07/07/2017. She has limitations of ROM, strength, ambulation with precautions for hip s/p surgery. She has limited knowledge of exercises, progression and precautions and will benefit from physical therapy intervention to achieve maximal function.    History and Personal Factors relevant to plan of care: bilateral hip displasia, multiple hip surgeries over the years; most recent left THR 06/2016; right THR with abductor repair 07/07/2017   Clinical Presentation Evolving   Clinical Presentation due to: recent surgery   Clinical Decision Making Moderate   Rehab Potential Good   Clinical Impairments Affecting Rehab Potential (+) age, motivated, acute condition of recent right hip surgery  (-) hx of hip displasia, multiple surgeries both  hips   PT Frequency 2x / week   PT Duration 8 weeks   PT Treatment/Interventions Gait training;Therapeutic exercise;Balance training;Neuromuscular re-education;Patient/family education;Manual techniques;Passive range of motion;Electrical Stimulation;Moist Heat   PT Next Visit Plan therapeutic exercises for strength, balance, endurance   PT Home Exercise Plan core strengthening, gluteal setting, quad setting, ankle pumps   Consulted and Agree with Plan of Care Patient      Patient will benefit from skilled therapeutic  intervention in order to improve the following deficits and impairments:  Abnormal gait, Decreased strength, Difficulty walking, Impaired flexibility, Impaired sensation, Pain, Increased muscle spasms, Impaired perceived functional ability, Decreased activity tolerance, Decreased endurance, Decreased range of motion  Visit Diagnosis: Muscle weakness (generalized) - Plan: PT plan of care cert/re-cert  Difficulty in walking, not elsewhere classified - Plan: PT plan of care cert/re-cert     Problem List Patient Active Problem List   Diagnosis Date Noted  . Discharge from left nipple 02/12/2017  . Pain of left breast 02/12/2017  . Femoral anteversion of right lower extremity 01/03/2017  . Femoroacetabular impingement of right hip 01/03/2017  . Iron deficiency anemia 09/17/2016  . Status post total replacement of left hip 07/11/2016  . Chronic hypotension 07/02/2016  . Collapse of lung 07/02/2016  . H/O hepatitis 07/02/2016  . History of positive PPD 07/02/2016  . History of seizures as a child 07/02/2016  . Hypokalemia 07/02/2016  . Acquired dysplasia of left hip 04/19/2016  . Acquired dysplasia of right hip 04/19/2016  . 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 Hypothyroidism 07/17/2015  . Disorder of pelvis 07/17/2015  . OP (osteoporosis) 07/17/2015  . Lumbar radicular pain 08/04/2013  . Lumbar scoliosis 08/04/2013    Beacher May  PT 07/31/2017, 6:34 PM  [place] [facility] PHYSICAL AND SPORTS MEDICINE [address], [place], [address] Phone: [phone]   Fax:  [phone]  Name: Christine Herring MRN: [id number] Date of Birth: Jun 23, 1986

## 2017-08-04 ENCOUNTER — Encounter: Payer: Self-pay | Admitting: Physical Therapy

## 2017-08-04 ENCOUNTER — Ambulatory Visit: Payer: Managed Care, Other (non HMO) | Admitting: Physical Therapy

## 2017-08-04 DIAGNOSIS — R262 Difficulty in walking, not elsewhere classified: Secondary | ICD-10-CM

## 2017-08-04 DIAGNOSIS — M6281 Muscle weakness (generalized): Secondary | ICD-10-CM

## 2017-08-05 NOTE — Therapy (Signed)
[place] [facility]ORTS MEDICINE [address], [place], [address] Phone: [phone]   Fax:  [phone]  Physical Therapy Treatment  Patient Details  Name: Christine Herring MRN: [id number] Date of Birth: [birth year] Referring Provider: Park Meo MD  Encounter Date: 08/04/2017      PT End of Session - 08/04/17 1845    Visit Number 2   Number of Visits 16   Date for PT Re-Evaluation 09/24/17   PT Start Time 1820   PT Stop Time 1905   PT Time Calculation (min) 45 min   Activity Tolerance Patient tolerated treatment well   Behavior During Therapy [facility] for tasks assessed/performed      Past Medical History:  Diagnosis Date  . Anxiety   . Hip dysplasia   . Osteoporosis   . Seizures (HCC)    when she was a child reason not know  . Thyroid disease     Past Surgical History:  Procedure Laterality Date  . TOTAL HIP ARTHROPLASTY Right   . TUBAL LIGATION      There were no vitals filed for this visit.      Subjective Assessment - 08/04/17 1842    Subjective Patient reports she is feeling stronger in right leg and is able to control it better. she still feels lop sided with right leg longer than left.    Pertinent History hx of B hip displasia, B hip labral reconstruction, osteroporosis, leg length discrepancy,  L THR 2017 and most recently right THR with abductor repair 07/07/2017   Limitations Sitting;Standing;Walking   Patient Stated Goals to get back to normal in order to be able to perform daily tasks difficulty   Currently in Pain? No/denies       Objective: Gait; ambulating with brace in place right hip; bilateral axillary crutches, PWB right LE per MD orders Strength: decreased hip flexion bilaterally sitting and hook lying: requires assistance to perform   Treatment: Therapeutic exercise: patient performed exercises with verbal, tactile cues and demonstration of therapist: goals: strength, independent with  home program, improve LEFS Sitting: Foot on weighted ball 3# on ankle for flexion/extension of knee x 25 reps Hip adduction with ball x 10 reps mild isometric holds  Knee extension without weight/quad sets x 10 Hook lying : Core control with weight bilateral flexion overhead 5# weight x 15 reps Triceps extension with 5# weight x 10 reps each UE AAROM hip flexion to 90 each LE x 10 reps  At [employer] cable exercise machine:  Patient seated on stability ball with base for support: Bilateral scapular retraction x 10 reps 10# palloff press to abdomen 10# x 10 reps Straight arm pull downs 10-15# x 10 reps  NuStep: brace on: level #4 intensity for ROM and endurance x 5 min.; no hip flexion >90   Patient response to treatment: patient demonstrated improved technique with exercises with minimal/moderate assistance and VC for correct alignment.  Improved motor control with repetition and cuing         PT Education - 08/04/17 1843    Education provided Yes   Education Details exercise instruction with good technique and within precautions, guidlines for hip replacement and abductor repair   Person(s) Educated Patient   Methods Explanation;Demonstration;Verbal cues   Comprehension Verbalized understanding;Returned demonstration;Verbal cues required             PT Long Term Goals - 07/30/17 1310      PT LONG TERM GOAL #1  Title Pt will improve LEFS score to 30/80  demonstrating improved functional mobility    Baseline LEFS 9/80   Status New   Target Date 09/04/17     PT LONG TERM GOAL #2   Title Pt will improve LEFS score to 40/80  demonstrating improved functional mobility    Baseline LEFS 9/80   Status New   Target Date 09/24/17     PT LONG TERM GOAL #3   Title Pt will score >1.0 m/s without AD on 10 MW test for improved community mobility     Baseline ambulating with bilateral axillary crutches, slow cadence   Status New   Target Date 09/24/17     PT LONG TERM GOAL #4    Title Pt. will be independent with home program for exercises for strength, endurance, ROM weekly through discharge to allow self management once discharged from physical therapy   Baseline requires guidance, instruction and assistance for exercises   Status New   Target Date 09/24/17               Plan - 08/04/17 1915    Clinical Impression Statement Patient is progressing steadily with improvement noted in control of right LE with exercises, improving knowledge of precautions for surgeries. She was able to perform exercises with assistance and moderate cuing of therapist due to significant weakness and post op precautions. She will benefit from continued physical therapy interventtion to improve strength and flexibiltiy as guided by protocols.    Rehab Potential Good   Clinical Impairments Affecting Rehab Potential (+) age, motivated, acute condition of recent right hip surgery  (-) hx of hip displasia, multiple surgeries both hips   PT Frequency 2x / week   PT Duration 8 weeks   PT Treatment/Interventions Gait training;Therapeutic exercise;Balance training;Neuromuscular re-education;Patient/family education;Manual techniques;Passive range of motion;Electrical Stimulation;Moist Heat   PT Next Visit Plan therapeutic exercises for strength, balance, endurance   PT Home Exercise Plan core strengthening, gluteal setting, quad setting, ankle pumps      Patient will benefit from skilled therapeutic intervention in order to improve the following deficits and impairments:  Abnormal gait, Decreased strength, Difficulty walking, Impaired flexibility, Impaired sensation, Pain, Increased muscle spasms, Impaired perceived functional ability, Decreased activity tolerance, Decreased endurance, Decreased range of motion  Visit Diagnosis: Muscle weakness (generalized)  Difficulty in walking, not elsewhere classified     Problem List Patient Active Problem List   Diagnosis Date Noted  .  Discharge from left nipple 02/12/2017  . Pain of left breast 02/12/2017  . Femoral anteversion of right lower extremity 01/03/2017  . Femoroacetabular impingement of right hip 01/03/2017  . Iron deficiency anemia 09/17/2016  . Status post total replacement of left hip 07/11/2016  . Chronic hypotension 07/02/2016  . Collapse of lung 07/02/2016  . H/O hepatitis 07/02/2016  . History of positive PPD 07/02/2016  . History of seizures as a child 07/02/2016  . Hypokalemia 07/02/2016  . Acquired dysplasia of left hip 04/19/2016  . Acquired dysplasia of right hip 04/19/2016  . 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 Hypothyroidism 07/17/2015  . Disorder of pelvis 07/17/2015  . OP (osteoporosis) 07/17/2015  . Lumbar radicular pain 08/04/2013  . Lumbar scoliosis 08/04/2013    Beacher MayBrooks, Marie PT 08/05/2017, 6:38 PM  [facility] [address] [place], [place], [address] Phone: [phone]   Fax:  [phone]  Name: Maxcine HamSilvana Tremain MRN: [id number] Date of Birth: 1986/12/05

## 2017-08-06 ENCOUNTER — Ambulatory Visit: Payer: Managed Care, Other (non HMO) | Admitting: Physical Therapy

## 2017-08-06 DIAGNOSIS — M6281 Muscle weakness (generalized): Secondary | ICD-10-CM

## 2017-08-06 DIAGNOSIS — R262 Difficulty in walking, not elsewhere classified: Secondary | ICD-10-CM

## 2017-08-06 DIAGNOSIS — M25551 Pain in right hip: Secondary | ICD-10-CM

## 2017-08-07 NOTE — Therapy (Signed)
[place] [facility] PHYSICAL [facility] SPORTS MEDICINE [address], [place], [address] Phone: [phone]   Fax:  [phone]  Physical Therapy Treatment  Patient Details  Name: Christine Herring MRN: [id number] Date of Birth: November 19, 1986 Referring Provider: Park Meo MD  Encounter Date: 08/06/2017      PT End of Session - 08/06/17 0942    Visit Number 3   Number of Visits 16   Date for PT Re-Evaluation 09/24/17   PT Start Time 0907   PT Stop Time 0935   PT Time Calculation (min) 28 min   Activity Tolerance Patient tolerated treatment well   Behavior During Therapy [facility] for tasks assessed/performed      Past Medical History:  Diagnosis Date  . Anxiety   . Hip dysplasia   . Osteoporosis   . Seizures (HCC)    when she was a child reason not know  . Thyroid disease     Past Surgical History:  Procedure Laterality Date  . TOTAL HIP ARTHROPLASTY Right   . TUBAL LIGATION      There were no vitals filed for this visit.      Subjective Assessment - 08/06/17 0912    Subjective Patient reports she had some burning feeling in anterior scar region today and will contact MD regarding this. She reports no redness, swelling or increased warmth in the area of burning sensation and the feeling is intermittent.    Pertinent History hx of B hip displasia, B hip labral reconstruction, osteroporosis, leg length discrepancy,  L THR 2017 and most recently right THR with abductor repair 07/07/2017   Limitations Sitting;Standing;Walking   Patient Stated Goals to get back to normal in order to be able to perform daily tasks difficulty   Currently in Pain? Yes   Pain Score 3    Pain Location Hip   Pain Orientation Right;Anterior   Pain Descriptors / Indicators Burning   Pain Type Acute pain;Surgical pain   Pain Onset Today   Pain Frequency Intermittent        Objective: Gait; ambulating with brace in place right hip; bilateral axillary crutches, PWB  right LE per MD orders  Treatment: Therapeutic exercise: patient performed exercises with verbal, tactile cues and demonstration of therapist: goals: strength, independent with home program, improve LEFS Sitting: Foot on weighted ball 3# on ankle for flexion/extension of knee x 25 reps Hip adduction with ball x 10 reps mild isometric holds   Sitting at edge of treatment table: core strengthening:  Bilateral scapular retraction 2  x 10 reps green resistive band palloff press to abdomen 2 x x 10 reps Straight arm pull downs 2  x 10 reps Single arm row doubled green resistive band x 15 reps  NuStep: brace on: level #4 intensity for ROM and endurance x 5 min.; no hip flexion >90   Patient response to treatment: Patient demonstrated improved motor control with repetition and able to perform exercises with minimal VC.             PT Education - 08/06/17 0932    Education provided Yes   Education Details exercise instruction    Person(s) Educated Patient   Methods Explanation;Demonstration;Verbal cues   Comprehension Verbalized understanding;Returned demonstration             PT Long Term Goals - 07/30/17 1310      PT LONG TERM GOAL #1   Title Pt will improve LEFS score to 30/80  demonstrating improved  functional mobility    Baseline LEFS 9/80   Status New   Target Date 09/04/17     PT LONG TERM GOAL #2   Title Pt will improve LEFS score to 40/80  demonstrating improved functional mobility    Baseline LEFS 9/80   Status New   Target Date 09/24/17     PT LONG TERM GOAL #3   Title Pt will score >1.0 m/s without AD on 10 MW test for improved community mobility     Baseline ambulating with bilateral axillary crutches, slow cadence   Status New   Target Date 09/24/17     PT LONG TERM GOAL #4   Title Pt. will be independent with home program for exercises for strength, endurance, ROM weekly through discharge to allow self management once discharged from physical  therapy   Baseline requires guidance, instruction and assistance for exercises   Status New   Target Date 09/24/17               Plan - 08/06/17 0933    Clinical Impression Statement patient demonstrated improved motor control right LE with exercises and was able to perform all exercises with minimal to moderate VC. She is to contact MD regarding burning sensation in front of hip over incision.    Rehab Potential Good   Clinical Impairments Affecting Rehab Potential (+) age, motivated, acute condition of recent right hip surgery  (-) hx of hip displasia, multiple surgeries both hips   PT Frequency 2x / week   PT Duration 8 weeks   PT Treatment/Interventions Gait training;Therapeutic exercise;Balance training;Neuromuscular re-education;Patient/family education;Manual techniques;Passive range of motion;Electrical Stimulation;Moist Heat   PT Next Visit Plan therapeutic exercises for strength, balance, endurance   PT Home Exercise Plan core strengthening, gluteal setting, quad setting, ankle pumps      Patient will benefit from skilled therapeutic intervention in order to improve the following deficits and impairments:  Abnormal gait, Decreased strength, Difficulty walking, Impaired flexibility, Impaired sensation, Pain, Increased muscle spasms, Impaired perceived functional ability, Decreased activity tolerance, Decreased endurance, Decreased range of motion  Visit Diagnosis: Muscle weakness (generalized)  Difficulty in walking, not elsewhere classified  Pain in right hip     Problem List Patient Active Problem List   Diagnosis Date Noted  . Discharge from left nipple 02/12/2017  . Pain of left breast 02/12/2017  . Femoral anteversion of right lower extremity 01/03/2017  . Femoroacetabular impingement of right hip 01/03/2017  . Iron deficiency anemia 09/17/2016  . Status post total replacement of left hip 07/11/2016  . Chronic hypotension 07/02/2016  . Collapse of lung  07/02/2016  . H/O hepatitis 07/02/2016  . History of positive PPD 07/02/2016  . History of seizures as a child 07/02/2016  . Hypokalemia 07/02/2016  . Acquired dysplasia of left hip 04/19/2016  . Acquired dysplasia of right hip 04/19/2016  . 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 Hypothyroidism 07/17/2015  . Disorder of pelvis 07/17/2015  . OP (osteoporosis) 07/17/2015  . Lumbar radicular pain 08/04/2013  . Lumbar scoliosis 08/04/2013    Beacher MayBrooks, Nekayla Heider PT 08/07/2017, 12:45 PM  [facility] [address] [place], [place], [address] Phone: [phone]   Fax:  [phone]  Name: Christine Herring MRN: [id number] Date of Birth: 11/16/1986

## 2017-08-11 ENCOUNTER — Encounter: Payer: Self-pay | Admitting: Physical Therapy

## 2017-08-11 ENCOUNTER — Ambulatory Visit: Payer: Managed Care, Other (non HMO) | Admitting: Physical Therapy

## 2017-08-11 DIAGNOSIS — M6281 Muscle weakness (generalized): Secondary | ICD-10-CM

## 2017-08-11 DIAGNOSIS — M25551 Pain in right hip: Secondary | ICD-10-CM

## 2017-08-11 DIAGNOSIS — R262 Difficulty in walking, not elsewhere classified: Secondary | ICD-10-CM

## 2017-08-12 NOTE — Therapy (Signed)
[facility] [address] [place], [address] Phone: [phone]   Fax:  [phone]  Physical Therapy Treatment  Patient Details  Name: Christine Herring MRN: [id number] Date of Birth: Oct 06, 1986 Referring Provider: Durward Mallard MD  Encounter Date: 08/11/2017      PT End of Session - 08/11/17 1245    Visit Number 4   Number of Visits 16   Date for PT Re-Evaluation 09/24/17   PT Start Time 1202   PT Stop Time 1244   PT Time Calculation (min) 42 min   Activity Tolerance Patient tolerated treatment well   Behavior During Therapy [facility] for tasks assessed/performed      Past Medical History:  Diagnosis Date  . Anxiety   . Hip dysplasia   . Osteoporosis   . Seizures (HCC)    when she was a child reason not know  . Thyroid disease     Past Surgical History:  Procedure Laterality Date  . TOTAL HIP ARTHROPLASTY Right   . TUBAL LIGATION      There were no vitals filed for this visit.      Subjective Assessment - 08/11/17 1204    Subjective Patient reports she is still having mild burning intermittently along anterior hip/scar region, no redness or warmth noted over incision. She reports she has a WC that is too small, 16" wide and that she sat in it with pressure on her brace and that had irritated her thigh.    Pertinent History hx of B hip displasia, B hip labral reconstruction, osteroporosis, leg length discrepancy,  L THR 2017 and most recently right THR with abductor repair 07/07/2017   Limitations Sitting;Standing;Walking   Patient Stated Goals to get back to normal in order to be able to perform daily tasks difficulty   Currently in Pain? Yes   Pain Score 4    Pain Location Hip   Pain Orientation Right;Anterior   Pain Descriptors / Indicators Burning   Pain Type Acute pain;Surgical pain   Pain Onset Today   Pain Frequency Intermittent      Objective: Gait; ambulating with brace in place  right hip; bilateral axillary crutches, PWB right Christine per MD orders Observation; right hip region with brace off: small abrasions along old scars where brace has rubbed (<1/4" each area, no drainage, dry) placed band aids over areas to avoid irritation to skin with brace on, increased warmth with mild redness noted at distal and lateral area to incision, patient informed to observe this area and contact MD if the redness or warmth increases   Treatment: Positioned patient in WC that is 18" deep and 20" wide and discussed with her to contact MD regarding changing out her current WC for more appropriate size to accommodate her brace and abducted hip  Therapeutic exercise: patient performed exercises with verbal, tactile cues and demonstration of therapist: goals: strength, independent with home program, improve LEFS Sitting: Foot on weighted ball  for flexion/extension of knee x 25 reps Hip adduction with ball x 10 reps mild isometric holds  Sitting at edge of treatment table: core strengthening: using green + red resistive bands with assist of therapist for positioning and intensity Bilateral scapular retraction 2  x 10 reps  palloff press to abdomen 2 x x 10 reps Straight arm pull downs 2  x 10 reps Single arm row doubled green resistive band 2 x 15 reps  Supine lying: performed with assist of therapist,  right Christine: Passive abduction, AAROM hip adduction 2 x 5 reps (Brace off) Hip/knee flexion heel slides independently with brace on  Manual therapy: 8 min.  STM performed along scar right hip with patient supine lying with knee supported by pillow; superficial techniques to improve soft tissue, scar mobility  Patient response to treatment: Patient demonstrated improved motor control with Christine exercises with minimal cuing. Improved soft tissue elasticity along scar following STM.         PT Education - 08/11/17 1230    Education provided Yes   Education Details exercise instruction for  positoining, technique, care for scare tissue, brace irritating old scars, precautions for increased redness and warmth in thigh   Person(s) Educated Patient   Methods Explanation   Comprehension Verbalized understanding             PT Long Term Goals - 07/30/17 1310      PT LONG TERM GOAL #1   Title Pt will improve LEFS score to 30/80  demonstrating improved functional mobility    Baseline LEFS 9/80   Status New   Target Date 09/04/17     PT LONG TERM GOAL #2   Title Pt will improve LEFS score to 40/80  demonstrating improved functional mobility    Baseline LEFS 9/80   Status New   Target Date 09/24/17     PT LONG TERM GOAL #3   Title Pt will score >1.0 m/s without AD on 10 MW test for improved community mobility     Baseline ambulating with bilateral axillary crutches, slow cadence   Status New   Target Date 09/24/17     PT LONG TERM GOAL #4   Title Pt. will be independent with home program for exercises for strength, endurance, ROM weekly through discharge to allow self management once discharged from physical therapy   Baseline requires guidance, instruction and assistance for exercises   Status New   Target Date 09/24/17               Plan - 08/11/17 1245    Clinical Impression Statement Patient verbalized good understanding of home program and to be aware of scar irritation by brace. She demonstrated improved hip abduction (passive) and improved hip/knee flexion with heel slides demonstrating improvement with strength in right Christine. She continues with significant weakness and decreased endurance s/p surgery and will benefit from continued physical therapy intervention to address limitations and achieve goals.    Rehab Potential Good   Clinical Impairments Affecting Rehab Potential (+) age, motivated, acute condition of recent right hip surgery  (-) hx of hip displasia, multiple surgeries both hips   PT Frequency 2x / week   PT Duration 8 weeks   PT  Treatment/Interventions Gait training;Therapeutic exercise;Balance training;Neuromuscular re-education;Patient/family education;Manual techniques;Passive range of motion;Electrical Stimulation;Moist Heat   PT Next Visit Plan therapeutic exercises for strength, balance, endurance   PT Home Exercise Plan core strengthening, gluteal setting, quad setting, ankle pumps      Patient will benefit from skilled therapeutic intervention in order to improve the following deficits and impairments:  Abnormal gait, Decreased strength, Difficulty walking, Impaired flexibility, Impaired sensation, Pain, Increased muscle spasms, Impaired perceived functional ability, Decreased activity tolerance, Decreased endurance, Decreased range of motion  Visit Diagnosis: Muscle weakness (generalized)  Difficulty in walking, not elsewhere classified  Pain in right hip     Problem List Patient Active Problem List   Diagnosis Date Noted  . Discharge from left nipple 02/12/2017  . Pain  of left breast 02/12/2017  . Femoral anteversion of right lower extremity 01/03/2017  . Femoroacetabular impingement of right hip 01/03/2017  . Iron deficiency anemia 09/17/2016  . Status post total replacement of left hip 07/11/2016  . Chronic hypotension 07/02/2016  . Collapse of lung 07/02/2016  . H/O hepatitis 07/02/2016  . History of positive PPD 07/02/2016  . History of seizures as a child 07/02/2016  . Hypokalemia 07/02/2016  . Acquired dysplasia of left hip 04/19/2016  . Acquired dysplasia of right hip 04/19/2016  . 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 Hypothyroidism 07/17/2015  . Disorder of pelvis 07/17/2015  . OP (osteoporosis) 07/17/2015  . Lumbar radicular pain 08/04/2013  . Lumbar scoliosis 08/04/2013    Beacher May PT 08/12/2017, 9:03 AM  [facility] PHYSICAL AND SPORTS MEDICINE [address], [place], [address] Phone: [phone]    Fax:  [phone]  Name: Christine Herring MRN: [id number] Date of Birth: May 14, 1986

## 2017-08-13 ENCOUNTER — Encounter: Payer: Self-pay | Admitting: Physical Therapy

## 2017-08-13 ENCOUNTER — Ambulatory Visit: Payer: Managed Care, Other (non HMO) | Admitting: Physical Therapy

## 2017-08-13 DIAGNOSIS — M25551 Pain in right hip: Secondary | ICD-10-CM

## 2017-08-13 DIAGNOSIS — R262 Difficulty in walking, not elsewhere classified: Secondary | ICD-10-CM

## 2017-08-13 DIAGNOSIS — M6281 Muscle weakness (generalized): Secondary | ICD-10-CM

## 2017-08-13 NOTE — Therapy (Signed)
[facility] [address], [place], [address] Phone: [phone]   Fax:  [phone]  Physical Therapy Treatment  Patient Details  Name: Christine Herring MRN: [id number] Date of Birth: [birth year] Referring Provider: Park Meo MD  Encounter Date: 08/13/2017      PT End of Session - 08/13/17 1134    Visit Number 5   Number of Visits 16   Date for PT Re-Evaluation 09/24/17   PT Start Time 1128   PT Stop Time 1200   PT Time Calculation (min) 32 min   Activity Tolerance Patient tolerated treatment well   Behavior During Therapy [facility] for tasks assessed/performed      Past Medical History:  Diagnosis Date  . Anxiety   . Hip dysplasia   . Osteoporosis   . Seizures (HCC)    when she was a child reason not know  . Thyroid disease     Past Surgical History:  Procedure Laterality Date  . TOTAL HIP ARTHROPLASTY Right   . TUBAL LIGATION      There were no vitals filed for this visit.      Subjective Assessment - 08/13/17 1130    Subjective Patient reports she is still having mild burning intermittently along anterior hip/scar region, no redness or warmth noted over incision.    Pertinent History hx of B hip displasia, B hip labral reconstruction, osteroporosis, leg length discrepancy,  L THR 2017 and most recently right THR with abductor repair 07/07/2017   Limitations Sitting;Standing;Walking   Patient Stated Goals to get back to normal in order to be able to perform daily tasks difficulty   Currently in Pain? No/denies         Objective: Gait; ambulating with brace in place right hip; bilateral axillary crutches, PWB right LE per MD orders Observation; right hip anterior aspect with healing incision, no increased warmth, redness noted Palpation: tender along antero lateral hip right LE  Treatment: Therapeutic exercise: patient performed exercises with verbal, tactile cues and demonstration of  therapist: goals: strength, independent with home program, improve LEFS Sitting: Foot on weighted ball  for flexion/extension of knee x 25 reps  Sitting at edge of treatment table: core strengthening: using green + red resistive bands with assist of therapist for positioning and intensity Bilateral scapular retraction 2 x 15 reps  palloff press to abdomen 2  x 15 reps Straight arm pull downs 2 x 15 reps Single arm row doubled green resistive band 2 x 15 reps  Supine lying: performed with assist of therapist, right LE: Passive abduction, AAROM hip adduction 2 x 5 reps (Brace off) Hip/knee flexion heel slides independently with brace on  Manual therapy: 5 min.  STM performed along scar right hip with patient supine lying with knee supported by pillow; superficial techniques to improve soft tissue, scar mobility  Patient response to treatment: Patient demonstrated improved strength, motor control and endurance with all exercises.  Improved soft tissue elasticity along scar following STM with improved tenderness.        PT Education - 08/13/17 1132    Education provided Yes   Education Details exercise instruction for positioning, technique   Person(s) Educated Patient   Methods Explanation;Demonstration;Verbal cues   Comprehension Verbalized understanding;Returned demonstration;Verbal cues required             PT Long Term Goals - 07/30/17 1310      PT LONG TERM GOAL #1   Title Pt will  improve LEFS score to 30/80  demonstrating improved functional mobility    Baseline LEFS 9/80   Status New   Target Date 09/04/17     PT LONG TERM GOAL #2   Title Pt will improve LEFS score to 40/80  demonstrating improved functional mobility    Baseline LEFS 9/80   Status New   Target Date 09/24/17     PT LONG TERM GOAL #3   Title Pt will score >1.0 m/s without AD on 10 MW test for improved community mobility     Baseline ambulating with bilateral axillary crutches, slow cadence    Status New   Target Date 09/24/17     PT LONG TERM GOAL #4   Title Pt. will be independent with home program for exercises for strength, endurance, ROM weekly through discharge to allow self management once discharged from physical therapy   Baseline requires guidance, instruction and assistance for exercises   Status New   Target Date 09/24/17               Plan - 08/13/17 1228    Clinical Impression Statement Patient is progressing steadily with strength and ROM right hip and is able to increase weight bearing through right LE with less discomfort.    Rehab Potential Good   Clinical Impairments Affecting Rehab Potential (+) age, motivated, acute condition of recent right hip surgery  (-) hx of hip displasia, multiple surgeries both hips   PT Frequency 2x / week   PT Duration 8 weeks   PT Treatment/Interventions Gait training;Therapeutic exercise;Balance training;Neuromuscular re-education;Patient/family education;Manual techniques;Passive range of motion;Electrical Stimulation;Moist Heat   PT Next Visit Plan therapeutic exercises for strength, balance, endurance   PT Home Exercise Plan core strengthening, gluteal setting, quad setting, ankle pumps      Patient will benefit from skilled therapeutic intervention in order to improve the following deficits and impairments:  Abnormal gait, Decreased strength, Difficulty walking, Impaired flexibility, Impaired sensation, Pain, Increased muscle spasms, Impaired perceived functional ability, Decreased activity tolerance, Decreased endurance, Decreased range of motion  Visit Diagnosis: Muscle weakness (generalized)  Difficulty in walking, not elsewhere classified  Pain in right hip     Problem List Patient Active Problem List   Diagnosis Date Noted  . Discharge from left nipple 02/12/2017  . Pain of left breast 02/12/2017  . Femoral anteversion of right lower extremity 01/03/2017  . Femoroacetabular impingement of right  hip 01/03/2017  . Iron deficiency anemia 09/17/2016  . Status post total replacement of left hip 07/11/2016  . Chronic hypotension 07/02/2016  . Collapse of lung 07/02/2016  . H/O hepatitis 07/02/2016  . History of positive PPD 07/02/2016  . History of seizures as a child 07/02/2016  . Hypokalemia 07/02/2016  . Acquired dysplasia of left hip 04/19/2016  . Acquired dysplasia of right hip 04/19/2016  . 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 Hypothyroidism 07/17/2015  . Disorder of pelvis 07/17/2015  . OP (osteoporosis) 07/17/2015  . Lumbar radicular pain 08/04/2013  . Lumbar scoliosis 08/04/2013    Beacher May PT 08/14/2017, 3:22 PM  [facility] PHYSICAL AND SPORTS MEDICINE [address], [place], [address] Phone: [phone]   Fax:  [phone]  Name: Christine Herring MRN: [id number] Date of Birth: [birth year]

## 2017-08-19 ENCOUNTER — Ambulatory Visit: Payer: Managed Care, Other (non HMO) | Admitting: Physical Therapy

## 2017-08-21 ENCOUNTER — Encounter: Payer: Self-pay | Admitting: Physical Therapy

## 2017-08-21 ENCOUNTER — Ambulatory Visit: Payer: Managed Care, Other (non HMO) | Admitting: Physical Therapy

## 2017-08-21 DIAGNOSIS — M25551 Pain in right hip: Secondary | ICD-10-CM

## 2017-08-21 DIAGNOSIS — M6281 Muscle weakness (generalized): Secondary | ICD-10-CM | POA: Diagnosis not present

## 2017-08-21 DIAGNOSIS — R262 Difficulty in walking, not elsewhere classified: Secondary | ICD-10-CM

## 2017-08-21 NOTE — Therapy (Signed)
[facility] [address], [place], [address] Phone: [phone]   Fax:  [phone]  Physical Therapy Treatment  Patient Details  Name: Christine Herring MRN: [id number] Date of Birth: 15-Jul-1986 Referring Provider: Park Meo MD  Encounter Date: 08/21/2017      PT End of Session - 08/21/17 1114    Visit Number 6   Number of Visits 16   Date for PT Re-Evaluation 09/24/17   PT Start Time 1112   PT Stop Time 1203   PT Time Calculation (min) 51 min   Activity Tolerance Patient tolerated treatment well   Behavior During Therapy [facility] for tasks assessed/performed      Past Medical History:  Diagnosis Date  . Anxiety   . Hip dysplasia   . Osteoporosis   . Seizures (HCC)    when she was a child reason not know  . Thyroid disease     Past Surgical History:  Procedure Laterality Date  . TOTAL HIP ARTHROPLASTY Right   . TUBAL LIGATION      There were no vitals filed for this visit.      Subjective Assessment - 08/21/17 1113    Subjective Patient reports she is going to see MD tomorrow and is still having burning superficially anterior aspect of hip.    Pertinent History hx of B hip displasia, B hip labral reconstruction, osteroporosis, leg length discrepancy,  L THR 2017 and most recently right THR with abductor repair 07/07/2017   Limitations Sitting;Standing;Walking   Patient Stated Goals to get back to normal in order to be able to perform daily tasks difficulty   Currently in Pain? No/denies        Objective: Gait; ambulating with brace in place right hip; bilateral axillary crutches, PWB right LE per MD orders Observation; right hip anterior aspect with healing incision, no increased warmth, redness noted Palpation: tender along antero lateral hip right LE  Treatment: Therapeutic exercise: patient performed exercises with verbal, tactile cues and demonstration of therapist: goals:  strength, independent with home program, improve LEFS Sitting: Foot on weighted ball for flexion/extension of knee x 25 reps  Sitting at edge of treatment table: core strengthening: using green + red resistive bands with assist of therapist for positioning and intensity Bilateral scapular retraction 2 x 15 reps  palloff press to abdomen 2  x 15 reps Straight arm pull downs 2 x 15 reps Single arm row doubled green resistive band 2 x 15 reps  Supine lying: performed with assist of therapist, right LE: Passive abduction, AAROM hip adduction 2 x 10 reps (Brace off) Hip/knee flexion heel slides independently with brace on  Manual therapy: 10 min.  STM performed along scar right hip with patient supine lying with knee supported by pillow; superficial techniques to improve soft tissue, scar mobility  Patient response to treatment: Patient demonstrated improved motor control and hip ROM WFL with exercises. Decreased tenderness and improved soft tissue elasticity along anterior/lateral hip, thigh with STM         PT Education - 08/21/17 1132    Education provided Yes   Education Details exercise instruction for proper technique   Person(s) Educated Patient   Methods Demonstration;Verbal cues;Explanation   Comprehension Verbalized understanding;Returned demonstration;Verbal cues required             PT Long Term Goals - 07/30/17 1310      PT LONG TERM GOAL #1   Title Pt will improve  LEFS score to 30/80  demonstrating improved functional mobility    Baseline LEFS 9/80   Status New   Target Date 09/04/17     PT LONG TERM GOAL #2   Title Pt will improve LEFS score to 40/80  demonstrating improved functional mobility    Baseline LEFS 9/80   Status New   Target Date 09/24/17     PT LONG TERM GOAL #3   Title Pt will score >1.0 m/s without AD on 10 MW test for improved community mobility     Baseline ambulating with bilateral axillary crutches, slow cadence   Status New    Target Date 09/24/17     PT LONG TERM GOAL #4   Title Pt. will be independent with home program for exercises for strength, endurance, ROM weekly through discharge to allow self management once discharged from physical therapy   Baseline requires guidance, instruction and assistance for exercises   Status New   Target Date 09/24/17               Plan - 08/21/17 1114    Clinical Impression Statement Patient is progressing with exercises. She is limited in exercises she is able to perform based on protocols for hip abductor repair and anterior THA. She should continue to improve and progress with additional physical therapy intervention and as ordered by MD.    Rehab Potential Good   Clinical Impairments Affecting Rehab Potential (+) age, motivated, acute condition of recent right hip surgery  (-) hx of hip displasia, multiple surgeries both hips   PT Frequency 2x / week   PT Duration 8 weeks   PT Treatment/Interventions Gait training;Therapeutic exercise;Balance training;Neuromuscular re-education;Patient/family education;Manual techniques;Passive range of motion;Electrical Stimulation;Moist Heat   PT Next Visit Plan therapeutic exercises for strength, balance, endurance   PT Home Exercise Plan core strengthening, gluteal setting, quad setting, ankle pumps      Patient will benefit from skilled therapeutic intervention in order to improve the following deficits and impairments:  Abnormal gait, Decreased strength, Difficulty walking, Impaired flexibility, Impaired sensation, Pain, Increased muscle spasms, Impaired perceived functional ability, Decreased activity tolerance, Decreased endurance, Decreased range of motion  Visit Diagnosis: Muscle weakness (generalized)  Difficulty in walking, not elsewhere classified  Pain in right hip     Problem List Patient Active Problem List   Diagnosis Date Noted  . Discharge from left nipple 02/12/2017  . Pain of left breast  02/12/2017  . Femoral anteversion of right lower extremity 01/03/2017  . Femoroacetabular impingement of right hip 01/03/2017  . Iron deficiency anemia 09/17/2016  . Status post total replacement of left hip 07/11/2016  . Chronic hypotension 07/02/2016  . Collapse of lung 07/02/2016  . H/O hepatitis 07/02/2016  . History of positive PPD 07/02/2016  . History of seizures as a child 07/02/2016  . Hypokalemia 07/02/2016  . Acquired dysplasia of left hip 04/19/2016  . Acquired dysplasia of right hip 04/19/2016  . 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 Hypothyroidism 07/17/2015  . Disorder of pelvis 07/17/2015  . OP (osteoporosis) 07/17/2015  . Lumbar radicular pain 08/04/2013  . Lumbar scoliosis 08/04/2013    Beacher MayBrooks, Bentley Haralson PT 08/22/2017, 9:05 AM  [place] [address] [place], [place], [id number] Phone: [phone]   Fax:  [phone]  Name: Christine Herring MRN: [id number] Date of Birth: 1986-08-30

## 2017-08-26 ENCOUNTER — Ambulatory Visit: Payer: Managed Care, Other (non HMO) | Attending: Orthopedic Surgery | Admitting: Physical Therapy

## 2017-08-26 ENCOUNTER — Encounter: Payer: Self-pay | Admitting: Physical Therapy

## 2017-08-26 DIAGNOSIS — M6281 Muscle weakness (generalized): Secondary | ICD-10-CM | POA: Insufficient documentation

## 2017-08-26 DIAGNOSIS — R262 Difficulty in walking, not elsewhere classified: Secondary | ICD-10-CM | POA: Diagnosis present

## 2017-08-26 DIAGNOSIS — M25551 Pain in right hip: Secondary | ICD-10-CM | POA: Diagnosis present

## 2017-08-26 DIAGNOSIS — R2689 Other abnormalities of gait and mobility: Secondary | ICD-10-CM | POA: Diagnosis present

## 2017-08-27 NOTE — Therapy (Signed)
[facility] [address] [place], [address] Phone: [phone]   Fax:  [phone]  Physical Therapy Treatment  Patient Details  Name: Christine Herring MRN: [id number] Date of Birth: 1986/10/13 Referring Provider: Park Meo MD  Encounter Date: 08/26/2017      PT End of Session - 08/26/17 1748    Visit Number 7   Number of Visits 16   Date for PT Re-Evaluation 09/24/17   PT Start Time 1120   PT Stop Time 1205   PT Time Calculation (min) 45 min   Activity Tolerance Patient tolerated treatment well   Behavior During Therapy [facility] for tasks assessed/performed      Past Medical History:  Diagnosis Date  . Anxiety   . Hip dysplasia   . Osteoporosis   . Seizures (HCC)    when she was a child reason not know  . Thyroid disease     Past Surgical History:  Procedure Laterality Date  . TOTAL HIP ARTHROPLASTY Right   . TUBAL LIGATION      There were no vitals filed for this visit.      Subjective Assessment - 08/26/17 1124    Subjective Patient reports she has seen the MD and is healing s/p hip replacement and abductor repair.    Pertinent History hx of B hip displasia, B hip labral reconstruction, osteroporosis, leg length discrepancy,  L THR 2017 and most recently right THR with abductor repair 07/07/2017   Limitations Sitting;Standing;Walking   Patient Stated Goals to get back to normal in order to be able to perform daily tasks difficulty   Currently in Pain? No/denies      Objective: Gait; ambulating with brace in place right hip; bilateral axillary crutches; able to ambulate without brace using crutches  Palpation: tender along antero lateral hip right LE  Treatment: Therapeutic exercise: patient performed exercises with verbal, tactile cues and demonstration of therapist: goals: strength, independent with home program, improve LEFS Sitting: Foot on weighted ball for flexion/extension of  knee x 25 reps  Sitting at edge of treatment table: core strengthening: using green + red resistive bands with assist of therapist for positioning and intensity Bilateral scapular retraction 2 x 15reps  palloff press to abdomen 2 x 15reps Straight arm pull downs 2 x 15reps Single arm row doubled green resistive band 2 x 15 reps  Supine lying: performed with assist of therapist, right LE: AAROM right hip abduction/adduction 2 x 5 reps Hip/knee flexion heel slides independently   NuStep: 5 min. Level #3 intensity, seat #9; required assistance and VC to perform with proper knee alignment and avoid IR of hip/LE  Manual therapy:8 min.  STM performed along scar right hip with patient supine lying with knee supported by pillow; superficial techniques to improve soft tissue, scar mobility  Patient response to treatment: Patient required assistance to perform hip exercises in supine, improved motor control with repetition. Decreased tenderness and improved soft tissue elasticity along anterior/lateral hip, thigh with STM           PT Education - 08/26/17 1200    Education provided Yes   Education Details exercise instruction   Person(s) Educated Patient   Methods Explanation;Verbal cues   Comprehension Verbalized understanding;Verbal cues required             PT Long Term Goals - 07/30/17 1310      PT LONG TERM GOAL #1   Title Pt will improve LEFS  score to 30/80  demonstrating improved functional mobility    Baseline LEFS 9/80   Status New   Target Date 09/04/17     PT LONG TERM GOAL #2   Title Pt will improve LEFS score to 40/80  demonstrating improved functional mobility    Baseline LEFS 9/80   Status New   Target Date 09/24/17     PT LONG TERM GOAL #3   Title Pt will score >1.0 m/s without AD on 10 MW test for improved community mobility     Baseline ambulating with bilateral axillary crutches, slow cadence   Status New   Target Date 09/24/17     PT  LONG TERM GOAL #4   Title Pt. will be independent with home program for exercises for strength, endurance, ROM weekly through discharge to allow self management once discharged from physical therapy   Baseline requires guidance, instruction and assistance for exercises   Status New   Target Date 09/24/17               Plan - 08/26/17 1210    Clinical Impression Statement patient is progressing steadily with improvement noted in ROM, strength right hip, LE and is now able to ambulate with axillary crutches. she continues with limitations in strength as she heals from recent surgery and will require continued physical therapy intervention to achieve goals.    Rehab Potential Good   Clinical Impairments Affecting Rehab Potential (+) age, motivated, acute condition of recent right hip surgery  (-) hx of hip displasia, multiple surgeries both hips   PT Frequency 2x / week   PT Duration 8 weeks   PT Treatment/Interventions Gait training;Therapeutic exercise;Balance training;Neuromuscular re-education;Patient/family education;Manual techniques;Passive range of motion;Electrical Stimulation;Moist Heat   PT Next Visit Plan therapeutic exercises for strength, balance, endurance   PT Home Exercise Plan core strengthening, gluteal setting, quad setting, ankle pumps      Patient will benefit from skilled therapeutic intervention in order to improve the following deficits and impairments:  Abnormal gait, Decreased strength, Difficulty walking, Impaired flexibility, Impaired sensation, Pain, Increased muscle spasms, Impaired perceived functional ability, Decreased activity tolerance, Decreased endurance, Decreased range of motion  Visit Diagnosis: Muscle weakness (generalized)  Difficulty in walking, not elsewhere classified  Pain in right hip     Problem List Patient Active Problem List   Diagnosis Date Noted  . Discharge from left nipple 02/12/2017  . Pain of left breast 02/12/2017  .  Femoral anteversion of right lower extremity 01/03/2017  . Femoroacetabular impingement of right hip 01/03/2017  . Iron deficiency anemia 09/17/2016  . Status post total replacement of left hip 07/11/2016  . Chronic hypotension 07/02/2016  . Collapse of lung 07/02/2016  . H/O hepatitis 07/02/2016  . History of positive PPD 07/02/2016  . History of seizures as a child 07/02/2016  . Hypokalemia 07/02/2016  . Acquired dysplasia of left hip 04/19/2016  . Acquired dysplasia of right hip 04/19/2016  . 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 Hypothyroidism 07/17/2015  . Disorder of pelvis 07/17/2015  . OP (osteoporosis) 07/17/2015  . Lumbar radicular pain 08/04/2013  . Lumbar scoliosis 08/04/2013    Beacher MayBrooks, Perl Kerney PT 08/27/2017, 6:09 PM  [facility] [address] [place], [place], [address] Phone: [phone]   Fax:  [phone]  Name: Maxcine HamSilvana Delaguila MRN: [id number] Date of Birth: January 08, 1986

## 2017-08-28 ENCOUNTER — Telehealth: Payer: Self-pay | Admitting: Nurse Practitioner

## 2017-08-28 ENCOUNTER — Encounter: Payer: Self-pay | Admitting: Physical Therapy

## 2017-08-28 ENCOUNTER — Ambulatory Visit: Payer: Managed Care, Other (non HMO) | Admitting: Physical Therapy

## 2017-08-28 DIAGNOSIS — D509 Iron deficiency anemia, unspecified: Secondary | ICD-10-CM

## 2017-08-28 DIAGNOSIS — E034 Atrophy of thyroid (acquired): Secondary | ICD-10-CM

## 2017-08-28 DIAGNOSIS — M6281 Muscle weakness (generalized): Secondary | ICD-10-CM

## 2017-08-28 DIAGNOSIS — R262 Difficulty in walking, not elsewhere classified: Secondary | ICD-10-CM

## 2017-08-28 DIAGNOSIS — M25551 Pain in right hip: Secondary | ICD-10-CM

## 2017-08-28 NOTE — Telephone Encounter (Signed)
Pt would like to do labs to check thyroid and hemoglobin.  Her call back number is [phone]

## 2017-08-28 NOTE — Therapy (Signed)
[facility] [address] [place], [place], [address] Phone: [phone]   Fax:  [phone]  Physical Therapy Treatment  Patient Details  Name: Christine Herring MRN: [id number] Date of Birth: March 28, 1986 Referring Provider: Park MeoLewis, Bryan D. MD  Encounter Date: 08/28/2017      PT End of Session - 08/28/17 1124    Visit Number 8   Number of Visits 16   Date for PT Re-Evaluation 09/24/17   PT Start Time 1122   PT Stop Time 1204   PT Time Calculation (min) 42 min   Activity Tolerance Patient tolerated treatment well   Behavior During Therapy [facility] for tasks assessed/performed      Past Medical History:  Diagnosis Date  . Anxiety   . Hip dysplasia   . Osteoporosis   . Seizures (HCC)    when she was a child reason not know  . Thyroid disease     Past Surgical History:  Procedure Laterality Date  . TOTAL HIP ARTHROPLASTY Right   . TUBAL LIGATION      There were no vitals filed for this visit.      Subjective Assessment - 08/28/17 1124    Subjective Patient reports she has seen the MD and is healing s/p hip replacement and abductor repair.    Pertinent History hx of B hip displasia, B hip labral reconstruction, osteroporosis, leg length discrepancy,  L THR 2017 and most recently right THR with abductor repair 07/07/2017   Limitations Sitting;Standing;Walking   Patient Stated Goals to get back to normal in order to be able to perform daily tasks difficulty   Currently in Pain? No/denies      Objective: Gait; ambulating without brace on right hip; bilateral axillary crutches, limp on right with knee knee in flexion throughout gait cycle Palpation: tender along antero lateral hip right LE  Treatment: Therapeutic exercise: patient performed exercises with verbal, tactile cues and demonstration of therapist: goals: strength, independent with home program, improve LEFS Sitting: Hip adduction with ball and glute  sets x 10  Sitting at OMEGA: core strengthening: with assist of therapist for positioning and intensity palloff press to abdomen 1 x 15 reps 15# Straight arm pull downs 2 x 15reps 15# Reverse chin ups 15# 2 x 15 reps  Supine lying: performed with assist of therapist, right LE: AAROM right hip abduction/adduction 2 x 5 reps Hip/knee flexion heel slides independently   NuStep: 10 min. Level #4intensity, seat #9; required assistance and minimal VC to perform with proper knee alignment and avoid IR of hip/LE  Manual therapy:398min.  STM performed along scar right hip with patient supine lying with knee supported by pillow; superficial techniques to improve soft tissue, scar mobility  Patient response to treatment: patient required assistance to perform hip exercises in supine for hip abduction/adduction. Improved motor control with repetition. decresaed tenderness with improved soft tissue elasticity along right thigh/hip following STM. patient reported soreness in left glutes and LE following exercise, NuStep         PT Long Term Goals - 07/30/17 1310      PT LONG TERM GOAL #1   Title Pt will improve LEFS score to 30/80  demonstrating improved functional mobility    Baseline LEFS 9/80   Status New   Target Date 09/04/17     PT LONG TERM GOAL #2   Title Pt will improve LEFS score to 40/80  demonstrating improved functional mobility    Baseline LEFS  9/80   Status New   Target Date 09/24/17     PT LONG TERM GOAL #3   Title Pt will score >1.0 m/s without AD on 10 MW test for improved community mobility     Baseline ambulating with bilateral axillary crutches, slow cadence   Status New   Target Date 09/24/17     PT LONG TERM GOAL #4   Title Pt. will be independent with home program for exercises for strength, endurance, ROM weekly through discharge to allow self management once discharged from physical therapy   Baseline requires guidance, instruction and assistance for  exercises   Status New   Target Date 09/24/17               Plan - 08/28/17 1200    Clinical Impression Statement Patient is progressing with increased intensity with exercises and continues to require moderate assistance of therapist for LE exercises due to precautions, decreased strength in right LE. She will require additional physical therapy intervention to improve function/strength to return to full function.    Rehab Potential Good   Clinical Impairments Affecting Rehab Potential (+) age, motivated, acute condition of recent right hip surgery  (-) hx of hip displasia, multiple surgeries both hips   PT Frequency 2x / week   PT Duration 8 weeks   PT Treatment/Interventions Gait training;Therapeutic exercise;Balance training;Neuromuscular re-education;Patient/family education;Manual techniques;Passive range of motion;Electrical Stimulation;Moist Heat   PT Next Visit Plan therapeutic exercises for strength, balance, endurance   PT Home Exercise Plan core strengthening, gluteal setting, quad setting, ankle pumps      Patient will benefit from skilled therapeutic intervention in order to improve the following deficits and impairments:  Abnormal gait, Decreased strength, Difficulty walking, Impaired flexibility, Impaired sensation, Pain, Increased muscle spasms, Impaired perceived functional ability, Decreased activity tolerance, Decreased endurance, Decreased range of motion  Visit Diagnosis: Muscle weakness (generalized)  Difficulty in walking, not elsewhere classified  Pain in right hip     Problem List Patient Active Problem List   Diagnosis Date Noted  . Discharge from left nipple 02/12/2017  . Pain of left breast 02/12/2017  . Femoral anteversion of right lower extremity 01/03/2017  . Femoroacetabular impingement of right hip 01/03/2017  . Iron deficiency anemia 09/17/2016  . Status post total replacement of left hip 07/11/2016  . Chronic hypotension 07/02/2016  .  Collapse of lung 07/02/2016  . H/O hepatitis 07/02/2016  . History of positive PPD 07/02/2016  . History of seizures as a child 07/02/2016  . Hypokalemia 07/02/2016  . Acquired dysplasia of left hip 04/19/2016  . Acquired dysplasia of right hip 04/19/2016  . 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 Hypothyroidism 07/17/2015  . Disorder of pelvis 07/17/2015  . OP (osteoporosis) 07/17/2015  . Lumbar radicular pain 08/04/2013  . Lumbar scoliosis 08/04/2013    Beacher May PT 08/28/2017, 12:08 PM  [facility] PHYSICAL AND SPORTS MEDICINE [address], [place], [address] Phone: [phone]   Fax:  [phone]  Name: Christine Herring MRN: [id number] Date of Birth: 08-Jul-1986

## 2017-08-29 NOTE — Telephone Encounter (Signed)
Pt advised and will schedule an appointment.

## 2017-08-29 NOTE — Telephone Encounter (Signed)
Pt needs annual follow up w/ office visits and labs for hypothyroidism and her anemia.  I have placed orders for labs.  Pt should also schedule office visit within 7 days of labs to review and refill medications.

## 2017-09-01 ENCOUNTER — Ambulatory Visit: Payer: Managed Care, Other (non HMO) | Admitting: Physical Therapy

## 2017-09-03 ENCOUNTER — Other Ambulatory Visit: Payer: Managed Care, Other (non HMO)

## 2017-09-04 ENCOUNTER — Ambulatory Visit: Payer: Managed Care, Other (non HMO) | Admitting: Physical Therapy

## 2017-09-04 ENCOUNTER — Encounter: Payer: Self-pay | Admitting: Physical Therapy

## 2017-09-04 DIAGNOSIS — M6281 Muscle weakness (generalized): Secondary | ICD-10-CM

## 2017-09-04 DIAGNOSIS — M25551 Pain in right hip: Secondary | ICD-10-CM

## 2017-09-04 DIAGNOSIS — R262 Difficulty in walking, not elsewhere classified: Secondary | ICD-10-CM

## 2017-09-04 LAB — CBC WITH DIFFERENTIAL/PLATELET
Basophils Absolute: 39 cells/uL (ref 0–200)
Basophils Relative: 0.7 %
Eosinophils Absolute: 157 cells/uL (ref 15–500)
Eosinophils Relative: 2.8 %
HCT: 34.8 % — ABNORMAL LOW (ref 35.0–45.0)
Hemoglobin: 10.7 g/dL — ABNORMAL LOW (ref 11.7–15.5)
Lymphs Abs: 1786 cells/uL (ref 850–3900)
MCH: 23.8 pg — ABNORMAL LOW (ref 27.0–33.0)
MCHC: 30.7 g/dL — ABNORMAL LOW (ref 32.0–36.0)
MCV: 77.3 fL — ABNORMAL LOW (ref 80.0–100.0)
MPV: 10.4 fL (ref 7.5–12.5)
Monocytes Relative: 6.2 %
Neutro Abs: 3270 cells/uL (ref 1500–7800)
Neutrophils Relative %: 58.4 %
Platelets: 306 10*3/uL (ref 140–400)
RBC: 4.5 10*6/uL (ref 3.80–5.10)
RDW: 14.4 % (ref 11.0–15.0)
Total Lymphocyte: 31.9 %
WBC mixed population: 347 cells/uL (ref 200–950)
WBC: 5.6 10*3/uL (ref 3.8–10.8)

## 2017-09-04 LAB — IRON,TIBC AND FERRITIN PANEL
%SAT: 7 % (calc) — ABNORMAL LOW (ref 11–50)
Ferritin: 9 ng/mL — ABNORMAL LOW (ref 10–154)
Iron: 28 ug/dL — ABNORMAL LOW (ref 40–190)
TIBC: 395 mcg/dL (calc) (ref 250–450)

## 2017-09-04 LAB — TSH: TSH: 1.02 mIU/L

## 2017-09-04 NOTE — Therapy (Signed)
[place] [facility]IC[facility] [address], [place], [address] Phone: [phone]   Fax:  [phone]  Physical Therapy Treatment  Patient Details  Name: Christine Herring MRN: [id number] Date of Birth: 10-Aug-1986 Referring Provider: Durward Mallard MD  Encounter Date: 09/04/2017      PT End of Session - 09/04/17 1215    Visit Number 9   Number of Visits 16   Date for PT Re-Evaluation 09/24/17   PT Start Time 1120   PT Stop Time 1210   PT Time Calculation (min) 50 min   Equipment Utilized During Treatment Other (comment)   Activity Tolerance Patient tolerated treatment well   Behavior During Therapy [facility] for tasks assessed/performed      Past Medical History:  Diagnosis Date  . Anxiety   . Hip dysplasia   . Osteoporosis   . Seizures (HCC)    when she was a child reason not know  . Thyroid disease     Past Surgical History:  Procedure Laterality Date  . TOTAL HIP ARTHROPLASTY Right   . TUBAL LIGATION      There were no vitals filed for this visit.      Subjective Assessment - 09/04/17 1126    Subjective Patient reports increased soreness in right hip with increased activity.    Pertinent History hx of B hip displasia, B hip labral reconstruction, osteroporosis, leg length discrepancy,  L THR 2017 and most recently right THR with abductor repair 07/07/2017   Limitations Sitting;Standing;Walking   Patient Stated Goals to get back to normal in order to be able to perform daily tasks difficulty   Currently in Pain? No/denies        Objective: Gait; ambulating without brace on right hip; bilateral axillary crutches, limp on right with knee knee in flexion throughout gait cycle Palpation: tender along antero lateral hip right LE with decreased soft tissue mobility  Treatment: Therapeutic exercise: patient performed exercises with verbal, tactile cues and demonstration of therapist: goals: strength, independent  with home program, improve LEFS  Sitting at OMEGA: core strengthening: with assist of therapist for positioning and intensity palloff press to abdomen 1 x 15 reps 15# Straight arm pull downs  x 20reps 15# Reverse chin ups 15# 20 x  Single arm row 10# 15x Bilateral scapular rows 15# 15x  Supine lying: performed with assist of therapist, right LE: AAROM right hip abduction/adduction 2 x 5 reps  Sitting at edge of treatment table: Knee extension x 15 Ankle PF off balance stones with 6# on thighs x 20 reps Hip adduction with ball and glute sets x 10  NuStep: pre exercise; independent; unbilled time: 10 min. Level #4 intensity, seat #9; required assistance and minimal VC to perform with proper knee alignment and avoid IR of hip/LE  Manual therapy:51min.  STM performed along scar right hip with patient supine lying with knee supported by pillow; superficial techniques to improve soft tissue, scar mobility  Patient response to treatment: Patient demonstrated good technique and alignment with exercises with minimal assistance and cuing. No increased pain reported throughout session. Improved soft tissue elasticity along lateral thigh right LE following STM.         PT Education - 09/04/17 1200    Education provided Yes   Education Details exercise instruction   Person(s) Educated Patient   Methods Explanation;Verbal cues   Comprehension Verbal cues required;Verbalized understanding  PT Long Term Goals - 07/30/17 1310      PT LONG TERM GOAL #1   Title Pt will improve LEFS score to 30/80  demonstrating improved functional mobility    Baseline LEFS 9/80   Status New   Target Date 09/04/17     PT LONG TERM GOAL #2   Title Pt will improve LEFS score to 40/80  demonstrating improved functional mobility    Baseline LEFS 9/80   Status New   Target Date 09/24/17     PT LONG TERM GOAL #3   Title Pt will score >1.0 m/s without AD on 10 MW test for improved  community mobility     Baseline ambulating with bilateral axillary crutches, slow cadence   Status New   Target Date 09/24/17     PT LONG TERM GOAL #4   Title Pt. will be independent with home program for exercises for strength, endurance, ROM weekly through discharge to allow self management once discharged from physical therapy   Baseline requires guidance, instruction and assistance for exercises   Status New   Target Date 09/24/17               Plan - 09/04/17 1213    Clinical Impression Statement Patient is progressing with goals and continues to requie moderate assistance of therapist for LE exercises due to decreased strength right LE. She will require additional physicla therapy intervention to improve funciton anin order to return to full function.    Rehab Potential Good   Clinical Impairments Affecting Rehab Potential (+) age, motivated, acute condition of recent right hip surgery  (-) hx of hip displasia, multiple surgeries both hips   PT Frequency 2x / week   PT Duration 8 weeks   PT Treatment/Interventions Gait training;Therapeutic exercise;Balance training;Neuromuscular re-education;Patient/family education;Manual techniques;Passive range of motion;Electrical Stimulation;Moist Heat   PT Next Visit Plan therapeutic exercises for strength, balance, endurance   PT Home Exercise Plan core strengthening, gluteal setting, quad setting, ankle pumps      Patient will benefit from skilled therapeutic intervention in order to improve the following deficits and impairments:  Abnormal gait, Decreased strength, Difficulty walking, Impaired flexibility, Impaired sensation, Pain, Increased muscle spasms, Impaired perceived functional ability, Decreased activity tolerance, Decreased endurance, Decreased range of motion  Visit Diagnosis: Muscle weakness (generalized)  Difficulty in walking, not elsewhere classified  Pain in right hip     Problem List Patient Active Problem  List   Diagnosis Date Noted  . Discharge from left nipple 02/12/2017  . Pain of left breast 02/12/2017  . Femoral anteversion of right lower extremity 01/03/2017  . Femoroacetabular impingement of right hip 01/03/2017  . Iron deficiency anemia 09/17/2016  . Status post total replacement of left hip 07/11/2016  . Chronic hypotension 07/02/2016  . Collapse of lung 07/02/2016  . H/O hepatitis 07/02/2016  . History of positive PPD 07/02/2016  . History of seizures as a child 07/02/2016  . Hypokalemia 07/02/2016  . Acquired dysplasia of left hip 04/19/2016  . Acquired dysplasia of right hip 04/19/2016  . 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 Hypothyroidism 07/17/2015  . Disorder of pelvis 07/17/2015  . OP (osteoporosis) 07/17/2015  . Lumbar radicular pain 08/04/2013  . Lumbar scoliosis 08/04/2013    Beacher MayBrooks, Marie PT 09/05/2017, 9:34 AM   [facility] [address] [place], [place], [address] Phone: [phone]   Fax:  [phone]  Name: Christine Herring MRN: [id number] Date  of Birth: 1986-08-11

## 2017-09-08 ENCOUNTER — Ambulatory Visit: Payer: Managed Care, Other (non HMO) | Admitting: Physical Therapy

## 2017-09-08 ENCOUNTER — Encounter: Payer: Self-pay | Admitting: Physical Therapy

## 2017-09-08 DIAGNOSIS — R262 Difficulty in walking, not elsewhere classified: Secondary | ICD-10-CM

## 2017-09-08 DIAGNOSIS — M6281 Muscle weakness (generalized): Secondary | ICD-10-CM | POA: Diagnosis not present

## 2017-09-08 DIAGNOSIS — M25551 Pain in right hip: Secondary | ICD-10-CM

## 2017-09-08 NOTE — Therapy (Signed)
[facility] PHYS[facility]ORTS MEDICINE [address] [place], [address] Phone: [phone]   Fax:  [phone]  Physical Therapy Treatment  Patient Details  Name: Christine Herring MRN: [id number] Date of Birth: 1986/11/28 Referring Provider: Park Meo MD  Encounter Date: 09/08/2017      PT End of Session - 09/08/17 1000    Visit Number 10   Number of Visits 16   Date for PT Re-Evaluation 09/24/17   PT Start Time 0958   PT Stop Time 1039   PT Time Calculation (min) 41 min   Equipment Utilized During Treatment Other (comment)   Activity Tolerance Patient tolerated treatment well   Behavior During Therapy [facility] for tasks assessed/performed      Past Medical History:  Diagnosis Date  . Anxiety   . Hip dysplasia   . Osteoporosis   . Seizures (HCC)    when she was a child reason not know  . Thyroid disease     Past Surgical History:  Procedure Laterality Date  . TOTAL HIP ARTHROPLASTY Right   . TUBAL LIGATION      There were no vitals filed for this visit.      Subjective Assessment - 09/08/17 0959    Subjective Patient reports doing well with right hip today. She is feeling mild discomfort due to the bad weather.    Pertinent History hx of B hip displasia, B hip labral reconstruction, osteroporosis, leg length discrepancy,  L THR 2017 and most recently right THR with abductor repair 07/07/2017   Limitations Sitting;Standing;Walking   Patient Stated Goals to get back to normal in order to be able to perform daily tasks difficulty   Currently in Pain? No/denies      Objective: Gait; ambulating withoutbrace on right hip; bilateral axillary crutches, limp on right with knee knee in flexion throughout gait cycle Palpation: tender along antero lateral hip right LE with decreased soft tissue mobility  Treatment: Therapeutic exercise: patient performed exercises with verbal, tactile cues and demonstration of therapist:  goals: strength, independent with home program, improve LEFS  Sitting at [place]: core strengthening: with assist of therapist for positioning and intensity palloff press to abdomen 1x 15 reps 15# Straight arm pull downs  x 20reps 15# Reverse chin ups 15# 20 x 2 sets Bilateral scapular rows 15# 15x  Sitting at edge of treatment table: Knee extension 2 x 15 with 3# ankle weight Ankle PF off balance stones with 8# on thighs 2 x 20 reps Hip adduction with ball and glute sets x 10 Standing side stepping along airex balance beam x 3 sets (3 steps each direction) Standing lateral step up with opposite hip flexion, tap beam and then shift weight off to side; 5x each LE  NuStep: pre exercise; independent; unbilled time: . Level #4 intensity, seat #9  Patient response to treatment: Patient demonstrated good technique and alignment with exercises with minimal to moderate assistance and cuing. No increased pain reported throughout session.           PT Education - 09/08/17 1000    Education provided Yes   Education Details exercise instruction    Person(s) Educated Patient   Methods Explanation;Verbal cues   Comprehension Verbalized understanding;Verbal cues required             PT Long Term Goals - 07/30/17 1310      PT LONG TERM GOAL #1   Title Pt will improve LEFS score to 30/80  demonstrating improved functional mobility    Baseline LEFS 9/80   Status New   Target Date 09/04/17     PT LONG TERM GOAL #2   Title Pt will improve LEFS score to 40/80  demonstrating improved functional mobility    Baseline LEFS 9/80   Status New   Target Date 09/24/17     PT LONG TERM GOAL #3   Title Pt will score >1.0 m/s without AD on 10 MW test for improved community mobility     Baseline ambulating with bilateral axillary crutches, slow cadence   Status New   Target Date 09/24/17     PT LONG TERM GOAL #4   Title Pt. will be independent with home program for exercises  for strength, endurance, ROM weekly through discharge to allow self management once discharged from physical therapy   Baseline requires guidance, instruction and assistance for exercises   Status New   Target Date 09/24/17               Plan - 09/08/17 1031    Clinical Impression Statement Patient is progressing with exercises. Session limited due to patient arriving late after having to take children to school on a 2 hour delay. She is exercising with moderate cuing and instruction due to surgery and precautions. She should continue to improve as she heals and continues with physical therapy intervention.    Rehab Potential Good   Clinical Impairments Affecting Rehab Potential (+) age, motivated, acute condition of recent right hip surgery  (-) hx of hip displasia, multiple surgeries both hips   PT Frequency 2x / week   PT Duration 8 weeks   PT Treatment/Interventions Gait training;Therapeutic exercise;Balance training;Neuromuscular re-education;Patient/family education;Manual techniques;Passive range of motion;Electrical Stimulation;Moist Heat   PT Next Visit Plan therapeutic exercises for strength, balance, endurance   PT Home Exercise Plan core strengthening, gluteal setting, quad setting, ankle pumps      Patient will benefit from skilled therapeutic intervention in order to improve the following deficits and impairments:  Abnormal gait, Decreased strength, Difficulty walking, Impaired flexibility, Impaired sensation, Pain, Increased muscle spasms, Impaired perceived functional ability, Decreased activity tolerance, Decreased endurance, Decreased range of motion  Visit Diagnosis: Muscle weakness (generalized)  Difficulty in walking, not elsewhere classified  Pain in right hip     Problem List Patient Active Problem List   Diagnosis Date Noted  . Discharge from left nipple 02/12/2017  . Pain of left breast 02/12/2017  . Femoral anteversion of right lower extremity  01/03/2017  . Femoroacetabular impingement of right hip 01/03/2017  . Iron deficiency anemia 09/17/2016  . Status post total replacement of left hip 07/11/2016  . Chronic hypotension 07/02/2016  . Collapse of lung 07/02/2016  . H/O hepatitis 07/02/2016  . History of positive PPD 07/02/2016  . History of seizures as a child 07/02/2016  . Hypokalemia 07/02/2016  . Acquired dysplasia of left hip 04/19/2016  . Acquired dysplasia of right hip 04/19/2016  . 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 Hypothyroidism 07/17/2015  . Disorder of pelvis 07/17/2015  . OP (osteoporosis) 07/17/2015  . Lumbar radicular pain 08/04/2013  . Lumbar scoliosis 08/04/2013    Beacher May PT 09/08/2017, 12:39 PM  [facility] PHYSICAL AND SPORTS MEDICINE [address] [place], [address] Phone: [phone]   Fax:  [phone]  Name: Christine Herring MRN: [id number] Date of Birth: September 19, 1986

## 2017-09-09 ENCOUNTER — Ambulatory Visit (INDEPENDENT_AMBULATORY_CARE_PROVIDER_SITE_OTHER): Payer: Managed Care, Other (non HMO) | Admitting: Nurse Practitioner

## 2017-09-09 ENCOUNTER — Encounter: Payer: Self-pay | Admitting: Nurse Practitioner

## 2017-09-09 VITALS — BP 91/62 | HR 67 | Temp 98.3°F | Ht 65.0 in | Wt 164.0 lb

## 2017-09-09 DIAGNOSIS — Z1321 Encounter for screening for nutritional disorder: Secondary | ICD-10-CM | POA: Diagnosis not present

## 2017-09-09 DIAGNOSIS — Z09 Encounter for follow-up examination after completed treatment for conditions other than malignant neoplasm: Secondary | ICD-10-CM | POA: Diagnosis not present

## 2017-09-09 DIAGNOSIS — M899 Disorder of bone, unspecified: Secondary | ICD-10-CM

## 2017-09-09 DIAGNOSIS — D508 Other iron deficiency anemias: Secondary | ICD-10-CM

## 2017-09-09 DIAGNOSIS — E034 Atrophy of thyroid (acquired): Secondary | ICD-10-CM

## 2017-09-09 DIAGNOSIS — R5383 Other fatigue: Secondary | ICD-10-CM | POA: Diagnosis not present

## 2017-09-09 DIAGNOSIS — Z0000 Encounter for general adult medical examination without abnormal findings: Secondary | ICD-10-CM

## 2017-09-09 MED ORDER — LEVOTHYROXINE SODIUM 100 MCG PO TABS: 100.0000 ug | ORAL_TABLET | Freq: Every day | ORAL | 3 refills | Status: DC

## 2017-09-09 NOTE — Assessment & Plan Note (Addendum)
Stable TSH at last check despite fatigue. Pt taking w/ other medications, but is waiting for meals.   Plan: 1. Continue levothyroxine at 100 mcg once daily. 2. Reviewed appropriate dosing.  Take only w/ water.  Wait 30 mins-1 hour before other meds, drink, or food.  Wait 2-4 hours before supplements including calcium, iron, phosphorous. 3. Follow up 6 months-1 year.

## 2017-09-09 NOTE — Patient Instructions (Addendum)
Christine Herring, Thank you for coming in to clinic today.  1. For your iron deficiency - START ferrous sulfate 325 mg tablets 2-3 tablets every other day for the next 4 weeks. - Recheck labs in 3 weeks on 09/29/17  2. NO new findings on your physical exam.  3. Thyroid: - normal function with your current dose of levothyroxine.  Continue taking 100 mcg once daily 30 minutes before eating or drinking anything other than water.    4. For your cough if returning to your same job: - If comes back, let me know so we can try montelukast.  Please schedule a follow-up appointment with Wilhelmina Mcardle, AGNP. Return in about 1 year (around 09/09/2018) for annual physical.  If you have any other questions or concerns, please feel free to call the clinic or send a message through MyChart. You may also schedule an earlier appointment if necessary.  You will receive a survey after today's visit either digitally by e-mail or paper by [employer]. Your experiences and feedback matter to [place].  Please respond so we know how we are doing as we provide care for you.   Wilhelmina Mcardle, DNP, AGNP-BC Adult Gerontology Nurse Practitioner [facility], [facility]

## 2017-09-09 NOTE — Progress Notes (Signed)
Subjective:    Patient ID: Christine Herring, female    DOB: [birth year], 31 y.o.   MRN: [id number]  Christine Herring is a 31 y.o. female presenting on 09/09/2017 for Annual Exam   HPI Fatigue Surgery in July for hip replacement: - Hgb on discharge at 9.4 after 4 units PRBC 2/2 blood loss during surgery w/ hypotension  Repeat Hgb on 9/12 was 10.7 mg/dL Has not yet started iron pills -  Has needed them in past - had nausea.  Feels tired and has bad headaches now.  Gets short of breath w/ PT.   Is already taking multivitamin and vitamin D and calcium.   Constant cough at work: Has disappeared.  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    Hypothyroidism - Pt states she is taking her levothyroxine 100 mcg in the am at least 1 hour before eating or drinking and taking other medicines.   - She is not currently symptomatic. - She denies, fatigue, excess energy, weight changes, heart racing, heart palpitations, heat and cold intolerance, changes in hair/skin/nails, and lower leg swelling.  - She does not have any compressive symptoms to include difficulty swallowing, globus sensation, or difficulty breathing when lying flat.   Annual Physical Exam Patient has been feeling better since surgery w/ lower pain level. Sleeps 7-8 hours per night uninterrupted.  HEALTH MAINTENANCE: Weight/BMI: stable Physical activity: slower, but working to increase Diet: avoiding fried foods, lean meats & veggies Seatbelt: always Sunscreen: never - prolonged periods PAP: Last year and negative HIV: declined.  Has had neg HIV 5 years ago w/ pregnancy  Optometry: 1 year ago Dentistry: 1 year ago   VACCINES: Tetanus: 2013 Influenza: due today - consents  Past Medical History:  Diagnosis Date  . Anxiety   . Hip dysplasia   . Osteoporosis   . Seizures (HCC)    when she was a child reason not know  . Thyroid disease    Past Surgical History:  Procedure Laterality Date  . TOTAL HIP  ARTHROPLASTY Right   . TUBAL LIGATION     Social History   Social History  . Marital status: Single    Spouse name: N/A  . Number of children: N/A  . Years of education: N/A   Occupational History  . Not on file.   Social History Main Topics  . Smoking status: Former Smoker    Packs/day: 0.50    Years: 10.00    Quit date: 07/16/2005  . Smokeless tobacco: Never Used  . Alcohol use Yes     Comment: social  . Drug use: No  . Sexual activity: Yes    Birth control/ protection: Surgical   Other Topics Concern  . Not on file   Social History Narrative  . No narrative on file   Family History  Problem Relation Age of Onset  . Thyroid disease Mother   . Breast cancer Maternal Aunt   . Colon cancer Maternal Aunt        grt   . Ovarian cancer Paternal Grandmother   . Diabetes Neg Hx   . Heart disease Neg Hx    Current Outpatient Prescriptions on File Prior to Visit  Medication Sig  . albuterol (PROVENTIL HFA;VENTOLIN HFA) 108 (90 Base) MCG/ACT inhaler Inhale 2 puffs into the lungs every 6 (six) hours as needed for wheezing or shortness of breath.  . Calcium Carb-Cholecalciferol 500-200 MG-UNIT TABS Take 2 tablets by mouth daily.  Marland Kitchen levothyroxine (  SYNTHROID, LEVOTHROID) 100 MCG tablet Take 1 tablet (100 mcg total) by mouth daily.   No current facility-administered medications on file prior to visit.     Review of Systems  Constitutional: Positive for fatigue.  Musculoskeletal: Positive for arthralgias.       Hip surgery in July  Neurological: Positive for headaches.  Psychiatric/Behavioral: Positive for decreased concentration.   Per HPI unless specifically indicated above     Objective:    BP 91/62 (BP Location: Right Arm, Patient Position: Sitting, Cuff Size: Normal)   Pulse 67   Temp 98.3 F (36.8 C) (Oral)   Ht  (1.651 m)   Wt 164 lb (74.4 kg)   BMI 27.29 kg/m   Wt Readings from Last 3 Encounters:  09/09/17 164 lb (74.4 kg)  05/01/17 164 lb 6.4 oz  (74.6 kg)  02/11/17 166 lb 1.6 oz (75.3 kg)    Physical Exam  General - healthy, well-appearing, NAD HEENT - Normocephalic, atraumatic, PERRL, EOMI, patent nares w/o congestion, oropharynx clear, MMM Neck - supple, non-tender, no LAD, no thyromegaly Heart - RRR, no murmurs heard Lungs - Clear throughout all lobes, no wheezing, crackles, or rhonchi. Normal work of breathing. Abdomen - soft, NTND, no masses, no hepatosplenomegaly, active bowel sounds Extremeties - non-tender, no edema, cap refill < 2 seconds, peripheral pulses intact +2 bilaterally Skin - warm, dry, no rashes Neuro - awake, alert, oriented x3, CN II-X intact, intact muscle strength 5/5 bilaterally, intact distal sensation to light touch, normal coordination, antalgic gait using crutches weight bearing BLE Psych - Normal mood and affect, normal behavior    Results for orders placed or performed in visit on 08/28/17  TSH  Result Value Ref Range   TSH 1.02 mIU/L  CBC with Differential/Platelet  Result Value Ref Range   WBC 5.6 3.8 - 10.8 Thousand/uL   RBC 4.50 3.80 - 5.10 Million/uL   Hemoglobin 10.7 (L) 11.7 - 15.5 g/dL   HCT 16.1 (L) 09.6 - 04.5 %   MCV 77.3 (L) 80.0 - 100.0 fL   MCH 23.8 (L) 27.0 - 33.0 pg   MCHC 30.7 (L) 32.0 - 36.0 g/dL   RDW 40.9 81.1 - 91.4 %   Platelets 306 140 - 400 Thousand/uL   MPV 10.4 7.5 - 12.5 fL   Neutro Abs 3,270 1,500 - 7,800 cells/uL   Lymphs Abs 1,786 850 - 3,900 cells/uL   WBC mixed population 347 200 - 950 cells/uL   Eosinophils Absolute 157 15 - 500 cells/uL   Basophils Absolute 39 0 - 200 cells/uL   Neutrophils Relative % 58.4 %   Total Lymphocyte 31.9 %   Monocytes Relative 6.2 %   Eosinophils Relative 2.8 %   Basophils Relative 0.7 %  Iron, TIBC and Ferritin Panel  Result Value Ref Range   Iron 28 (L) 40 - 190 mcg/dL   TIBC 782 956 - 213 mcg/dL (calc)   %SAT 7 (L) 11 - 50 % (calc)   Ferritin 9 (L) 10 - 154 ng/mL      Assessment & Plan:   Problem List Items  Addressed This Visit      Endocrine   Hypothyroidism    Stable TSH at last check despite fatigue. Pt taking w/ other medications, but is waiting for meals.   Plan: 1. Continue levothyroxine at 100 mcg once daily. 2. Reviewed appropriate dosing.  Take only w/ water.  Wait 30 mins-1 hour before other meds, drink, or food.  Wait 2-4 hours  before supplements including calcium, iron, phosphorous. 3. Follow up 6 months-1 year.      Relevant Medications   levothyroxine (SYNTHROID, LEVOTHROID) 100 MCG tablet     Other   Iron deficiency anemia    Pt w/ complications during hip replacement w/ signficiant blood loss requiring transfusion of 4u PRBC during hospitalization.  Pt has had repeat CBC indicating continued microcytic, hypochromic anemia.  Pt on multivitamin. Pt has had hx of iron deficiency anemia in past.  Most recent CBC w/ hemoglobin improved over hospital discharge.  Plan: 1. Start ferrous sulfate  tablet.  Take 2-3 tablets w/ lunch every other day. 2. Recheck CBC w/ iron studies in about 3 weeks. 3. Follow up as needed.      Relevant Orders   CBC with Differential/Platelet   Iron, TIBC and Ferritin Panel   Fatigue    Likely related to anemia and rehab for hip.    Plan: 1. Continue monitoring CBC. 2. Encouraged pt to continue rehab - is long process and can make her fatigued.       Other Visit Diagnoses    Annual physical exam    -  Primary Physical exam with no new findings.  Well adult recovering from surgery.  Plan: 1. Obtain health maintenance screenings. 2. Return 1 year for annual physical.   Relevant Orders   Lipid panel (Completed)   Comprehensive metabolic panel (Completed)   Hemoglobin A1c (Completed)   VITAMIN D 25 Hydroxy (Vit-D Deficiency, Fractures) (Completed)   Encounter for vitamin deficiency screening       Bone disease     Screening for vitamin D - evaluate adequate nutrition for healing bone structure after surgery.    Hospital  discharge follow-up       Pt s/p right hip replacement.  Intraoperative complications w/ blood loss.  See AP for anemia.  Otherwise rehab is progressing normally.  Continue F/U w/ ortho.      Meds ordered this encounter  Medications  . Multiple Vitamin (MULTIVITAMIN) tablet    Sig: Take 1 tablet by mouth daily.     Follow up plan: Return in about 1 year (around 09/09/2018) for annual physical.  Wilhelmina Mcardle, DNP, AGPCNP-BC Adult Gerontology Primary Care Nurse Practitioner [facility] 09/09/2017, 9:21 AM

## 2017-09-10 LAB — COMPREHENSIVE METABOLIC PANEL
AG Ratio: 1.3 (calc) (ref 1.0–2.5)
ALT: 10 U/L (ref 6–29)
AST: 13 U/L (ref 10–30)
Albumin: 4.1 g/dL (ref 3.6–5.1)
Alkaline phosphatase (APISO): 91 U/L (ref 33–115)
BUN: 10 mg/dL (ref 7–25)
CO2: 24 mmol/L (ref 20–32)
Calcium: 9 mg/dL (ref 8.6–10.2)
Chloride: 105 mmol/L (ref 98–110)
Creat: 0.66 mg/dL (ref 0.50–1.10)
Globulin: 3.2 g/dL (calc) (ref 1.9–3.7)
Glucose, Bld: 77 mg/dL (ref 65–99)
Potassium: 4 mmol/L (ref 3.5–5.3)
Sodium: 137 mmol/L (ref 135–146)
Total Bilirubin: 0.4 mg/dL (ref 0.2–1.2)
Total Protein: 7.3 g/dL (ref 6.1–8.1)

## 2017-09-10 LAB — HEMOGLOBIN A1C
Hgb A1c MFr Bld: 4.9 %{Hb}
Mean Plasma Glucose: 94 (calc)
eAG (mmol/L): 5.2 (calc)

## 2017-09-10 LAB — VITAMIN D 25 HYDROXY (VIT D DEFICIENCY, FRACTURES): Vit D, 25-Hydroxy: 29 ng/mL — ABNORMAL LOW (ref 30–100)

## 2017-09-10 LAB — LIPID PANEL
Cholesterol: 163 mg/dL (ref <200)
HDL: 33 mg/dL — ABNORMAL LOW (ref >50)
LDL Cholesterol (Calc): 104 mg/dL (calc) — ABNORMAL HIGH
Non-HDL Cholesterol (Calc): 130 mg/dL (calc) — ABNORMAL HIGH (ref <130)
Total CHOL/HDL Ratio: 4.9 (calc) (ref <5.0)
Triglycerides: 148 mg/dL (ref <150)

## 2017-09-11 ENCOUNTER — Ambulatory Visit: Payer: Managed Care, Other (non HMO) | Admitting: Physical Therapy

## 2017-09-12 NOTE — Assessment & Plan Note (Addendum)
Pt w/ complications during hip replacement w/ signficiant blood loss requiring transfusion of 4u PRBC during hospitalization.  Pt has had repeat CBC indicating continued microcytic, hypochromic anemia.  Pt on multivitamin. Pt has had hx of iron deficiency anemia in past.  Most recent CBC w/ hemoglobin improved over hospital discharge.  Plan: 1. Start ferrous sulfate  tablet.  Take 2-3 tablets w/ lunch every other day. 2. Recheck CBC w/ iron studies in about 3 weeks. 3. Follow up as needed.

## 2017-09-12 NOTE — Assessment & Plan Note (Signed)
Likely related to anemia and rehab for hip.    Plan: 1. Continue monitoring CBC. 2. Encouraged pt to continue rehab - is long process and can make her fatigued.

## 2017-09-15 ENCOUNTER — Ambulatory Visit: Payer: Managed Care, Other (non HMO) | Admitting: Physical Therapy

## 2017-09-15 ENCOUNTER — Encounter: Payer: Self-pay | Admitting: Physical Therapy

## 2017-09-15 DIAGNOSIS — M6281 Muscle weakness (generalized): Secondary | ICD-10-CM | POA: Diagnosis not present

## 2017-09-15 DIAGNOSIS — M25551 Pain in right hip: Secondary | ICD-10-CM

## 2017-09-15 DIAGNOSIS — R2689 Other abnormalities of gait and mobility: Secondary | ICD-10-CM

## 2017-09-15 DIAGNOSIS — R262 Difficulty in walking, not elsewhere classified: Secondary | ICD-10-CM

## 2017-09-15 NOTE — Therapy (Signed)
[facility]OR[facility] MEDICINE [address] [place], [address] Phone: [phone]   Fax:  [phone]  Physical Therapy Treatment  Patient Details  Name: Christine Herring MRN: [id number] Date of Birth: 1986/05/23 Referring Provider: Park Meo MD  Encounter Date: 09/15/2017      PT End of Session - 09/15/17 1048    Visit Number 11   Number of Visits 16   Date for PT Re-Evaluation 09/24/17   PT Start Time 1043   PT Stop Time 1130   PT Time Calculation (min) 47 min   Equipment Utilized During Treatment --   Activity Tolerance Patient tolerated treatment well   Behavior During Therapy [facility] for tasks assessed/performed      Past Medical History:  Diagnosis Date  . Anxiety   . Hip dysplasia   . Osteoporosis   . Seizures (HCC)    when she was a child reason not know  . Thyroid disease     Past Surgical History:  Procedure Laterality Date  . TOTAL HIP ARTHROPLASTY Right   . TUBAL LIGATION      There were no vitals filed for this visit.      Subjective Assessment - 09/15/17 1044    Subjective Patient reports she is seeing improvement with right LE with strength and ability to walk. She is still having some fear of pain in right hip however is not having any at the moment.    Pertinent History hx of B hip displasia, B hip labral reconstruction, osteroporosis, leg length discrepancy,  L THR 2017 and most recently right THR with abductor repair 07/07/2017   Limitations Sitting;Standing;Walking   Patient Stated Goals to get back to normal in order to be able to perform daily tasks difficulty   Currently in Pain? No/denies      Objective: Posture: pelvis more aligned with decreased hiking of right hip noted and less knee flexion on right with standing and ambulation Strength: decreased hip flexion, abduction right and left LE, not formally assessed due to pre cations of hip abductor repair right  LE  Treatment: Seated:  Hip adduction with ball and glute sets x 15 reps  Standing:  airex balance beam x 2 min. Side stepping right/left with controlled motion Standing hip flexion, tap beam and return to floor x 5 reps each LE with fatigue noted left hip>right with hip flexion  Leg press: 25# bialteral leg press with mild to moderate effort x 25 reps  Instructed in use of hip rotary machine once discharged to independence at local fitness facility Discussed use of recumbent bike and glider for endurance   NuStep independent x 10 min. Level #4 intensity (unbilled time)  Patient response to treatment: patient demonstrated improved technique with exercises with minimal VC for correct alignment. Improved motor control with repetition and cuing and repeated demonstration         PT Education - 09/15/17 1045    Education provided Yes   Education Details exercise instruction; leg press, hip rotary machine, balance, proprioception exercise on airex balance beam   Person(s) Educated Patient   Methods Explanation;Verbal cues   Comprehension Verbalized understanding;Returned demonstration;Verbal cues required             PT Long Term Goals - 07/30/17 1310      PT LONG TERM GOAL #1   Title Pt will improve LEFS score to 30/80  demonstrating improved functional mobility    Baseline LEFS 9/80  Status New   Target Date 09/04/17     PT LONG TERM GOAL #2   Title Pt will improve LEFS score to 40/80  demonstrating improved functional mobility    Baseline LEFS 9/80   Status New   Target Date 09/24/17     PT LONG TERM GOAL #3   Title Pt will score >1.0 m/s without AD on 10 MW test for improved community mobility     Baseline ambulating with bilateral axillary crutches, slow cadence   Status New   Target Date 09/24/17     PT LONG TERM GOAL #4   Title Pt. will be independent with home program for exercises for strength, endurance, ROM weekly through discharge to allow self  management once discharged from physical therapy   Baseline requires guidance, instruction and assistance for exercises   Status New   Target Date 09/24/17               Plan - 09/15/17 1145    Clinical Impression Statement Patient is progressing well with improving endurance and strength as demonstrated with advancing exercises. she continues with significant weakness and decreased endurnace and sh heals from hip surgery and will benefit from continued physical therapy intervention.    Rehab Potential Good   Clinical Impairments Affecting Rehab Potential (+) age, motivated, acute condition of recent right hip surgery  (-) hx of hip displasia, multiple surgeries both hips   PT Frequency 2x / week   PT Duration 8 weeks   PT Treatment/Interventions Gait training;Therapeutic exercise;Balance training;Neuromuscular re-education;Patient/family education;Manual techniques;Passive range of motion;Electrical Stimulation;Moist Heat   PT Next Visit Plan therapeutic exercises for strength, balance, endurance   PT Home Exercise Plan core strengthening, gluteal setting, quad setting, ankle pumps      Patient will benefit from skilled therapeutic intervention in order to improve the following deficits and impairments:  Abnormal gait, Decreased strength, Difficulty walking, Impaired flexibility, Impaired sensation, Pain, Increased muscle spasms, Impaired perceived functional ability, Decreased activity tolerance, Decreased endurance, Decreased range of motion  Visit Diagnosis: Muscle weakness (generalized)  Difficulty in walking, not elsewhere classified  Other abnormalities of gait and mobility  Pain in right hip     Problem List Patient Active Problem List   Diagnosis Date Noted  . Fatigue 09/09/2017  . Status post total replacement of right hip 07/07/2017  . 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 Iron deficiency anemia 09/17/2016  . Status post total  replacement of left hip 07/11/2016  . Chronic hypotension 07/02/2016  . Collapse of lung 07/02/2016  . H/O hepatitis 07/02/2016  . History of positive PPD 07/02/2016  . History of seizures as a child 07/02/2016  . Hypokalemia 07/02/2016  . Acquired dysplasia of left hip 04/19/2016  . Acquired dysplasia of right hip 04/19/2016  . 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 Hypothyroidism 07/17/2015  . OP (osteoporosis) 07/17/2015  . Lumbar radicular pain 08/04/2013  . Lumbar scoliosis 08/04/2013    Beacher May PT 09/15/2017, 1:42 PM  [facility] PHYSICAL AND SPORTS MEDICINE [address] [place], [address] Phone: [phone]   Fax:  [phone]  Name: Barbarajean Kinzler MRN: [id number] Date of Birth: 1986-07-31

## 2017-09-18 ENCOUNTER — Ambulatory Visit: Payer: Managed Care, Other (non HMO) | Admitting: Physical Therapy

## 2017-09-18 ENCOUNTER — Encounter: Payer: Self-pay | Admitting: Physical Therapy

## 2017-09-18 DIAGNOSIS — M6281 Muscle weakness (generalized): Secondary | ICD-10-CM

## 2017-09-18 DIAGNOSIS — M25551 Pain in right hip: Secondary | ICD-10-CM

## 2017-09-18 DIAGNOSIS — R262 Difficulty in walking, not elsewhere classified: Secondary | ICD-10-CM

## 2017-09-18 DIAGNOSIS — R2689 Other abnormalities of gait and mobility: Secondary | ICD-10-CM

## 2017-09-18 NOTE — Therapy (Signed)
[place] [facility] [address] [place], [address] Phone: [phone]   Fax:  [phone]  Physical Therapy Treatment  Patient Details  Name: Christine Herring MRN: [id number] Date of Birth: 13-Jan-1986 Referring Provider: Park Meo MD  Encounter Date: 09/18/2017      PT End of Session - 09/18/17 1359    Visit Number 12   Number of Visits 16   Date for PT Re-Evaluation 09/24/17   PT Start Time 1310   PT Stop Time 1350   PT Time Calculation (min) 40 min   Activity Tolerance Patient tolerated treatment well   Behavior During Therapy [facility] for tasks assessed/performed      Past Medical History:  Diagnosis Date  . Anxiety   . Hip dysplasia   . Osteoporosis   . Seizures (HCC)    when she was a child reason not know  . Thyroid disease     Past Surgical History:  Procedure Laterality Date  . TOTAL HIP ARTHROPLASTY Right   . TUBAL LIGATION      There were no vitals filed for this visit.      Subjective Assessment - 09/18/17 1311    Subjective Patient reports she is seeing improvement with right LE with strength and ability to walk.    Pertinent History hx of B hip displasia, B hip labral reconstruction, osteroporosis, leg length discrepancy,  L THR 2017 and most recently right THR with abductor repair 07/07/2017   Limitations Sitting;Standing;Walking   Patient Stated Goals to get back to normal in order to be able to perform daily tasks difficulty   Currently in Pain? No/denies        Objective: Gait: right hip higher than left today, ambulating with one crutch WBAT right LE  Treatment: Seated:  Hip adduction with ball and glute sets x 15 reps  Standing:  airex balance beam x 2 min. Side stepping right/left with controlled motion Standing hip flexion, tap beam and return to floor x 5 reps each LE with fatigue noted left hip>right with hip flexion  Leg press: 25# bialteral leg press with  mild to moderate effort x 25 reps  Balance system:  Weight shifting forward and back x 2 min. Side to side x 2 min. Limits of stability each skill level 2 reps  NuStep independent x 10 min. Level #4 intensity (unbilled time)  Patient response to treatment: patient demonstrated improved technique with exercises with minimal VC for correct alignment. Improved motor control with repetition and cuing and repeated demonstration        PT Education - 09/18/17 1344    Education provided Yes   Education Details exercise instruction   Person(s) Educated Patient   Methods Explanation;Demonstration;Verbal cues   Comprehension Verbalized understanding;Returned demonstration;Verbal cues required             PT Long Term Goals - 07/30/17 1310      PT LONG TERM GOAL #1   Title Pt will improve LEFS score to 30/80  demonstrating improved functional mobility    Baseline LEFS 9/80   Status New   Target Date 09/04/17     PT LONG TERM GOAL #2   Title Pt will improve LEFS score to 40/80  demonstrating improved functional mobility    Baseline LEFS 9/80   Status New   Target Date 09/24/17     PT LONG TERM GOAL #3   Title Pt will score >1.0 m/s without AD on  10 MW test for improved community mobility     Baseline ambulating with bilateral axillary crutches, slow cadence   Status New   Target Date 09/24/17     PT LONG TERM GOAL #4   Title Pt. will be independent with home program for exercises for strength, endurance, ROM weekly through discharge to allow self management once discharged from physical therapy   Baseline requires guidance, instruction and assistance for exercises   Status New   Target Date 09/24/17               Plan - 09/18/17 1400    Clinical Impression Statement Patient is progressing well with improvement noted in strength and endurance. she continues with limitations with walking and daily tasks and will benefit form continued physical therapy intervention  to achieve goals.    Rehab Potential Good   Clinical Impairments Affecting Rehab Potential (+) age, motivated, acute condition of recent right hip surgery  (-) hx of hip displasia, multiple surgeries both hips   PT Frequency 2x / week   PT Duration 8 weeks   PT Treatment/Interventions Gait training;Therapeutic exercise;Balance training;Neuromuscular re-education;Patient/family education;Manual techniques;Passive range of motion;Electrical Stimulation;Moist Heat   PT Next Visit Plan therapeutic exercises for strength, balance, endurance   PT Home Exercise Plan core strengthening, gluteal setting, quad setting, ankle pumps      Patient will benefit from skilled therapeutic intervention in order to improve the following deficits and impairments:  Abnormal gait, Decreased strength, Difficulty walking, Impaired flexibility, Impaired sensation, Pain, Increased muscle spasms, Impaired perceived functional ability, Decreased activity tolerance, Decreased endurance, Decreased range of motion  Visit Diagnosis: Muscle weakness (generalized)  Difficulty in walking, not elsewhere classified  Other abnormalities of gait and mobility  Pain in right hip     Problem List Patient Active Problem List   Diagnosis Date Noted  . Fatigue 09/09/2017  . Status post total replacement of right hip 07/07/2017  . 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 Iron deficiency anemia 09/17/2016  . Status post total replacement of left hip 07/11/2016  . Chronic hypotension 07/02/2016  . Collapse of lung 07/02/2016  . H/O hepatitis 07/02/2016  . History of positive PPD 07/02/2016  . History of seizures as a child 07/02/2016  . Hypokalemia 07/02/2016  . Acquired dysplasia of left hip 04/19/2016  . Acquired dysplasia of right hip 04/19/2016  . 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 Hypothyroidism 07/17/2015  . OP (osteoporosis) 07/17/2015  . Lumbar radicular pain  08/04/2013  . Lumbar scoliosis 08/04/2013    Beacher May PT 09/19/2017, 6:04 PM  [place] [facility] PHYSICAL AND SPORTS MEDICINE [address], [place], [address] Phone: [phone]   Fax:  [phone]  Name: Christine Herring MRN: [id number] Date of Birth: Sep 17, 1986

## 2017-09-22 ENCOUNTER — Ambulatory Visit: Payer: Managed Care, Other (non HMO) | Attending: Orthopedic Surgery | Admitting: Physical Therapy

## 2017-09-22 ENCOUNTER — Encounter: Payer: Self-pay | Admitting: Physical Therapy

## 2017-09-22 DIAGNOSIS — R2689 Other abnormalities of gait and mobility: Secondary | ICD-10-CM

## 2017-09-22 DIAGNOSIS — R262 Difficulty in walking, not elsewhere classified: Secondary | ICD-10-CM

## 2017-09-22 DIAGNOSIS — M25551 Pain in right hip: Secondary | ICD-10-CM | POA: Diagnosis present

## 2017-09-22 DIAGNOSIS — M6281 Muscle weakness (generalized): Secondary | ICD-10-CM | POA: Diagnosis present

## 2017-09-22 NOTE — Therapy (Signed)
[place] [facility] [address], [place], [address] Phone: [phone]   Fax:  [phone]  Physical Therapy Treatment  Patient Details  Name: Christine Herring MRN: [id number] Date of Birth: 1986-02-17 Referring Provider: Park Meo MD  Encounter Date: 09/22/2017      PT End of Session - 09/22/17 1128    Visit Number 13   Number of Visits 16   Date for PT Re-Evaluation 09/24/17   PT Start Time 1015   PT Stop Time 1110   PT Time Calculation (min) 55 min   Activity Tolerance Patient tolerated treatment well   Behavior During Therapy [facility] for tasks assessed/performed      Past Medical History:  Diagnosis Date  . Anxiety   . Hip dysplasia   . Osteoporosis   . Seizures (HCC)    when she was a child reason not know  . Thyroid disease     Past Surgical History:  Procedure Laterality Date  . TOTAL HIP ARTHROPLASTY Right   . TUBAL LIGATION      There were no vitals filed for this visit.      Subjective Assessment - 09/22/17 1021    Subjective Patient reports she is seeing improvement with right LE with strength and ability to walk.    Pertinent History hx of B hip displasia, B hip labral reconstruction, osteroporosis, leg length discrepancy,  L THR 2017 and most recently right THR with abductor repair 07/07/2017   Limitations Sitting;Standing;Walking   Patient Stated Goals to get back to normal in order to be able to perform daily tasks difficulty   Currently in Pain? No/denies        Objective: Gait: right hip higher than left, ambulating with one crutch WBAT right LE  Treatment: Therapeutic exercise: Patient performed with instruction, verbal and tactile cues of therapist: goal: improve gait, LEFS, independent with home program  Supine hook lying: Hip adduction with ball and glute sets x 15 reps SAQ with ball under knee right LE 3# ankle weight x 10 reps Assisted hip abduction/adduction with 3#  above knee right LE x 5 reps Hip and knee flexion with 45cm ball under LE's: 10 reps  LTR with ball under LE's x 10 reps  Side lying left:  Right LE short arc end range hip abduction with assist of therapist 2-3 x 5 reps with fatigue  Stability ball on base; Cable exercises at [employer]:  Straight arm pull downs 15# x 10 reps Reverse chin up 15# x 15 reps Scapular rows bilateral x 15, 15# 5# bilateral flexion overhead for stabilization 15 reps 5# rotation seated on ball x 15 reps  Balance system:  Weight shifting forward and back x 2 min. Side to side x 2 min. Limits of stability each skill level 1x with platform unlocked #8 with up to 97% accuracy at lower skill levels and 93% at highest skill level  NuStep independent x 10 min. Level #5 intensity (unbilled time)  Patient response to treatment:  Patient demonstrated improved balance, motor control with repetition of all exercises.  patient demonstrated improved technique with exercises with minimal VC for correct alignment.                         PT Education - 09/22/17 1129    Education provided Yes   Education Details exercise instruction   Person(s) Educated Patient   Methods Explanation;Verbal cues   Comprehension Verbalized  understanding;Verbal cues required             PT Long Term Goals - 07/30/17 1310      PT LONG TERM GOAL #1   Title Pt will improve LEFS score to 30/80  demonstrating improved functional mobility    Baseline LEFS 9/80   Status New   Target Date 09/04/17     PT LONG TERM GOAL #2   Title Pt will improve LEFS score to 40/80  demonstrating improved functional mobility    Baseline LEFS 9/80   Status New   Target Date 09/24/17     PT LONG TERM GOAL #3   Title Pt will score >1.0 m/s without AD on 10 MW test for improved community mobility     Baseline ambulating with bilateral axillary crutches, slow cadence   Status New   Target Date 09/24/17     PT LONG TERM GOAL #4    Title Pt. will be independent with home program for exercises for strength, endurance, ROM weekly through discharge to allow self management once discharged from physical therapy   Baseline requires guidance, instruction and assistance for exercises   Status New   Target Date 09/24/17               Plan - 09/22/17 1128    Clinical Impression Statement Patient is advancing exercises with assistance due to continued weakness right hip musculature as she is healing from hip surgery.    Rehab Potential Good   Clinical Impairments Affecting Rehab Potential (+) age, motivated, acute condition of recent right hip surgery  (-) hx of hip displasia, multiple surgeries both hips   PT Frequency 2x / week   PT Duration 8 weeks   PT Treatment/Interventions Gait training;Therapeutic exercise;Balance training;Neuromuscular re-education;Patient/family education;Manual techniques;Passive range of motion;Electrical Stimulation;Moist Heat   PT Next Visit Plan therapeutic exercises for strength, balance, endurance   PT Home Exercise Plan core strengthening, gluteal setting, quad setting, ankle pumps      Patient will benefit from skilled therapeutic intervention in order to improve the following deficits and impairments:  Abnormal gait, Decreased strength, Difficulty walking, Impaired flexibility, Impaired sensation, Pain, Increased muscle spasms, Impaired perceived functional ability, Decreased activity tolerance, Decreased endurance, Decreased range of motion  Visit Diagnosis: Muscle weakness (generalized)  Difficulty in walking, not elsewhere classified  Other abnormalities of gait and mobility  Pain in right hip     Problem List Patient Active Problem List   Diagnosis Date Noted  . Fatigue 09/09/2017  . Status post total replacement of right hip 07/07/2017  . 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 Iron deficiency anemia 09/17/2016  . Status post total  replacement of left hip 07/11/2016  . Chronic hypotension 07/02/2016  . Collapse of lung 07/02/2016  . H/O hepatitis 07/02/2016  . History of positive PPD 07/02/2016  . History of seizures as a child 07/02/2016  . Hypokalemia 07/02/2016  . Acquired dysplasia of left hip 04/19/2016  . Acquired dysplasia of right hip 04/19/2016  . 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 Hypothyroidism 07/17/2015  . OP (osteoporosis) 07/17/2015  . Lumbar radicular pain 08/04/2013  . Lumbar scoliosis 08/04/2013    Carl Best 09/22/2017, 11:30 AM  [place] [facility] PHYSICAL AND SPORTS MEDICINE [address] [place], [address] Phone: [phone]   Fax:  [phone]  Name: Collin Hendley MRN: [id number] Date of Birth: [birth year]

## 2017-09-24 ENCOUNTER — Ambulatory Visit: Payer: Managed Care, Other (non HMO) | Admitting: Physical Therapy

## 2017-09-24 ENCOUNTER — Encounter: Payer: Self-pay | Admitting: Physical Therapy

## 2017-09-24 DIAGNOSIS — M6281 Muscle weakness (generalized): Secondary | ICD-10-CM

## 2017-09-24 DIAGNOSIS — R2689 Other abnormalities of gait and mobility: Secondary | ICD-10-CM

## 2017-09-24 DIAGNOSIS — M25551 Pain in right hip: Secondary | ICD-10-CM

## 2017-09-24 DIAGNOSIS — R262 Difficulty in walking, not elsewhere classified: Secondary | ICD-10-CM

## 2017-09-24 NOTE — Therapy (Signed)
[place] [facility] [address], [place], [address] Phone: [phone]   Fax:  [phone]  Physical Therapy Treatment  Patient Details  Name: Christine Herring MRN: [id number] Date of Birth: [birth year] Referring Provider: Park Meo MD  Encounter Date: 09/24/2017      PT End of Session - 09/24/17 1028    Visit Number 13   Number of Visits 16   Date for PT Re-Evaluation 09/24/17   PT Start Time 1025   PT Stop Time 1113   PT Time Calculation (min) 48 min   Activity Tolerance Patient tolerated treatment well   Behavior During Therapy [facility] for tasks assessed/performed      Past Medical History:  Diagnosis Date  . Anxiety   . Hip dysplasia   . Osteoporosis   . Seizures (HCC)    when she was a child reason not know  . Thyroid disease     Past Surgical History:  Procedure Laterality Date  . TOTAL HIP ARTHROPLASTY Right   . TUBAL LIGATION      There were no vitals filed for this visit.      Subjective Assessment - 09/24/17 1027    Subjective Back sore today from lying on floor for yoga class yesterday   Pertinent History hx of B hip displasia, B hip labral reconstruction, osteroporosis, leg length discrepancy,  L THR 2017 and most recently right THR with abductor repair 07/07/2017   Limitations Sitting;Standing;Walking   Patient Stated Goals to get back to normal in order to be able to perform daily tasks difficulty   Currently in Pain? Other (Comment)  back soreness      Objective: Gait: right hip higher than left, ambulating with one crutch WBAT right LE, more level pelvis as compared to previous session Palpation: tender with moderate spasms palpable along bilateral lumbar spine paravertebral muscles  Treatment: Manual therapy: 5 min.: goal spasms, pain STM performed to patient's lumbar spine paraspinal muscles with patient side lying left, superficial techniques Therapeutic exercise: Patient  performed with instruction, verbal and tactile cues of therapist: goal: improve gait, LEFS, independent with home program  Supine hook lying: Hip adduction with ball and glute sets x 15 reps  Side lying left:  Right LE short arc end range hip abduction with assist of therapist 2-3 x 5 reps with fatigue  Stability ball on base; Cable exercises at [employer]:  Straight arm pull downs 15# x 10 reps Reverse chin up 20# x 15 reps Scapular rows bilateral x 15, 15# 5# bilateral flexion overhead for stabilization 15 reps 5# rotation seated on ball x 15 reps  Balance system:  Weight shifting forward and back x 1 min. Side to side x 1 min. Limits of stability each skill level 1x with platform unlocked #8 with up to 97% accuracy at lower skill levels and 95% at highest skill level  Leg press 45# x 25 reps bilateral   Patient response to treatment: Patient demonstrated improved endurance, motor control and strength with increased intensity of exercises. Mild fatigue noted at end of session. Minimal cuing for correct technique required for all exercises.        PT Education - 09/24/17 1028    Education provided Yes   Education Details exercise instruction   Person(s) Educated Patient   Methods Explanation   Comprehension Verbalized understanding             PT Long Term Goals - 07/30/17 1310  PT LONG TERM GOAL #1   Title Pt will improve LEFS score to 30/80  demonstrating improved functional mobility    Baseline LEFS 9/80   Status New   Target Date 09/04/17     PT LONG TERM GOAL #2   Title Pt will improve LEFS score to 40/80  demonstrating improved functional mobility    Baseline LEFS 9/80   Status New   Target Date 09/24/17     PT LONG TERM GOAL #3   Title Pt will score >1.0 m/s without AD on 10 MW test for improved community mobility     Baseline ambulating with bilateral axillary crutches, slow cadence   Status New   Target Date 09/24/17     PT LONG TERM GOAL  #4   Title Pt. will be independent with home program for exercises for strength, endurance, ROM weekly through discharge to allow self management once discharged from physical therapy   Baseline requires guidance, instruction and assistance for exercises   Status New   Target Date 09/24/17               Plan - 09/24/17 1120    Clinical Impression Statement Patient demonstrated improvement in strength right hip abduction, improved motor control and improved gait pattern following session today. She is progressing towards goals.    Rehab Potential Good   Clinical Impairments Affecting Rehab Potential (+) age, motivated, acute condition of recent right hip surgery  (-) hx of hip displasia, multiple surgeries both hips   PT Frequency 2x / week   PT Duration 8 weeks   PT Treatment/Interventions Gait training;Therapeutic exercise;Balance training;Neuromuscular re-education;Patient/family education;Manual techniques;Passive range of motion;Electrical Stimulation;Moist Heat   PT Next Visit Plan therapeutic exercises for strength, balance, endurance   PT Home Exercise Plan core strengthening, gluteal setting, quad setting, ankle pumps      Patient will benefit from skilled therapeutic intervention in order to improve the following deficits and impairments:  Abnormal gait, Decreased strength, Difficulty walking, Impaired flexibility, Impaired sensation, Pain, Increased muscle spasms, Impaired perceived functional ability, Decreased activity tolerance, Decreased endurance, Decreased range of motion  Visit Diagnosis: Muscle weakness (generalized)  Difficulty in walking, not elsewhere classified  Other abnormalities of gait and mobility  Pain in right hip     Problem List Patient Active Problem List   Diagnosis Date Noted  . Fatigue 09/09/2017  . Status post total replacement of right hip 07/07/2017  . 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 Iron deficiency  anemia 09/17/2016  . Status post total replacement of left hip 07/11/2016  . Chronic hypotension 07/02/2016  . Collapse of lung 07/02/2016  . H/O hepatitis 07/02/2016  . History of positive PPD 07/02/2016  . History of seizures as a child 07/02/2016  . Hypokalemia 07/02/2016  . Acquired dysplasia of left hip 04/19/2016  . Acquired dysplasia of right hip 04/19/2016  . 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 Hypothyroidism 07/17/2015  . OP (osteoporosis) 07/17/2015  . Lumbar radicular pain 08/04/2013  . Lumbar scoliosis 08/04/2013    Beacher May PT 09/25/2017, 3:00 PM  [place] [facility] PHYSICAL AND SPORTS MEDICINE [address], [place], [address] Phone: [phone]   Fax:  [phone]  Name: Christine Herring MRN: [id number] Date of Birth: 08-08-1986

## 2017-09-29 ENCOUNTER — Encounter: Payer: Self-pay | Admitting: Physical Therapy

## 2017-09-29 ENCOUNTER — Ambulatory Visit: Payer: Managed Care, Other (non HMO) | Admitting: Physical Therapy

## 2017-09-29 DIAGNOSIS — M6281 Muscle weakness (generalized): Secondary | ICD-10-CM | POA: Diagnosis not present

## 2017-09-29 DIAGNOSIS — R262 Difficulty in walking, not elsewhere classified: Secondary | ICD-10-CM

## 2017-09-29 NOTE — Therapy (Signed)
[facility] [address] [place], [address] Phone: [phone]   Fax:  [phone]  Physical Therapy Treatment/Discharge Summary  Patient Details  Name: Christine Herring MRN: [id number] Date of Birth: 1986/12/09 Referring Provider: Marchelle Folks MD  Encounter Date: 09/29/2017  Patient began physical therapy on 07/30/2017 and has attended 15 sessions through 09/29/2017. She has achieved goals and is independent in home program for continued self management of pain/symptoms and exercises as instructed. Plan discharge from physical therapy at this time.        PT End of Session - 09/29/17 0954    Visit Number 15   Number of Visits 16   Date for PT Re-Evaluation 09/29/17   PT Start Time 0945   PT Stop Time 1030   PT Time Calculation (min) 45 min   Activity Tolerance Patient tolerated treatment well   Behavior During Therapy [facility] for tasks assessed/performed      Past Medical History:  Diagnosis Date  . Anxiety   . Hip dysplasia   . Osteoporosis   . Seizures ([place])    when she was a child reason not know  . Thyroid disease     Past Surgical History:  Procedure Laterality Date  . TOTAL HIP ARTHROPLASTY Right   . TUBAL LIGATION      There were no vitals filed for this visit.      Subjective Assessment - 09/29/17 0950    Subjective Patient reports no pain and is noticing improvement with walking with better gait pattern. She is planning to get gym membership to continue with strengthening and conditioning.   Pertinent History hx of B hip displasia, B hip labral reconstruction, osteroporosis, leg length discrepancy,  L THR 2017 and most recently right THR with abductor repair 07/07/2017   Limitations Sitting;Standing;Walking   Patient Stated Goals to get back to normal in order to be able to perform daily tasks difficulty   Currently in Pain? No/denies      Objective: Gait: right hip higher than left,  ambulating with one crutch WBAT right LE, improved posture with more level pelvis by 50% since beginning physical therapy Outcome measure: LEFS 40/80 Strength: right hip abduction 3-/5 and improving, flexion 3+/5, knee extension 4/5, flexion 4/5  Treatment: Therapeutic exercise: Patient performed with instruction, verbal and tactile cues of therapist: goal: improve gait, LEFS, independent with home program  Supine hook lying: Hip adduction with ball and glute sets with partial bridge x 15 reps  Side lying left:  Right LE short arc end range hip abduction with assist of therapist 2-3 x 5 reps with fatigue  Standing: Cable exercises at [employer]:  Straight arm pull downs 15# x 10 reps Reverse chin up seated in chair 20# x 15 reps Scapular rows bilateral x 15, 15# Standing single leg balance ball toss with opposite LE resting on BOSU ball; toss 2# ball x 10 reps  Balance system:  Weight shifting forward and back x 1 min. Side to side x 1 min. Limits of stability each skill level 1x with platform unlocked #8 with up to 97% accuracy Random control low skill level x 2 min.  Leg press 45# x 25 reps bilateral   Patient response to treatment: patient demonstrated good technique, improved motor control with all exercises with minimal cuing and repetition.           PT Education - 09/29/17 [phone]    Education provided Yes   Education  Details re assessed home program   Person(s) Educated Patient   Methods Explanation   Comprehension Verbalized understanding             PT Long Term Goals - 09/29/17 1030      PT LONG TERM GOAL #1   Title Pt will improve LEFS score to 30/80  demonstrating improved functional mobility    Baseline LEFS 9/80   Status Achieved     PT LONG TERM GOAL #2   Title Pt will improve LEFS score to 40/80  demonstrating improved functional mobility    Baseline LEFS 9/80   Status Achieved     PT LONG TERM GOAL #3   Title Pt will score >1.0 m/s  without AD on 10 MW test for improved community mobility     Baseline ambulating with bilateral axillary crutches, slow cadence   Status Partially Met     PT LONG TERM GOAL #4   Title Pt. will be independent with home program for exercises for strength, endurance, ROM weekly through discharge to allow self management once discharged from physical therapy   Baseline requires guidance, instruction and assistance for exercises   Status Achieved               Plan - 09/29/17 0955    Clinical Impression Statement Patient demonstrates improvement with right hip strength and endurance and should continue to improve with independent home exercises. She has functional strength for ambulation on level surfaces and in the community.  She demonstrates good knowledge of home program and will transition to local fitness facility.  Patient should continue to improve with self management and home program.   Rehab Potential Good   Clinical Impairments Affecting Rehab Potential (+) age, motivated, acute condition of recent right hip surgery  (-) hx of hip displasia, multiple surgeries both hips   PT Frequency 2x / week   PT Duration 8 weeks   PT Treatment/Interventions Gait training;Therapeutic exercise;Balance training;Neuromuscular re-education;Patient/family education;Manual techniques;Passive range of motion;Electrical Stimulation;Moist Heat   PT Next Visit Plan discharge    PT Home Exercise Plan core strengthening, gluteal setting, quad setting, ankle pumps, partial bridging, quad setting, SLR sitting in chair   Consulted and Agree with Plan of Care Patient      Patient will benefit from skilled therapeutic intervention in order to improve the following deficits and impairments:  Abnormal gait, Decreased strength, Difficulty walking, Impaired flexibility, Impaired sensation, Pain, Increased muscle spasms, Impaired perceived functional ability, Decreased activity tolerance, Decreased endurance,  Decreased range of motion  Visit Diagnosis: Muscle weakness (generalized) - Plan: PT plan of care cert/re-cert  Difficulty in walking, not elsewhere classified - Plan: PT plan of care cert/re-cert     Problem List Patient Active Problem List   Diagnosis Date Noted  . Fatigue 09/09/2017  . Status post total replacement of right hip 07/07/2017  . 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 Iron deficiency anemia 09/17/2016  . Status post total replacement of left hip 07/11/2016  . Chronic hypotension 07/02/2016  . Collapse of lung 07/02/2016  . H/O hepatitis 07/02/2016  . History of positive PPD 07/02/2016  . History of seizures as a child 07/02/2016  . Hypokalemia 07/02/2016  . Acquired dysplasia of left hip 04/19/2016  . Acquired dysplasia of right hip 04/19/2016  . 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 Hypothyroidism 07/17/2015  . OP (osteoporosis) 07/17/2015  . Lumbar radicular pain 08/04/2013  . Lumbar scoliosis 08/04/2013  Jomarie Longs PT 09/29/2017, 10:15 PM  [facility] [address], [place], [address] Phone: [phone]   Fax:  [phone]  Name: Carletta Feasel MRN: [id number] Date of Birth: 10/09/1986

## 2017-10-01 ENCOUNTER — Ambulatory Visit: Payer: Managed Care, Other (non HMO) | Admitting: Physical Therapy

## 2017-10-06 ENCOUNTER — Encounter: Payer: Managed Care, Other (non HMO) | Admitting: Physical Therapy

## 2017-10-09 ENCOUNTER — Encounter: Payer: Managed Care, Other (non HMO) | Admitting: Physical Therapy

## 2017-10-13 ENCOUNTER — Encounter: Payer: Managed Care, Other (non HMO) | Admitting: Physical Therapy

## 2017-10-16 ENCOUNTER — Encounter: Payer: Managed Care, Other (non HMO) | Admitting: Physical Therapy

## 2017-10-20 ENCOUNTER — Encounter: Payer: Managed Care, Other (non HMO) | Admitting: Physical Therapy

## 2017-10-23 ENCOUNTER — Encounter: Payer: Managed Care, Other (non HMO) | Admitting: Physical Therapy

## 2017-12-25 ENCOUNTER — Other Ambulatory Visit: Payer: Self-pay | Admitting: Nurse Practitioner

## 2017-12-25 NOTE — Telephone Encounter (Signed)
Pt. Called requesting a refill on levothyroxine [facility] Pt also have reqeusted a call when medication called in.

## 2017-12-26 ENCOUNTER — Other Ambulatory Visit: Payer: Self-pay

## 2017-12-26 DIAGNOSIS — E034 Atrophy of thyroid (acquired): Secondary | ICD-10-CM

## 2017-12-26 NOTE — Telephone Encounter (Signed)
I attempted to contact the pt, no answer. I left a detail message on the pt vm stating that she already have a years worth of refills.

## 2018-01-06 MED ORDER — LEVOTHYROXINE SODIUM 100 MCG PO TABS: 100.0000 ug | ORAL_TABLET | Freq: Every day | ORAL | 2 refills | Status: DC
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
## 2018-05-26 ENCOUNTER — Other Ambulatory Visit (HOSPITAL_COMMUNITY): Payer: Self-pay | Admitting: Family Medicine

## 2018-05-26 ENCOUNTER — Ambulatory Visit
Admission: RE | Admit: 2018-05-26 | Discharge: 2018-05-26 | Disposition: A | Discharge status: Home / Self Care | Payer: Self-pay | Source: Ambulatory Visit | Attending: Family Medicine | Admitting: Family Medicine

## 2018-05-26 DIAGNOSIS — R7611 Nonspecific reaction to tuberculin skin test without active tuberculosis: Secondary | ICD-10-CM

## 2018-07-17 ENCOUNTER — Other Ambulatory Visit: Payer: Self-pay

## 2018-07-17 ENCOUNTER — Encounter (HOSPITAL_COMMUNITY): Payer: Self-pay | Admitting: Emergency Medicine

## 2018-07-17 ENCOUNTER — Emergency Department (HOSPITAL_COMMUNITY)
Admission: EM | Admit: 2018-07-17 | Discharge: 2018-07-18 | Disposition: A | Discharge status: Home / Self Care | Payer: No Typology Code available for payment source | Attending: Emergency Medicine | Admitting: Emergency Medicine

## 2018-07-17 ENCOUNTER — Emergency Department (HOSPITAL_COMMUNITY): Payer: No Typology Code available for payment source

## 2018-07-17 DIAGNOSIS — Z79899 Other long term (current) drug therapy: Secondary | ICD-10-CM | POA: Insufficient documentation

## 2018-07-17 DIAGNOSIS — Y999 Unspecified external cause status: Secondary | ICD-10-CM | POA: Insufficient documentation

## 2018-07-17 DIAGNOSIS — E039 Hypothyroidism, unspecified: Secondary | ICD-10-CM | POA: Diagnosis not present

## 2018-07-17 DIAGNOSIS — Z96641 Presence of right artificial hip joint: Secondary | ICD-10-CM | POA: Diagnosis not present

## 2018-07-17 DIAGNOSIS — Z87891 Personal history of nicotine dependence: Secondary | ICD-10-CM | POA: Insufficient documentation

## 2018-07-17 DIAGNOSIS — R079 Chest pain, unspecified: Secondary | ICD-10-CM | POA: Insufficient documentation

## 2018-07-17 DIAGNOSIS — M25512 Pain in left shoulder: Secondary | ICD-10-CM | POA: Insufficient documentation

## 2018-07-17 DIAGNOSIS — S199XXA Unspecified injury of neck, initial encounter: Secondary | ICD-10-CM | POA: Diagnosis present

## 2018-07-17 DIAGNOSIS — S161XXA Strain of muscle, fascia and tendon at neck level, initial encounter: Secondary | ICD-10-CM | POA: Insufficient documentation

## 2018-07-17 DIAGNOSIS — Y929 Unspecified place or not applicable: Secondary | ICD-10-CM | POA: Insufficient documentation

## 2018-07-17 DIAGNOSIS — Y939 Activity, unspecified: Secondary | ICD-10-CM | POA: Insufficient documentation
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
## 2018-07-17 NOTE — ED Notes (Addendum)
Pt reports that her left arm feels heavy. C- collar applied buy EMS PTA

## 2018-07-18 MED ORDER — KETOROLAC TROMETHAMINE 60 MG/2ML IM SOLN
60.0000 mg | Freq: Once | INTRAMUSCULAR | Status: AC
  Administered 2018-07-18: 60 mg via INTRAMUSCULAR
  Filled 2018-07-18: qty 2

## 2018-07-18 MED ORDER — METHOCARBAMOL 500 MG PO TABS
500.0000 mg | ORAL_TABLET | Freq: Once | ORAL | Status: AC
  Administered 2018-07-18: 500 mg via ORAL
  Filled 2018-07-18: qty 1

## 2018-07-18 MED ORDER — METHOCARBAMOL 500 MG PO TABS: 500.0000 mg | ORAL_TABLET | Freq: Two times a day (BID) | ORAL | 0 refills | Status: DC

## 2018-07-18 MED ORDER — TRAMADOL HCL 50 MG PO TABS: 50.0000 mg | ORAL_TABLET | Freq: Three times a day (TID) | ORAL | 0 refills | Status: DC | PRN

## 2018-07-18 MED ORDER — OXYCODONE-ACETAMINOPHEN 5-325 MG PO TABS
1.0000 | ORAL_TABLET | Freq: Once | ORAL | Status: AC
  Administered 2018-07-18: 1 via ORAL
  Filled 2018-07-18: qty 1

## 2018-07-18 MED ORDER — NAPROXEN 500 MG PO TABS: 500.0000 mg | ORAL_TABLET | Freq: Two times a day (BID) | ORAL | 0 refills | Status: DC
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
## 2018-09-11 ENCOUNTER — Encounter: Payer: Self-pay | Admitting: Nurse Practitioner

## 2018-09-11 ENCOUNTER — Ambulatory Visit (INDEPENDENT_AMBULATORY_CARE_PROVIDER_SITE_OTHER): Payer: Self-pay | Admitting: Nurse Practitioner

## 2018-09-11 ENCOUNTER — Other Ambulatory Visit: Payer: Self-pay

## 2018-09-11 VITALS — BP 82/49 | HR 55 | Temp 98.6°F | Ht 64.0 in | Wt 169.2 lb

## 2018-09-11 DIAGNOSIS — R0989 Other specified symptoms and signs involving the circulatory and respiratory systems: Secondary | ICD-10-CM

## 2018-09-11 DIAGNOSIS — R590 Localized enlarged lymph nodes: Secondary | ICD-10-CM

## 2018-09-11 DIAGNOSIS — F458 Other somatoform disorders: Secondary | ICD-10-CM

## 2018-09-11 DIAGNOSIS — E034 Atrophy of thyroid (acquired): Secondary | ICD-10-CM

## 2018-09-11 DIAGNOSIS — R198 Other specified symptoms and signs involving the digestive system and abdomen: Secondary | ICD-10-CM

## 2018-09-11 DIAGNOSIS — Z23 Encounter for immunization: Secondary | ICD-10-CM

## 2018-09-11 LAB — CBC WITH DIFFERENTIAL/PLATELET
Basophils Absolute: 49 cells/uL (ref 0–200)
Basophils Relative: 0.6 %
Eosinophils Absolute: 131 cells/uL (ref 15–500)
Eosinophils Relative: 1.6 %
HCT: 38.8 % (ref 35.0–45.0)
Hemoglobin: 12.3 g/dL (ref 11.7–15.5)
Lymphs Abs: 2362 cells/uL (ref 850–3900)
MCH: 24.4 pg — ABNORMAL LOW (ref 27.0–33.0)
MCHC: 31.7 g/dL — ABNORMAL LOW (ref 32.0–36.0)
MCV: 76.8 fL — ABNORMAL LOW (ref 80.0–100.0)
MPV: 11.2 fL (ref 7.5–12.5)
Monocytes Relative: 6.1 %
Neutro Abs: 5158 cells/uL (ref 1500–7800)
Neutrophils Relative %: 62.9 %
Platelets: 303 10*3/uL (ref 140–400)
RBC: 5.05 10*6/uL (ref 3.80–5.10)
RDW: 14.5 % (ref 11.0–15.0)
Total Lymphocyte: 28.8 %
WBC mixed population: 500 cells/uL (ref 200–950)
WBC: 8.2 10*3/uL (ref 3.8–10.8)

## 2018-09-11 LAB — TSH+FREE T4: TSH W/REFLEX TO FT4: 1.43 mIU/L

## 2018-09-11 NOTE — Patient Instructions (Addendum)
Christine Herring,   Thank you for coming in to clinic today.  1. [facility] will call you to schedule your thyroid ultrasound  2. Continue levothyroxine 100 mcg once daily.  3. Labs today.  Please schedule a follow-up appointment with Christine Herring, AGNP. Return in about 6 months (around 03/12/2019) for OV with me for thyroid, repeat TSH labs in 3 months.  If you have any other questions or concerns, please feel free to call the clinic or send a message through MyChart. You may also schedule an earlier appointment if necessary.  You will receive a survey after today's visit either digitally by e-mail or paper by [employer] mail. Your experiences and feedback matter to us.  Please respond so we know how we are doing as we provide care for you.   Christine McardleLauren Shamera Yarberry, DNP, AGNP-BC Adult Gerontology Nurse Practitioner [facility], [facility]

## 2018-09-11 NOTE — Assessment & Plan Note (Signed)
Status currently unknown.  Patient is having increasing symptoms of thyroid hormone instability.  Stable TSH at last check.  No repeat in last 12 months. Pt taking w/ other medications, but is waiting for meals.   Plan: 1. Continue levothyroxine at 100 mcg once daily until after labs. 2. Reviewed appropriate dosing.  Take only w/ water.  Wait 30 mins-1 hour before other meds, drink, or food.  Wait 2-4 hours before supplements including calcium, iron, phosphorous. 3. with new symptoms of globus sensation, neck pressure - check thyroid ultrasound 4. Recheck TSH, FreeT4 5. Follow up 3 months.

## 2018-09-11 NOTE — Progress Notes (Signed)
Subjective:    Patient ID: Christine Herring, female    DOB: 1986-04-12, 32 y.o.   MRN: [id number]  Christine Herring is a 32 y.o. female presenting on 09/11/2018 for Thyroid Problem   HPI Hypothyroidism - Pt states she is taking her levothyroxine 100 mcg in the am at least 1 hour before eating or drinking and taking other medicines. - She is symptomatic with compressive symptoms.  Has some sore throat/neck.  About 1 week ago, feels pain/pressure in neck.  May also have swollen lymph nodes.  Feels that something is stuck in throat.  Anxiousness is also increased.  Has had anxiety in past 2/2 thyroid hormone imbalance.  Also fatigue. - She denies excess energy, weight changes, heart racing, heart palpitations, heat and cold intolerance, changes in hair/skin/nails, and lower leg swelling.  - She does have any other compressive symptoms with difficulty breathing when lying flat.   Social History   Tobacco Use  . Smoking status: Former Smoker    Packs/day: 0.50    Years: 10.00    Pack years: 5.00    Last attempt to quit: 07/16/2005    Years since quitting: 13.1  . Smokeless tobacco: Never Used  Substance Use Topics  . Alcohol use: Yes    Comment: social  . Drug use: No    Review of Systems Per HPI unless specifically indicated above     Objective:    BP (!) 82/49 (BP Location: Right Arm, Patient Position: Sitting, Cuff Size: Normal)   Pulse (!) 55   Temp 98.6 F (37 C) (Oral)   Ht 5\' 4"  (1.626 m)   Wt 169 lb 3.2 oz (76.7 kg)   LMP 08/12/2018   BMI 29.04 kg/m   Wt Readings from Last 3 Encounters:  09/11/18 169 lb 3.2 oz (76.7 kg)  07/17/18 165 lb (74.8 kg)  09/09/17 164 lb (74.4 kg)    Physical Exam  Constitutional: She is oriented to person, place, and time. She appears well-developed and well-nourished. No distress.  HENT:  Head: Normocephalic and atraumatic.  Eyes: Pupils are equal, round, and reactive to light. Conjunctivae and EOM are normal.  Neck: Thyromegaly (mild,  palpable thyromegaly without detection on inspection) present.  Cardiovascular: Normal rate, regular rhythm, S1 normal, S2 normal, normal heart sounds and intact distal pulses.  Pulmonary/Chest: Effort normal and breath sounds normal. No respiratory distress.  Abdominal: Soft. Bowel sounds are normal. She exhibits no distension.  Lymphadenopathy:    She has cervical adenopathy (single cervical LAD R upper superficial chain).  Neurological: She is alert and oriented to person, place, and time.  Skin: Skin is warm and dry. Capillary refill takes less than 2 seconds.  Normal hair distribution patterns  Psychiatric: She has a normal mood and affect. Her behavior is normal. Judgment and thought content normal.  Vitals reviewed.  Results for orders placed or performed in visit on 09/09/17  Lipid panel  Result Value Ref Range   Cholesterol 163 <200 mg/dL   HDL 33 (L) >40 mg/dL   Triglycerides 981 <191 mg/dL   LDL Cholesterol (Calc) 104 (H) mg/dL (calc)   Total CHOL/HDL Ratio 4.9 <5.0 (calc)   Non-HDL Cholesterol (Calc) 130 (H) <130 mg/dL (calc)  Comprehensive metabolic panel  Result Value Ref Range   Glucose, Bld 77 65 - 99 mg/dL   BUN 10 7 - 25 mg/dL   Creat 4.78 2.95 - 6.21 mg/dL   BUN/Creatinine Ratio NOT APPLICABLE 6 - 22 (calc)   Sodium 137  135 - 146 mmol/L   Potassium 4.0 3.5 - 5.3 mmol/L   Chloride 105 98 - 110 mmol/L   CO2 24 20 - 32 mmol/L   Calcium 9.0 8.6 - 10.2 mg/dL   Total Protein 7.3 6.1 - 8.1 g/dL   Albumin 4.1 3.6 - 5.1 g/dL   Globulin 3.2 1.9 - 3.7 g/dL (calc)   AG Ratio 1.3 1.0 - 2.5 (calc)   Total Bilirubin 0.4 0.2 - 1.2 mg/dL   Alkaline phosphatase (APISO) 91 33 - 115 U/L   AST 13 10 - 30 U/L   ALT 10 6 - 29 U/L  Hemoglobin A1c  Result Value Ref Range   Hgb A1c MFr Bld 4.9 <5.7 % of total Hgb   Mean Plasma Glucose 94 (calc)   eAG (mmol/L) 5.2 (calc)  VITAMIN D 25 Hydroxy (Vit-D Deficiency, Fractures)  Result Value Ref Range   Vit D, 25-Hydroxy 29 (L) 30 -  100 ng/mL      Assessment & Plan:   Problem List Items Addressed This Visit      Endocrine   Hypothyroidism - Primary    Status currently unknown.  Patient is having increasing symptoms of thyroid hormone instability.  Stable TSH at last check.  No repeat in last 12 months. Pt taking w/ other medications, but is waiting for meals.   Plan: 1. Continue levothyroxine at 100 mcg once daily until after labs. 2. Reviewed appropriate dosing.  Take only w/ water.  Wait 30 mins-1 hour before other meds, drink, or food.  Wait 2-4 hours before supplements including calcium, iron, phosphorous. 3. with new symptoms of globus sensation, neck pressure - check thyroid ultrasound 4. Recheck TSH, FreeT4 5. Follow up 3 months.      Relevant Orders   TSH + free T4   US THYROID    Other Visit Diagnoses    Globus sensation       Relevant Orders   TSH + free T4   US THYROID   LAD (lymphadenopathy), cervical       Relevant Orders   CBC with Differential/Platelet   Need for immunization against influenza       Relevant Orders   Flu Vaccine QUAD 6+ mos PF IM (Fluarix Quad PF) (Completed)    Pt < age 30.  Needs annual influenza vaccine.  Plan: 1. Administer Quad flu vaccine.   Follow up plan: Return in about 6 months (around 03/12/2019) for OV with me for thyroid, repeat TSH labs in 3 months.  Wilhelmina McardleLauren Cardell Rachel, DNP, AGPCNP-BC Adult Gerontology Primary Care Nurse Practitioner [facility] 09/11/2018, 8:28 AM

## 2018-09-15 ENCOUNTER — Other Ambulatory Visit: Payer: Self-pay

## 2018-09-17 ENCOUNTER — Ambulatory Visit
Admission: RE | Admit: 2018-09-17 | Discharge: 2018-09-17 | Disposition: A | Discharge status: Home / Self Care | Payer: No Typology Code available for payment source | Source: Ambulatory Visit | Attending: Nurse Practitioner | Admitting: Nurse Practitioner

## 2018-09-17 DIAGNOSIS — E034 Atrophy of thyroid (acquired): Secondary | ICD-10-CM

## 2018-09-17 DIAGNOSIS — R0989 Other specified symptoms and signs involving the circulatory and respiratory systems: Secondary | ICD-10-CM

## 2018-09-17 DIAGNOSIS — F458 Other somatoform disorders: Secondary | ICD-10-CM | POA: Insufficient documentation

## 2019-01-21 ENCOUNTER — Telehealth: Payer: Self-pay | Admitting: Nurse Practitioner

## 2019-01-21 ENCOUNTER — Other Ambulatory Visit: Payer: Self-pay | Admitting: Nurse Practitioner

## 2019-01-21 DIAGNOSIS — E034 Atrophy of thyroid (acquired): Secondary | ICD-10-CM

## 2019-01-21 NOTE — Telephone Encounter (Signed)
Pt needs refill on levothyroxine sent to [facility]

## 2019-01-22 NOTE — Telephone Encounter (Signed)
The prescription was sent to the patient pharmacy.

## 2019-02-17 ENCOUNTER — Other Ambulatory Visit: Payer: Self-pay | Admitting: Nurse Practitioner

## 2019-02-17 DIAGNOSIS — E034 Atrophy of thyroid (acquired): Secondary | ICD-10-CM

## 2019-02-22 ENCOUNTER — Other Ambulatory Visit: Payer: Self-pay

## 2019-02-22 ENCOUNTER — Encounter: Payer: Self-pay | Admitting: Nurse Practitioner

## 2019-02-22 ENCOUNTER — Ambulatory Visit (INDEPENDENT_AMBULATORY_CARE_PROVIDER_SITE_OTHER): Payer: No Typology Code available for payment source | Admitting: Nurse Practitioner

## 2019-02-22 VITALS — BP 86/53 | HR 59 | Temp 98.0°F | Ht 64.0 in | Wt 170.8 lb

## 2019-02-22 DIAGNOSIS — L299 Pruritus, unspecified: Secondary | ICD-10-CM

## 2019-02-22 DIAGNOSIS — E034 Atrophy of thyroid (acquired): Secondary | ICD-10-CM | POA: Diagnosis not present

## 2019-02-22 DIAGNOSIS — L910 Hypertrophic scar: Secondary | ICD-10-CM | POA: Diagnosis not present

## 2019-02-22 DIAGNOSIS — R5383 Other fatigue: Secondary | ICD-10-CM

## 2019-02-22 MED ORDER — TRIAMCINOLONE ACETONIDE 0.1 % EX CREA: 1.0000 "application " | TOPICAL_CREAM | Freq: Two times a day (BID) | CUTANEOUS | 0 refills | Status: DC

## 2019-02-22 NOTE — Progress Notes (Signed)
Subjective:    Patient ID: Christine Herring, female    DOB: 02-16-1986, 33 y.o.   MRN: [id number]  Christine Herring is a 33 y.o. female presenting on 02/22/2019 for Hypothyroidism   HPI Hypothyroidism - Pt states she is taking her levothyroxine 100 mcg in the am at least 1 hour before eating or drinking and taking other medicines.   - She is symptomatic with intermittent fatigue.  Pt is changing diet to reduce many meat products.  Pt did start B12 supplements and multivitamin. Takes these in the morning about 30-45 minutes after. Admits she is mentally fatigued and drained with school.  Patient wants to be able to stay focused and awake.  She feels uncontrolled tiredness.  Still feels sleepy even with adequate sleep. - Also notes dry skin, but admits weather being dry/cold may be contributing. - Periods are normally irregular, but have always been very long (6-7 days) and notes last 2-3 cycles have been lighter flow and only 3-4 days. - She denies, fatigue, excess energy, weight changes, heart racing, heart palpitations, heat and cold intolerance, changes in hair/nails, and lower leg swelling.  - She does not have any compressive symptoms to include difficulty swallowing, globus sensation, or difficulty breathing when lying flat.   Fatigue Patient does not feel rested in am when waking even when sleep is adequate for time slept.  Is tossing/turning in night, but not waking up.  Husband has commented on her restlessness. - Patient eats mostly plant-based diet over last month without meat products.  Has taken a vitamin B12 supplement drink since starting this.  Depression screen [facility] 2/9 02/22/2019 09/11/2018 09/09/2017  Decreased Interest 0 0 2  Down, Depressed, Hopeless 0 0 0  PHQ - 2 Score 0 0 2  Altered sleeping 1 - 0  Tired, decreased energy 2 - 1  Change in appetite 0 - 1  Feeling bad or failure about yourself  0 - 0  Trouble concentrating 1 - 1  Moving slowly or fidgety/restless 0 - 0  Suicidal  thoughts 0 - 0  PHQ-9 Score 4 - 5  Difficult doing work/chores Not difficult at all - Somewhat difficult     Social History   Tobacco Use  . Smoking status: Former Smoker    Packs/day: 0.50    Years: 10.00    Pack years: 5.00    Last attempt to quit: 07/16/2005    Years since quitting: 13.6  . Smokeless tobacco: Never Used  Substance Use Topics  . Alcohol use: Yes    Comment: social  . Drug use: No    Review of Systems Per HPI unless specifically indicated above     Objective:    BP (!) 86/53 (BP Location: Left Arm, Patient Position: Sitting, Cuff Size: Normal)   Pulse (!) 59   Temp 98 F (36.7 C) (Oral)   Ht 5\' 4"  (1.626 m)   Wt 170 lb 12.8 oz (77.5 kg)   BMI 29.32 kg/m   Wt Readings from Last 3 Encounters:  02/22/19 170 lb 12.8 oz (77.5 kg)  09/11/18 169 lb 3.2 oz (76.7 kg)  07/17/18 165 lb (74.8 kg)    Physical Exam Vitals signs reviewed.  Constitutional:      General: She is not in acute distress.    Appearance: She is well-developed.  HENT:     Head: Normocephalic and atraumatic.  Neck:     Thyroid: No thyromegaly.  Cardiovascular:     Rate and Rhythm: Normal rate  and regular rhythm.     Pulses:          Radial pulses are 2+ on the right side and 2+ on the left side.       Posterior tibial pulses are 1+ on the right side and 1+ on the left side.     Heart sounds: Normal heart sounds, S1 normal and S2 normal.  Pulmonary:     Effort: Pulmonary effort is normal. No respiratory distress.     Breath sounds: Normal breath sounds and air entry.  Musculoskeletal:     Right lower leg: No edema.     Left lower leg: No edema.  Skin:    General: Skin is warm and dry.     Capillary Refill: Capillary refill takes less than 2 seconds.       Neurological:     Mental Status: She is alert and oriented to person, place, and time.  Psychiatric:        Attention and Perception: Attention normal.        Mood and Affect: Mood and affect normal.        Behavior:  Behavior normal. Behavior is cooperative.        Thought Content: Thought content normal.        Judgment: Judgment normal.     Results for orders placed or performed in visit on 09/11/18  CBC with Differential/Platelet  Result Value Ref Range   WBC 8.2 3.8 - 10.8 Thousand/uL   RBC 5.05 3.80 - 5.10 Million/uL   Hemoglobin 12.3 11.7 - 15.5 g/dL   HCT 16.1 09.6 - 04.5 %   MCV 76.8 (L) 80.0 - 100.0 fL   MCH 24.4 (L) 27.0 - 33.0 pg   MCHC 31.7 (L) 32.0 - 36.0 g/dL   RDW 40.9 81.1 - 91.4 %   Platelets 303 140 - 400 Thousand/uL   MPV 11.2 7.5 - 12.5 fL   Neutro Abs 5,158 1,500 - 7,800 cells/uL   Lymphs Abs 2,362 850 - 3,900 cells/uL   WBC mixed population 500 200 - 950 cells/uL   Eosinophils Absolute 131 15 - 500 cells/uL   Basophils Absolute 49 0 - 200 cells/uL   Neutrophils Relative % 62.9 %   Total Lymphocyte 28.8 %   Monocytes Relative 6.1 %   Eosinophils Relative 1.6 %   Basophils Relative 0.6 %  TSH + free T4  Result Value Ref Range   TSH W/REFLEX TO FT4 1.43 mIU/L      Assessment & Plan:   Problem List Items Addressed This Visit      Endocrine   Hypothyroidism - Primary Previously stable hypothyroidism on recent lab checks.  Needs new lab check.  Has had history of lability with thyroid control in past.  Was seen by Jaquita Rector, PA-C at [facility] during that time.  Patient is not taking levothyroxine correctly and takes vitamins with breakfast shortly after   Plan: 1. Continue levothyroxine 100 mcg once daily. 2. Encouraged correct dosing with taking vitamins/supplements at least 4 hours after levothyroxine dosing. 3. Follow-up after labs and in 6 months.   Relevant Orders   TSH + free T4     Other   Fatigue Fatigue likely due to poor sleep pattern, low quality sleep.  Cannot exclude anemia or B12 deficiency given new plant-based diet.    Plan: 1. Labs today for vitamin B12 and CBC. 2. Repeat TSH as above. 3. Encouraged good sleep hygiene to promote  better sleep quality.  4. Follow-up after labs.   Relevant Orders   B12   CBC with Differential/Platelet    Other Visit Diagnoses    Keloid     Patient with known keloid, patient with concerns it is getting larger.  Now with pruritus that is bothersome.    Plan: 1. START triamcinolone cream 0.1% for pruritus and to reduce irritation at site of keloid.  Take only 10-14 days before stopping for 7 days and resuming prn. 2. Consider dermatology referral.  Likely no new treatment for keloid at this time. 3. Follow-up prn.   Relevant Medications   triamcinolone cream (KENALOG) 0.1 %   Pruritus       Relevant Medications   triamcinolone cream (KENALOG) 0.1 %      Meds ordered this encounter  Medications  . triamcinolone cream (KENALOG) 0.1 %    Sig: Apply 1 application topically 2 (two) times daily. For 10-14 days.  Stop, then repeat as needed.    Dispense:  30 g    Refill:  0    Order Specific Question:   Supervising Provider    Answer:   Smitty Cords [[id number]]   Follow up plan: Return in about 6 months (around 08/25/2019) for thyroid, annual physical.  Wilhelmina Mcardle, DNP, AGPCNP-BC Adult Gerontology Primary Care Nurse Practitioner [facility] 02/22/2019, 8:36 AM

## 2019-02-22 NOTE — Patient Instructions (Addendum)
Christine Herring,   Thank you for coming in to clinic today.  1. Continue levothyroxine 100 mcg once daily  2. Take always at least 4 hours before vitamins and supplements.    3. Sleep Hygiene Tips  Take medicines only as directed by your health care provider.  Keep regular sleeping and waking hours. Avoid naps.  Keep a sleep diary to help you and your health care provider figure out what could be causing your insomnia. Include:  When you sleep.  When you wake up during the night.  How well you sleep.  How rested you feel the next day.  Any side effects of medicines you are taking.  What you eat and drink.  Make your bedroom a comfortable place where it is easy to fall asleep:  Put up shades or special blackout curtains to block light from outside.  Use a white noise machine to block noise.  Keep the temperature cool.  Exercise regularly as directed by your health care provider. Avoid exercising right before bedtime.  Use relaxation techniques to manage stress. Ask your health care provider to suggest some techniques that may work well for you. These may include:  Breathing exercises.  Routines to release muscle tension.  Visualizing peaceful scenes.  Cut back on alcohol, caffeinated beverages, and cigarettes, especially close to bedtime. These can disrupt your sleep.  Do not overeat or eat spicy foods right before bedtime. This can lead to digestive discomfort that can make it hard for you to sleep.  Limit screen use before bedtime. This includes:  Watching TV.  Using your smartphone, tablet, and computer.  Stick to a routine. This can help you fall asleep faster. Try to do a quiet activity, brush your teeth, and go to bed at the same time each night.  Get out of bed if you are still awake after 15 minutes of trying to sleep. Keep the lights down, but try reading or doing a quiet activity. When you feel sleepy, go back to bed.  Make sure that you drive  carefully. Avoid driving if you feel very sleepy.  Keep all follow-up appointments as directed by your health care provider. This is important.  Please schedule a follow-up appointment with Wilhelmina Mcardle, AGNP. Return in about 6 months (around 08/25/2019) for thyroid, annual physical.  If you have any other questions or concerns, please feel free to call the clinic or send a message through MyChart. You may also schedule an earlier appointment if necessary.  You will receive a survey after today's visit either digitally by e-mail or paper by [employer]. Your experiences and feedback matter to [place].  Please respond so we know how we are doing as we provide care for you.   Wilhelmina Mcardle, DNP, AGNP-BC Adult Gerontology Nurse Practitioner [facility]

## 2019-02-24 LAB — IRON,TIBC AND FERRITIN PANEL
%SAT: 11 % (calc) — ABNORMAL LOW (ref 16–45)
Ferritin: 7 ng/mL — ABNORMAL LOW (ref 16–154)
Iron: 51 ug/dL (ref 40–190)
TIBC: 468 mcg/dL (calc) — ABNORMAL HIGH (ref 250–450)

## 2019-02-24 LAB — CBC WITH DIFFERENTIAL/PLATELET
Absolute Monocytes: 420 cells/uL (ref 200–950)
Basophils Absolute: 53 cells/uL (ref 0–200)
Basophils Relative: 0.7 %
Eosinophils Absolute: 120 cells/uL (ref 15–500)
Eosinophils Relative: 1.6 %
HCT: 39.4 % (ref 35.0–45.0)
Hemoglobin: 12.5 g/dL (ref 11.7–15.5)
Lymphs Abs: 2190 cells/uL (ref 850–3900)
MCH: 24.7 pg — ABNORMAL LOW (ref 27.0–33.0)
MCHC: 31.7 g/dL — ABNORMAL LOW (ref 32.0–36.0)
MCV: 77.9 fL — ABNORMAL LOW (ref 80.0–100.0)
MPV: 11.1 fL (ref 7.5–12.5)
Monocytes Relative: 5.6 %
Neutro Abs: 4718 cells/uL (ref 1500–7800)
Neutrophils Relative %: 62.9 %
Platelets: 301 10*3/uL (ref 140–400)
RBC: 5.06 10*6/uL (ref 3.80–5.10)
RDW: 14.4 % (ref 11.0–15.0)
Total Lymphocyte: 29.2 %
WBC: 7.5 10*3/uL (ref 3.8–10.8)

## 2019-02-24 LAB — VITAMIN B12: Vitamin B-12: 478 pg/mL (ref 200–1100)

## 2019-02-24 LAB — TSH+FREE T4: TSH W/REFLEX TO FT4: 2.45 mIU/L

## 2019-03-21 ENCOUNTER — Other Ambulatory Visit: Payer: Self-pay | Admitting: Nurse Practitioner

## 2019-03-21 DIAGNOSIS — E034 Atrophy of thyroid (acquired): Secondary | ICD-10-CM

## 2019-09-21 ENCOUNTER — Other Ambulatory Visit: Payer: Self-pay | Admitting: Nurse Practitioner

## 2019-09-21 DIAGNOSIS — E034 Atrophy of thyroid (acquired): Secondary | ICD-10-CM

## 2019-12-25 ENCOUNTER — Other Ambulatory Visit: Payer: Self-pay | Admitting: Nurse Practitioner

## 2019-12-25 DIAGNOSIS — E034 Atrophy of thyroid (acquired): Secondary | ICD-10-CM

## 2020-03-20 ENCOUNTER — Other Ambulatory Visit: Payer: Self-pay | Admitting: Family Medicine

## 2020-03-20 DIAGNOSIS — E034 Atrophy of thyroid (acquired): Secondary | ICD-10-CM

## 2020-04-24 ENCOUNTER — Other Ambulatory Visit: Payer: Self-pay | Admitting: Family Medicine

## 2020-04-24 DIAGNOSIS — E034 Atrophy of thyroid (acquired): Secondary | ICD-10-CM

## 2020-04-24 NOTE — Telephone Encounter (Signed)
Pt. Has appointment for this week.

## 2020-04-26 ENCOUNTER — Ambulatory Visit: Payer: Self-pay | Admitting: Family Medicine

## 2020-04-28 ENCOUNTER — Ambulatory Visit: Payer: Self-pay | Admitting: Family Medicine

## 2020-05-26 ENCOUNTER — Other Ambulatory Visit: Payer: Self-pay | Admitting: Family Medicine

## 2020-05-26 DIAGNOSIS — E034 Atrophy of thyroid (acquired): Secondary | ICD-10-CM

## 2020-05-26 NOTE — Telephone Encounter (Signed)
Requested medication (s) are due for refill today:  Yes  Requested medication (s) are on the active medication list:   Yes  Future visit scheduled:   No   Last ordered: 04/24/2020  #30 0 refills  Clinic note:  Returned because pt has not called in for an appt.  04/26/2020-No Show;  04/28/2020-cancelled by pt;  05/01/2020 a letter was sent to her regarding making an appt.    Requested Prescriptions  Pending Prescriptions Disp Refills   levothyroxine (SYNTHROID) 100 MCG tablet [Pharmacy Med Name: LEVOTHYROXINE 100 MCG TABLET] 30 tablet 0    Sig: TAKE 1 TABLET BY MOUTH EVERY DAY      Endocrinology:  Hypothyroid Agents Failed - 05/26/2020 11:33 AM      Failed - TSH needs to be rechecked within 3 months after an abnormal result. Refill until TSH is due.      Failed - TSH in normal range and within 360 days    TSH  Date Value Ref Range Status  09/03/2017 1.02 mIU/L Final    Comment:              Reference Range .           > or = 20 Years  0.40-4.50 .                Pregnancy Ranges           First trimester    0.26-2.66           Second trimester   0.55-2.73           Third trimester    0.43-2.91           Failed - Valid encounter within last 12 months    Recent Outpatient Visits           1 year ago Hypothyroidism due to acquired atrophy of thyroid   [facility] Galen Manila, NP   1 year ago Hypothyroidism due to acquired atrophy of thyroid   [facility] Galen Manila, NP   2 years ago Annual physical exam   [facility] Galen Manila, NP   3 years ago Cough, persistent   [facility] Kyung Rudd, Alison Stalling, NP   3 years ago Acute streptococcal pharyngitis   [facility] [place], Netta [place], [place]

## 2020-06-12 ENCOUNTER — Other Ambulatory Visit: Payer: Self-pay

## 2020-06-12 ENCOUNTER — Telehealth: Payer: Self-pay | Admitting: Nurse Practitioner

## 2020-06-12 DIAGNOSIS — E034 Atrophy of thyroid (acquired): Secondary | ICD-10-CM

## 2020-06-12 MED ORDER — LEVOTHYROXINE SODIUM 100 MCG PO TABS: 100.0000 ug | ORAL_TABLET | Freq: Every day | ORAL | 0 refills | Status: DC

## 2020-06-12 NOTE — Telephone Encounter (Signed)
Medication Refill - Medication:   levothyroxine (SYNTHROID) 100 MCG tablet [[id number]]   Preferred Pharmacy (with phone number or street name):  [id number] [place], [place] - [address]  [address] [place] [address]  Phone: [phone] Fax: [phone]    Agent: Please be advised that RX refills may take up to 3 business days. We ask that you follow-up with your pharmacy.

## 2020-06-12 NOTE — Telephone Encounter (Signed)
Copied from CRM [id number]. Topic: General - Inquiry >> Jun 12, 2020  3:34 PM Leary Roca wrote: Reason for CRM: pt currently has no insurance and would like to make sure she has enough medication to last until shes able to get insurance . Please advise

## 2020-06-12 NOTE — Telephone Encounter (Signed)
Patient cancelled and no-showed last 2 appointments in early May.  Needs office visit for refills.

## 2020-07-20 ENCOUNTER — Other Ambulatory Visit: Payer: Self-pay | Admitting: Family Medicine

## 2020-07-20 DIAGNOSIS — E034 Atrophy of thyroid (acquired): Secondary | ICD-10-CM

## 2020-07-20 NOTE — Telephone Encounter (Signed)
I spoke with the patient and informed her that she would need to come in for and appt with labs before getting anymore refills

## 2020-07-20 NOTE — Telephone Encounter (Signed)
Requested medication (s) are due for refill today: Yes  Requested medication (s) are on the active medication list: Yes  Last refill:  06/12/20  Future visit scheduled: No  Notes to clinic:  Left pt. Message to call and make an appointment.    Requested Prescriptions  Pending Prescriptions Disp Refills   levothyroxine (SYNTHROID) 100 MCG tablet [Pharmacy Med Name: LEVOTHYROXINE 100 MCG TABLET] 45 tablet 0    Sig: TAKE 1 TABLET BY MOUTH EVERY DAY      Endocrinology:  Hypothyroid Agents Failed - 07/20/2020  2:35 PM      Failed - TSH needs to be rechecked within 3 months after an abnormal result. Refill until TSH is due.      Failed - TSH in normal range and within 360 days    TSH  Date Value Ref Range Status  09/03/2017 1.02 mIU/L Final    Comment:              Reference Range .           > or = 20 Years  0.40-4.50 .                Pregnancy Ranges           First trimester    0.26-2.66           Second trimester   0.55-2.73           Third trimester    0.43-2.91           Failed - Valid encounter within last 12 months    Recent Outpatient Visits           1 year ago Hypothyroidism due to acquired atrophy of thyroid   [facility] Galen Manila, NP   1 year ago Hypothyroidism due to acquired atrophy of thyroid   [facility] Galen Manila, NP   2 years ago Annual physical exam   [facility] Galen Manila, NP   3 years ago Cough, persistent   [facility] Kyung Rudd, Alison Stalling, NP   3 years ago Acute streptococcal pharyngitis   [facility], Netta Ne[place], [place]

## 2020-08-03 ENCOUNTER — Telehealth: Payer: Self-pay | Admitting: Family Medicine

## 2020-08-03 DIAGNOSIS — E034 Atrophy of thyroid (acquired): Secondary | ICD-10-CM

## 2020-08-03 NOTE — Telephone Encounter (Signed)
Requested medication (s) are due for refill today: yes  Requested medication (s) are on the active medication list: yes  Last refill:  06/12/20 #45 0 refills  Future visit scheduled: no  Notes to clinic:  last OV > 1 year. Called and left voicemail to call back and schedule appt.     Requested Prescriptions  Pending Prescriptions Disp Refills   levothyroxine (SYNTHROID) 100 MCG tablet [Pharmacy Med Name: LEVOTHYROXINE 100 MCG TABLET] 45 tablet 0    Sig: TAKE 1 TABLET BY MOUTH EVERY DAY      Endocrinology:  Hypothyroid Agents Failed - 08/03/2020  3:47 PM      Failed - TSH needs to be rechecked within 3 months after an abnormal result. Refill until TSH is due.      Failed - TSH in normal range and within 360 days    TSH  Date Value Ref Range Status  09/03/2017 1.02 mIU/L Final    Comment:              Reference Range .           > or = 20 Years  0.40-4.50 .                Pregnancy Ranges           First trimester    0.26-2.66           Second trimester   0.55-2.73           Third trimester    0.43-2.91           Failed - Valid encounter within last 12 months    Recent Outpatient Visits           1 year ago Hypothyroidism due to acquired atrophy of thyroid   [facility] Christine Manila, NP   1 year ago Hypothyroidism due to acquired atrophy of thyroid   [facility] Christine Manila, NP   2 years ago Annual physical exam   [facility] Christine Manila, NP   3 years ago Cough, persistent   [facility] Kyung Rudd, Alison Stalling, NP   3 years ago Acute streptococcal pharyngitis   [facility] [place], Netta Ne[place], [place]

## 2020-08-04 NOTE — Telephone Encounter (Signed)
Declined refill.  Needs OV and labs

## 2020-08-07 ENCOUNTER — Encounter: Payer: Self-pay | Admitting: Family Medicine

## 2020-08-07 ENCOUNTER — Ambulatory Visit: Payer: Self-pay | Admitting: Family Medicine

## 2020-08-08 ENCOUNTER — Other Ambulatory Visit: Payer: Self-pay

## 2020-08-08 ENCOUNTER — Encounter: Payer: Self-pay | Admitting: Family Medicine

## 2020-08-08 ENCOUNTER — Ambulatory Visit (INDEPENDENT_AMBULATORY_CARE_PROVIDER_SITE_OTHER): Payer: No Typology Code available for payment source | Admitting: Family Medicine

## 2020-08-08 VITALS — BP 101/53 | HR 65 | Temp 98.2°F | Resp 17 | Ht 64.0 in | Wt 181.2 lb

## 2020-08-08 DIAGNOSIS — D509 Iron deficiency anemia, unspecified: Secondary | ICD-10-CM

## 2020-08-08 DIAGNOSIS — E034 Atrophy of thyroid (acquired): Secondary | ICD-10-CM | POA: Diagnosis not present

## 2020-08-08 DIAGNOSIS — E876 Hypokalemia: Secondary | ICD-10-CM

## 2020-08-08 DIAGNOSIS — R8290 Unspecified abnormal findings in urine: Secondary | ICD-10-CM | POA: Diagnosis not present

## 2020-08-08 DIAGNOSIS — E669 Obesity, unspecified: Secondary | ICD-10-CM

## 2020-08-08 LAB — POCT URINALYSIS DIPSTICK
Bilirubin, UA: NEGATIVE
Glucose, UA: NEGATIVE
Ketones, UA: NEGATIVE
Leukocytes, UA: NEGATIVE
Nitrite, UA: NEGATIVE
Protein, UA: NEGATIVE
Spec Grav, UA: 1.02 (ref 1.010–1.025)
Urobilinogen, UA: 0.2 E.U./dL
pH, UA: 5 (ref 5.0–8.0)

## 2020-08-08 NOTE — Assessment & Plan Note (Signed)
Status unknown.  Recheck labs.  Last labs 08/2017 with normal potassium levels.  Will have labs rechecked today for evaluation.

## 2020-08-08 NOTE — Assessment & Plan Note (Signed)
Interested in weight management.  Has been increasing her water to approximately 1 gallon per day, has been counting calories and working out 2-3x per week.  Has been following a keto diet and is concerned she hasn't seen weight loss.  Discussed importance of checking her thyroid hormone function and properly medicating, which can be related to her weight.  Discussed over the counter, Ally, which can be used to help with weight loss.  Encouraged apps for counting calories and increasing exercise to 4-5x per week.  Plan: 1. Continue to drink increased amount of water 2. Increase working out to 4-5x per week for 30 minutes per day 3. Begin counting calories and work towards eating in a calorie deficiency based on calories used for exercise 4. Can look at using over the counter Ally for help with weight management 5. RTC in 4 weeks for re-evaluation

## 2020-08-08 NOTE — Assessment & Plan Note (Signed)
Status unknown.  Recheck labs.  Having increased symptoms of weight gain and fatigue.  Last labs on file for thyroid were from 08/2017.  Labs to be drawn this morning and will discuss results with patient, along with dosing needed and administration tomorrow.  Plan: 1. Have labs drawn today and will notify regarding results tomorrow 2. Dosage of levothyroxine to be discussed and sent to pharmacy on file based on thyroid labs received 3. RTC in 6-8 weeks for re-evaluation

## 2020-08-08 NOTE — Patient Instructions (Signed)
Have your labs drawn and we will contact you once we have the results.  Once we receive your thyroid lab results, I will be in touch and will send in a prescription for your levothyroxine.  You can check into over the counter Ally, to help with weight loss.  Increase your water and daily exercise, aiming to exercise 4-5x per week, going no more than 2 days in a row without exercise.  There are apps such as MyFitnessPal, iTrackBites, Weight Watchers and Noom that can help with weight loss.    We will plan to see you back in 4 weeks for your physical and PAP testing  You will receive a survey after today's visit either digitally by e-mail or paper by USPS mail. Your experiences and feedback matter to [place].  Please respond so we know how we are doing as we provide care for you.  Call us with any questions/concerns/needs.  It is my goal to be available to you for your health concerns.  Thanks for choosing me to be a partner in your healthcare needs!  Charlaine Dalton, FNP-C Family Nurse Practitioner [facility] Phone: [phone]

## 2020-08-08 NOTE — Progress Notes (Signed)
Subjective:    Patient ID: Christine Herring, female    DOB: [birth year], 34 y.o.   MRN: [id number]  Christine Herring is a 34 y.o. female presenting on 08/08/2020 for Thyroid Problem (pt been out of her thyroid medication x 3 days )   HPI  Christine Herring presents to clinic for follow up on her thyroid.  Reports has been having some fatigue and weight gain, is unsure if her thyroid has been contributing.  Has been following a keto diet, exercising 2-3x per week, drinking approx 1 gallon of water per day without being able to lose weight.  Last labs on file for her thyroid are from 2018.  Denies any other acute concerns today.  Depression screen [facility] 2/9 08/08/2020 02/22/2019 09/11/2018  Decreased Interest 1 0 0  Down, Depressed, Hopeless 1 0 0  PHQ - 2 Score 2 0 0  Altered sleeping 2 1 -  Tired, decreased energy 2 2 -  Change in appetite 1 0 -  Feeling bad or failure about yourself  2 0 -  Trouble concentrating 1 1 -  Moving slowly or fidgety/restless 0 0 -  Suicidal thoughts 0 0 -  PHQ-9 Score 10 4 -  Difficult doing work/chores Somewhat difficult Not difficult at all -    Social History   Tobacco Use  . Smoking status: Former Smoker    Packs/day: 0.50    Years: 10.00    Pack years: 5.00    Quit date: 07/16/2005    Years since quitting: 15.0  . Smokeless tobacco: Never Used  Vaping Use  . Vaping Use: Never used  Substance Use Topics  . Alcohol use: Yes    Comment: social  . Drug use: No    Review of Systems  Constitutional: Positive for fatigue. Negative for activity change, appetite change, chills, diaphoresis, fever and unexpected weight change.  HENT: Negative.   Eyes: Negative.   Respiratory: Negative.   Cardiovascular: Negative.   Gastrointestinal: Negative.   Endocrine: Negative.   Genitourinary: Negative.   Musculoskeletal: Negative.   Skin: Negative.   Allergic/Immunologic: Negative.   Neurological: Negative.   Hematological: Negative.   Psychiatric/Behavioral: Negative.     Per HPI unless specifically indicated above     Objective:    BP (!) 101/53 (BP Location: Right Arm, Patient Position: Sitting, Cuff Size: Normal)   Pulse 65   Temp 98.2 F (36.8 C) (Oral)   Resp 17   Ht 5\' 4"  (1.626 m)   Wt 181 lb 3.2 oz (82.2 kg)   LMP 07/02/2020   SpO2 100%   BMI 31.10 kg/m   Wt Readings from Last 3 Encounters:  08/08/20 181 lb 3.2 oz (82.2 kg)  02/22/19 170 lb 12.8 oz (77.5 kg)  09/11/18 169 lb 3.2 oz (76.7 kg)    Physical Exam Vitals reviewed.  Constitutional:      General: She is not in acute distress.    Appearance: Normal appearance. She is well-developed and well-groomed. She is obese. She is not ill-appearing or toxic-appearing.  HENT:     Head: Normocephalic and atraumatic.     Nose:     Comments: 09/13/18 is in place, covering mouth and nose. Eyes:     General: Lids are normal. Vision grossly intact.        Right eye: No discharge.        Left eye: No discharge.     Extraocular Movements: Extraocular movements intact.     Conjunctiva/sclera: Conjunctivae normal.  Pupils: Pupils are equal, round, and reactive to light.  Neck:     Thyroid: No thyroid mass, thyromegaly or thyroid tenderness.  Cardiovascular:     Rate and Rhythm: Normal rate and regular rhythm.     Pulses: Normal pulses.     Heart sounds: Normal heart sounds. No murmur heard.  No friction rub. No gallop.   Pulmonary:     Effort: Pulmonary effort is normal. No respiratory distress.     Breath sounds: Normal breath sounds.  Musculoskeletal:     Cervical back: Normal range of motion and neck supple. No tenderness.     Right lower leg: No edema.     Left lower leg: No edema.  Lymphadenopathy:     Cervical: No cervical adenopathy.  Skin:    General: Skin is warm and dry.     Capillary Refill: Capillary refill takes less than 2 seconds.  Neurological:     General: No focal deficit present.     Mental Status: She is alert and oriented to person, place, and time.    Psychiatric:        Attention and Perception: Attention and perception normal.        Mood and Affect: Mood and affect normal.        Speech: Speech normal.        Behavior: Behavior normal. Behavior is cooperative.        Thought Content: Thought content normal.        Cognition and Memory: Cognition and memory normal.        Judgment: Judgment normal.    Results for orders placed or performed in visit on 08/08/20  POCT Urinalysis Dipstick  Result Value Ref Range   Color, UA yellow    Clarity, UA CLEAR    Glucose, UA Negative Negative   Bilirubin, UA negative    Ketones, UA negative    Spec Grav, UA 1.020 1.010 - 1.025   Blood, UA trace    pH, UA 5.0 5.0 - 8.0   Protein, UA Negative Negative   Urobilinogen, UA 0.2 0.2 or 1.0 E.U./dL   Nitrite, UA negative    Leukocytes, UA Negative Negative   Appearance     Odor        Assessment & Plan:   Problem List Items Addressed This Visit      Endocrine   Hypothyroidism - Primary    Status unknown.  Recheck labs.  Having increased symptoms of weight gain and fatigue.  Last labs on file for thyroid were from 08/2017.  Labs to be drawn this morning and will discuss results with patient, along with dosing needed and administration tomorrow.  Plan: 1. Have labs drawn today and will notify regarding results tomorrow 2. Dosage of levothyroxine to be discussed and sent to pharmacy on file based on thyroid labs received 3. RTC in 6-8 weeks for re-evaluation      Relevant Orders   Thyroid Panel With TSH     Other   Iron deficiency anemia    Status unknown.  Recheck labs.   Last labs on file 02/2019 with normal hemoglobin & hematocrit.  Will have labs drawn today for re-evaluation.      Relevant Orders   CBC with Differential   POCT Urinalysis Dipstick (Completed)   Hypokalemia    Status unknown.  Recheck labs.  Last labs 08/2017 with normal potassium levels.  Will have labs rechecked today for evaluation.      Relevant  Orders   COMPLETE METABOLIC PANEL WITH GFR   POCT Urinalysis Dipstick (Completed)   Obesity (BMI 30-39.9)    Interested in weight management.  Has been increasing her water to approximately 1 gallon per day, has been counting calories and working out 2-3x per week.  Has been following a keto diet and is concerned she hasn't seen weight loss.  Discussed importance of checking her thyroid hormone function and properly medicating, which can be related to her weight.  Discussed over the counter, Ally, which can be used to help with weight loss.  Encouraged apps for counting calories and increasing exercise to 4-5x per week.  Plan: 1. Continue to drink increased amount of water 2. Increase working out to 4-5x per week for 30 minutes per day 3. Begin counting calories and work towards eating in a calorie deficiency based on calories used for exercise 4. Can look at using over the counter Ally for help with weight management 5. RTC in 4 weeks for re-evaluation       Other Visit Diagnoses    Abnormal urinalysis       Relevant Orders   POCT Urinalysis Dipstick (Completed)      No orders of the defined types were placed in this encounter.     Follow up plan: Return in about 4 weeks (around 09/05/2020) for CPE and PAP testing.   Charlaine Dalton, FNP Family Nurse Practitioner [facility] 08/08/2020, 9:00 AM

## 2020-08-08 NOTE — Assessment & Plan Note (Signed)
Status unknown.  Recheck labs.   Last labs on file 02/2019 with normal hemoglobin & hematocrit.  Will have labs drawn today for re-evaluation.

## 2020-08-09 ENCOUNTER — Telehealth: Payer: Self-pay | Admitting: *Deleted

## 2020-08-09 ENCOUNTER — Other Ambulatory Visit: Payer: Self-pay | Admitting: Family Medicine

## 2020-08-09 DIAGNOSIS — I9589 Other hypotension: Secondary | ICD-10-CM

## 2020-08-09 LAB — CBC WITH DIFFERENTIAL/PLATELET
Absolute Monocytes: 511 cells/uL (ref 200–950)
Basophils Absolute: 37 cells/uL (ref 0–200)
Basophils Relative: 0.5 %
Eosinophils Absolute: 207 cells/uL (ref 15–500)
Eosinophils Relative: 2.8 %
HCT: 40.3 % (ref 35.0–45.0)
Hemoglobin: 12.7 g/dL (ref 11.7–15.5)
Lymphs Abs: 2050 cells/uL (ref 850–3900)
MCH: 26.4 pg — ABNORMAL LOW (ref 27.0–33.0)
MCHC: 31.5 g/dL — ABNORMAL LOW (ref 32.0–36.0)
MCV: 83.8 fL (ref 80.0–100.0)
MPV: 10.8 fL (ref 7.5–12.5)
Monocytes Relative: 6.9 %
Neutro Abs: 4595 cells/uL (ref 1500–7800)
Neutrophils Relative %: 62.1 %
Platelets: 253 10*3/uL (ref 140–400)
RBC: 4.81 10*6/uL (ref 3.80–5.10)
RDW: 13.9 % (ref 11.0–15.0)
Total Lymphocyte: 27.7 %
WBC: 7.4 10*3/uL (ref 3.8–10.8)

## 2020-08-09 LAB — THYROID PANEL WITH TSH
Free Thyroxine Index: 4.9 — ABNORMAL HIGH (ref 1.4–3.8)
T3 Uptake: 34 % (ref 22–35)
T4, Total: 14.4 ug/dL — ABNORMAL HIGH (ref 5.1–11.9)
TSH: 0.28 mIU/L — ABNORMAL LOW

## 2020-08-09 LAB — COMPLETE METABOLIC PANEL WITH GFR
AG Ratio: 1.5 (calc) (ref 1.0–2.5)
ALT: 18 U/L (ref 6–29)
AST: 17 U/L (ref 10–30)
Albumin: 4.1 g/dL (ref 3.6–5.1)
Alkaline phosphatase (APISO): 77 U/L (ref 31–125)
BUN: 18 mg/dL (ref 7–25)
CO2: 25 mmol/L (ref 20–32)
Calcium: 9 mg/dL (ref 8.6–10.2)
Chloride: 105 mmol/L (ref 98–110)
Creat: 0.68 mg/dL (ref 0.50–1.10)
GFR, Est African American: 134 mL/min/{1.73_m2} (ref >=60)
GFR, Est Non African American: 116 mL/min/{1.73_m2} (ref >=60)
Globulin: 2.8 g/dL (calc) (ref 1.9–3.7)
Glucose, Bld: 79 mg/dL (ref 65–99)
Potassium: 4.3 mmol/L (ref 3.5–5.3)
Sodium: 138 mmol/L (ref 135–146)
Total Bilirubin: 0.5 mg/dL (ref 0.2–1.2)
Total Protein: 6.9 g/dL (ref 6.1–8.1)

## 2020-08-09 MED ORDER — LEVOTHYROXINE SODIUM 75 MCG PO TABS: 75.0000 ug | ORAL_TABLET | Freq: Every day | ORAL | 0 refills | Status: DC

## 2020-08-09 NOTE — Telephone Encounter (Signed)
Lab results and provider's note reviewed with the patient. No further questions.

## 2020-08-18 ENCOUNTER — Other Ambulatory Visit: Payer: Self-pay

## 2020-08-18 ENCOUNTER — Ambulatory Visit (INDEPENDENT_AMBULATORY_CARE_PROVIDER_SITE_OTHER): Payer: No Typology Code available for payment source | Admitting: Family Medicine

## 2020-08-18 ENCOUNTER — Telehealth: Payer: Self-pay

## 2020-08-18 ENCOUNTER — Encounter: Payer: Self-pay | Admitting: Family Medicine

## 2020-08-18 VITALS — BP 111/68 | HR 67 | Temp 98.4°F | Ht 64.0 in | Wt 174.0 lb

## 2020-08-18 DIAGNOSIS — N926 Irregular menstruation, unspecified: Secondary | ICD-10-CM

## 2020-08-18 DIAGNOSIS — T50Z95A Adverse effect of other vaccines and biological substances, initial encounter: Secondary | ICD-10-CM

## 2020-08-18 LAB — EXTRA LAV TOP TUBE

## 2020-08-18 LAB — POCT URINE PREGNANCY: Preg Test, Ur: NEGATIVE

## 2020-08-18 LAB — HCG, QUANTITATIVE, PREGNANCY: HCG, Total, QN: 3 m[IU]/mL

## 2020-08-18 NOTE — Progress Notes (Signed)
Subjective:    Patient ID: Christine Herring, female    DOB: [birth year], 34 y.o.   MRN: [id number]  Christine Herring is a 34 y.o. female presenting on 08/18/2020 for Possible Pregnancy and Covid Vaccine    HPI  Patient presents to clinic with concerns of possible pregnancy, reports she has had a BTL approximately 8 years ago, she had taken a few pregnancy tests earlier today, one with a possible faint positive.  Has concerns for abnormal menstrual cycle after receiving her COVID vaccine with some spotting, breast tenderness and intermittent cramping.  Has concerns for COVID vaccine reaction after her first dose.  Reports less than 10 minutes from the Pfizer 1st injection she had a low blood pressure and low heart rate which slowly spontaneously resolved while at the injection clinic.  Depression screen [facility] 2/9 08/08/2020 02/22/2019 09/11/2018  Decreased Interest 1 0 0  Down, Depressed, Hopeless 1 0 0  PHQ - 2 Score 2 0 0  Altered sleeping 2 1 -  Tired, decreased energy 2 2 -  Change in appetite 1 0 -  Feeling bad or failure about yourself  2 0 -  Trouble concentrating 1 1 -  Moving slowly or fidgety/restless 0 0 -  Suicidal thoughts 0 0 -  PHQ-9 Score 10 4 -  Difficult doing work/chores Somewhat difficult Not difficult at all -    Social History   Tobacco Use  . Smoking status: Former Smoker    Packs/day: 0.50    Years: 10.00    Pack years: 5.00    Quit date: 07/16/2005    Years since quitting: 15.1  . Smokeless tobacco: Never Used  Vaping Use  . Vaping Use: Never used  Substance Use Topics  . Alcohol use: Yes    Comment: social  . Drug use: No    Review of Systems  Constitutional: Negative.   HENT: Negative.   Eyes: Negative.   Respiratory: Negative.   Cardiovascular: Negative.   Gastrointestinal: Negative.   Endocrine: Negative.   Genitourinary: Positive for menstrual problem. Negative for decreased urine volume, difficulty urinating, dyspareunia, dysuria, enuresis, flank  pain, frequency, genital sores, hematuria, pelvic pain, urgency, vaginal bleeding, vaginal discharge and vaginal pain.  Musculoskeletal: Negative.   Skin: Negative.   Allergic/Immunologic: Negative.   Neurological: Negative.   Hematological: Negative.   Psychiatric/Behavioral: Negative.    Per HPI unless specifically indicated above     Objective:    BP 111/68 (BP Location: Left Arm, Patient Position: Sitting, Cuff Size: Normal)   Pulse 67   Temp 98.4 F (36.9 C) (Oral)   Ht 5\' 4"  (1.626 m)   Wt 174 lb (78.9 kg)   BMI 29.87 kg/m   Wt Readings from Last 3 Encounters:  08/18/20 174 lb (78.9 kg)  08/08/20 181 lb 3.2 oz (82.2 kg)  02/22/19 170 lb 12.8 oz (77.5 kg)    Physical Exam Vitals reviewed.  Constitutional:      General: She is not in acute distress.    Appearance: Normal appearance. She is well-developed, well-groomed and overweight. She is not ill-appearing or toxic-appearing.  HENT:     Head: Normocephalic and atraumatic.     Nose:     Comments: 04/24/19 is in place, covering mouth and nose. Eyes:     General: Lids are normal. Vision grossly intact.        Right eye: No discharge.        Left eye: No discharge.     Extraocular Movements:  Extraocular movements intact.     Conjunctiva/sclera: Conjunctivae normal.     Pupils: Pupils are equal, round, and reactive to light.  Cardiovascular:     Rate and Rhythm: Normal rate and regular rhythm.     Pulses: Normal pulses.     Heart sounds: Normal heart sounds. No murmur heard.  No friction rub. No gallop.   Pulmonary:     Effort: Pulmonary effort is normal. No respiratory distress.     Breath sounds: Normal breath sounds.  Musculoskeletal:     Right lower leg: No edema.     Left lower leg: No edema.  Skin:    General: Skin is warm and dry.     Capillary Refill: Capillary refill takes less than 2 seconds.  Neurological:     General: No focal deficit present.     Mental Status: She is alert and oriented to  person, place, and time.  Psychiatric:        Attention and Perception: Attention and perception normal.        Mood and Affect: Mood and affect normal.        Speech: Speech normal.        Behavior: Behavior normal. Behavior is cooperative.        Thought Content: Thought content normal.        Cognition and Memory: Cognition and memory normal.        Judgment: Judgment normal.    Results for orders placed or performed in visit on 08/18/20  B-HCG Quant  Result Value Ref Range   HCG, Total, QN 3 mIU/mL  EXTRA LAV TOP TUBE  Result Value Ref Range   EXTRA LAVENDER-TOP TUBE    POCT urine pregnancy  Result Value Ref Range   Preg Test, Ur Negative Negative      Assessment & Plan:   Problem List Items Addressed This Visit      Other   Abnormal menses - Primary    Discussed some menses abnormalities can happen after viral infection and/or immunization.  POCT urine pregnancy test negative in clinic.  Will send labs for Beta HCG quant.  ER precautions provided.  Plan: 1. BETA HCG Quant drawn and sent to lab 2. RTC after labs      Relevant Orders   POCT urine pregnancy (Completed)   B-HCG Quant (Completed)   Immunization reaction    Reported hypotension and bradycardia after 1st dose of Pfizer COVID vaccine within 10 minutes of injection.  Had spontaneous resolution of symptoms while at immunization site.  Plan: 1. Plan to increase water and sodium intake few days before second dose of COVID vaccine 2. Notify COVID immunization team of previous hypotension and bradycardia         No orders of the defined types were placed in this encounter.   Follow up plan: Return if symptoms worsen or fail to improve.   Charlaine Dalton, FNP Family Nurse Practitioner [facility] 08/18/2020, 3:57 PM

## 2020-08-18 NOTE — Patient Instructions (Signed)
We will send your blood to the lab and contact you when we receive the results.  If you begin to have severe abdominal cramping, heavy menstrual bleeding or fevers, to go to the emergency room immediately!  I would encourage you to increase your sodium before your next COVID vaccine to help increase your blood pressure, since you had a hypotensive episode after receiving the vaccine.  We will plan to see you back if your symptoms worsen or fail to improve  You will receive a survey after today's visit either digitally by e-mail or paper by USPS mail. Your experiences and feedback matter to [place].  Please respond so we know how we are doing as we provide care for you.  Call us with any questions/concerns/needs.  It is my goal to be available to you for your health concerns.  Thanks for choosing me to be a partner in your healthcare needs!  Charlaine Dalton, FNP-C Family Nurse Practitioner [facility] Phone: [phone]

## 2020-08-18 NOTE — Telephone Encounter (Signed)
Copied from CRM [id number]. Topic: General - Other >> Aug 18, 2020 12:47 PM Lyn Hollingshead D wrote: Positive pregnancy test seeking advise   Attempted to contact the patient to f/u on her concerns, no answer. I lmom to call the office back so we can schedule her an appt so we can confirm pregnancy.

## 2020-08-18 NOTE — Telephone Encounter (Signed)
If we can work her in for a urine pregnancy test, that would work.  If she is established with an OB/GYN, she can always contact them and they would work up just the same.  TY

## 2020-08-18 NOTE — Telephone Encounter (Signed)
The pt was added on the schedule for today.

## 2020-08-21 DIAGNOSIS — N926 Irregular menstruation, unspecified: Secondary | ICD-10-CM | POA: Insufficient documentation

## 2020-08-21 DIAGNOSIS — T50Z95A Adverse effect of other vaccines and biological substances, initial encounter: Secondary | ICD-10-CM | POA: Insufficient documentation

## 2020-08-21 NOTE — Assessment & Plan Note (Signed)
Discussed some menses abnormalities can happen after viral infection and/or immunization.  POCT urine pregnancy test negative in clinic.  Will send labs for Beta HCG quant.  ER precautions provided.  Plan: 1. BETA HCG Quant drawn and sent to lab 2. RTC after labs

## 2020-08-21 NOTE — Assessment & Plan Note (Signed)
Reported hypotension and bradycardia after 1st dose of Pfizer COVID vaccine within 10 minutes of injection.  Had spontaneous resolution of symptoms while at immunization site.  Plan: 1. Plan to increase water and sodium intake few days before second dose of COVID vaccine 2. Notify COVID immunization team of previous hypotension and bradycardia

## 2020-09-05 ENCOUNTER — Encounter: Payer: Self-pay | Admitting: Family Medicine

## 2020-09-07 ENCOUNTER — Other Ambulatory Visit (HOSPITAL_COMMUNITY)
Admission: RE | Admit: 2020-09-07 | Discharge: 2020-09-07 | Disposition: A | Discharge status: Home / Self Care | Payer: No Typology Code available for payment source | Source: Ambulatory Visit | Attending: Family Medicine | Admitting: Family Medicine

## 2020-09-07 ENCOUNTER — Ambulatory Visit (INDEPENDENT_AMBULATORY_CARE_PROVIDER_SITE_OTHER): Payer: No Typology Code available for payment source | Admitting: Family Medicine

## 2020-09-07 ENCOUNTER — Other Ambulatory Visit: Payer: Self-pay

## 2020-09-07 ENCOUNTER — Encounter: Payer: Self-pay | Admitting: Family Medicine

## 2020-09-07 VITALS — BP 87/51 | HR 62 | Temp 98.2°F | Resp 17 | Ht 64.0 in | Wt 177.8 lb

## 2020-09-07 DIAGNOSIS — Z124 Encounter for screening for malignant neoplasm of cervix: Secondary | ICD-10-CM | POA: Insufficient documentation

## 2020-09-07 DIAGNOSIS — Z01419 Encounter for gynecological examination (general) (routine) without abnormal findings: Secondary | ICD-10-CM | POA: Diagnosis not present

## 2020-09-07 NOTE — Progress Notes (Signed)
Subjective:    Patient ID: Christine Herring, female    DOB: 1986/07/27, 34 y.o.   MRN: [id number]  Christine Herring is a 34 y.o. female presenting on 09/07/2020 for Annual Exam   HPI  HEALTH MAINTENANCE:  Weight/BMI: Obese, BMI 30.52% Physical activity: Stays active Diet: Regular Seatbelt: Always Sunscreen: Yes PAP: Due, completed today HIV & Hep C Screening: Offered and declined GC/CT: Offered and declined Optometry: Yearly Dentistry: Yearly  IMMUNIZATIONS: Influenza: Due, Will get this year Tetanus: Up to date, 06/16/2012 COVID: Up to date, Pfizer 08/10/2020, 08/31/2020  Depression screen [facility] 2/9 08/08/2020 02/22/2019 09/11/2018  Decreased Interest 1 0 0  Down, Depressed, Hopeless 1 0 0  PHQ - 2 Score 2 0 0  Altered sleeping 2 1 -  Tired, decreased energy 2 2 -  Change in appetite 1 0 -  Feeling bad or failure about yourself  2 0 -  Trouble concentrating 1 1 -  Moving slowly or fidgety/restless 0 0 -  Suicidal thoughts 0 0 -  PHQ-9 Score 10 4 -  Difficult doing work/chores Somewhat difficult Not difficult at all -    Past Medical History:  Diagnosis Date  . Anxiety   . Hip dysplasia   . Osteoporosis   . Seizures (HCC)    when she was a child reason not know  . Thyroid disease    Past Surgical History:  Procedure Laterality Date  . TOTAL HIP ARTHROPLASTY Right   . TUBAL LIGATION     Social History   Socioeconomic History  . Marital status: Single    Spouse name: Not on file  . Number of children: Not on file  . Years of education: Not on file  . Highest education level: Not on file  Occupational History  . Not on file  Tobacco Use  . Smoking status: Former Smoker    Packs/day: 0.50    Years: 10.00    Pack years: 5.00    Quit date: 07/16/2005    Years since quitting: 15.1  . Smokeless tobacco: Never Used  Vaping Use  . Vaping Use: Never used  Substance and Sexual Activity  . Alcohol use: Yes    Comment: social  . Drug use: No  . Sexual activity: Yes     Birth control/protection: Surgical  Other Topics Concern  . Not on file  Social History Narrative  . Not on file   Social Determinants of Health   Financial Resource Strain:   . Difficulty of Paying Living Expenses: Not on file  Food Insecurity:   . Worried About Programme researcher, broadcasting/film/video in the Last Year: Not on file  . Ran Out of Food in the Last Year: Not on file  Transportation Needs:   . Lack of Transportation (Medical): Not on file  . Lack of Transportation (Non-Medical): Not on file  Physical Activity:   . Days of Exercise per Week: Not on file  . Minutes of Exercise per Session: Not on file  Stress:   . Feeling of Stress : Not on file  Social Connections:   . Frequency of Communication with Friends and Family: Not on file  . Frequency of Social Gatherings with Friends and Family: Not on file  . Attends Religious Services: Not on file  . Active Member of Clubs or Organizations: Not on file  . Attends Banker Meetings: Not on file  . Marital Status: Not on file  Intimate Partner Violence:   . Fear of Current  or Ex-Partner: Not on file  . Emotionally Abused: Not on file  . Physically Abused: Not on file  . Sexually Abused: Not on file   Family History  Problem Relation Age of Onset  . Thyroid disease Mother   . Breast cancer Maternal Aunt   . Colon cancer Maternal Aunt        grt   . Ovarian cancer Paternal Grandmother   . Diabetes Neg Hx   . Heart disease Neg Hx    Current Outpatient Medications on File Prior to Visit  Medication Sig  . Calcium Carb-Cholecalciferol 500-200 MG-UNIT TABS Take 2 tablets by mouth daily.  . Emollient (COLLAGEN EX) Apply topically.  . Ferrous Sulfate (IRON) 325 (65 Fe) MG TABS Take by mouth.  . levothyroxine (SYNTHROID) 75 MCG tablet Take 1 tablet (75 mcg total) by mouth daily.  Marland Kitchen. loratadine (CLARITIN) 10 MG tablet Take 10 mg by mouth daily.  . Multiple Vitamin (MULTIVITAMIN) tablet Take 1 tablet by mouth daily.  Marland Kitchen.  albuterol (PROVENTIL HFA;VENTOLIN HFA) 108 (90 Base) MCG/ACT inhaler Inhale 2 puffs into the lungs every 6 (six) hours as needed for wheezing or shortness of breath. (Patient not taking: Reported on 08/18/2020)   No current facility-administered medications on file prior to visit.    Per HPI unless specifically indicated above     Objective:    BP (!) 87/51 (BP Location: Left Arm, Patient Position: Sitting, Cuff Size: Normal)   Pulse 62   Temp 98.2 F (36.8 C) (Oral)   Resp 17   Ht 5\' 4"  (1.626 m)   Wt 177 lb 12.8 oz (80.6 kg)   LMP 08/18/2020   SpO2 100%   BMI 30.52 kg/m   Wt Readings from Last 3 Encounters:  09/07/20 177 lb 12.8 oz (80.6 kg)  08/18/20 174 lb (78.9 kg)  08/08/20 181 lb 3.2 oz (82.2 kg)    Physical Exam Vitals reviewed.  Constitutional:      General: She is not in acute distress.    Appearance: Normal appearance. She is well-developed and well-groomed. She is obese. She is not ill-appearing or toxic-appearing.  HENT:     Head: Normocephalic and atraumatic.     Nose:     Comments: Lesia SagoFacemask is in place, covering mouth and nose. Eyes:     General: Lids are normal. Vision grossly intact. No scleral icterus.       Right eye: No discharge.        Left eye: No discharge.     Extraocular Movements: Extraocular movements intact.     Conjunctiva/sclera: Conjunctivae normal.     Pupils: Pupils are equal, round, and reactive to light.  Cardiovascular:     Pulses: Normal pulses.          Dorsalis pedis pulses are 2+ on the right side and 2+ on the left side.  Pulmonary:     Effort: Pulmonary effort is normal. No respiratory distress.  Abdominal:     General: Abdomen is flat.     Palpations: Abdomen is soft. There is no hepatomegaly or splenomegaly.     Tenderness: There is no abdominal tenderness.     Hernia: There is no hernia in the left inguinal area or right inguinal area.  Genitourinary:    General: Normal vulva.     Exam position: Lithotomy position.      Pubic Area: No rash or pubic lice.      Labia:        Right: No rash,  tenderness, lesion or injury.        Left: No rash, tenderness, lesion or injury.      Urethra: No urethral pain, urethral swelling or urethral lesion.     Vagina: Normal. No signs of injury and foreign body. No vaginal discharge, erythema, tenderness, bleeding or lesions.     Cervix: No cervical motion tenderness, discharge, friability, lesion, erythema, cervical bleeding or eversion.     Uterus: Normal. Not enlarged and not tender.      Adnexa: Right adnexa normal and left adnexa normal.  Musculoskeletal:     Right lower leg: No edema.     Left lower leg: No edema.  Feet:     Right foot:     Skin integrity: Skin integrity normal.     Left foot:     Skin integrity: Skin integrity normal.  Lymphadenopathy:     Lower Body: No right inguinal adenopathy. No left inguinal adenopathy.  Skin:    General: Skin is warm and dry.     Capillary Refill: Capillary refill takes less than 2 seconds.  Neurological:     General: No focal deficit present.     Mental Status: She is alert and oriented to person, place, and time.     Cranial Nerves: No cranial nerve deficit.     Sensory: No sensory deficit.     Motor: No weakness.     Coordination: Coordination normal.     Gait: Gait normal.  Psychiatric:        Attention and Perception: Attention and perception normal.        Mood and Affect: Mood and affect normal.        Speech: Speech normal.        Behavior: Behavior normal. Behavior is cooperative.        Thought Content: Thought content normal.        Cognition and Memory: Cognition and memory normal.        Judgment: Judgment normal.     Results for orders placed or performed in visit on 08/18/20  B-HCG Quant  Result Value Ref Range   HCG, Total, QN 3 mIU/mL  EXTRA LAV TOP TUBE  Result Value Ref Range   EXTRA LAVENDER-TOP TUBE    POCT urine pregnancy  Result Value Ref Range   Preg Test, Ur Negative Negative       Assessment & Plan:   Problem List Items Addressed This Visit      Other   Well woman exam with routine gynecological exam - Primary    Well woman exam without new findings.  Well adult with no acute concerns.  Plan: 1. Obtain health maintenance screenings as above according to age. - Increase physical activity to 30 minutes most days of the week.  - Eat healthy diet high in vegetables and fruits; low in refined carbohydrates. - Screening labs and tests as ordered 2. Return 1 year for annual physical.       Cervical cancer screening    Last PAP testing 09/17/2016.  Result NIL and not tested for HPV.  Due for PAP testing.  Plan: 1. PAP testing completed and sent to lab for evaluation       Relevant Orders   Cytology - PAP      No orders of the defined types were placed in this encounter.   Follow up plan: Return in about 6 months (around 03/07/2021) for Thyroid follow up visit.  Charlaine Dalton, FNP-C Family Nurse Practitioner  [facility] 09/07/2020, 12:18 PM

## 2020-09-07 NOTE — Assessment & Plan Note (Signed)
Well woman exam without new findings.  Well adult with no acute concerns.  Plan: 1. Obtain health maintenance screenings as above according to age. - Increase physical activity to 30 minutes most days of the week.  - Eat healthy diet high in vegetables and fruits; low in refined carbohydrates. - Screening labs and tests as ordered 2. Return 1 year for annual physical.  

## 2020-09-07 NOTE — Patient Instructions (Signed)
We have sent your PAP to the lab for testing.  Will contact you once the results are received.  Well Visit: Care Instructions Overview  Well visits can help you stay healthy. Your provider has checked your overall health and may have suggested ways to take good care of yourself. Your provider also may have recommended tests. At home, you can help prevent illness with healthy eating, regular exercise, and other steps.  Follow-up care is a key part of your treatment and safety. Be sure to make and go to all appointments, and call your provider if you are having problems. It's also a good idea to know your test results and keep a list of the medicines you take.  How can you care for yourself at home?   Get screening tests that you and your doctor decide on. Screening helps find diseases before any symptoms appear.   Eat healthy foods. Choose fruits, vegetables, whole grains, protein, and low-fat dairy foods. Limit fat, especially saturated fat. Reduce salt in your diet.   Limit alcohol. If you are a man, have no more than 2 drinks a day or 14 drinks a week. If you are a woman, have no more than 1 drink a day or 7 drinks a week.   Get at least 30 minutes of physical activity on most days of the week.  We recommend you go no more than 2 days in a row without exercise. Walking is a good choice. You also may want to do other activities, such as running, swimming, cycling, or playing tennis or team sports. Discuss any changes in your exercise program with your provider.   Reach and stay at a healthy weight. This will lower your risk for many problems, such as obesity, diabetes, heart disease, and high blood pressure.   Do not smoke or allow others to smoke around you. If you need help quitting, talk to your provider about stop-smoking programs and medicines. These can increase your chances of quitting for good.  Can call 1-800-QUIT-NOW (1-800-784-8669) for the [place] Quitline, assistance  with smoking cessation.   Care for your mental health. It is easy to get weighed down by worry and stress. Learn strategies to manage stress, like deep breathing and mindfulness, and stay connected with your family and community. If you find you often feel sad or hopeless, talk with your provider. Treatment can help.   Talk to your provider about whether you have any risk factors for sexually transmitted infections (STIs). You can help prevent STIs if you wait to have sex with a new partner (or partners) until you've each been tested for STIs. It also helps if you use condoms (female or female condoms) and if you limit your sex partners to one person who only has sex with you. Vaccines are available for some STIs, such as HPV (these are age dependent).   Use birth control if it's important to you to prevent pregnancy. Talk with your provider about the choices available and what might be best for you.   If you think you may have a problem with alcohol or drug use, talk to your provider. This includes prescription medicines (such as amphetamines and opioids) and illegal drugs (such as cocaine and methamphetamine). Your provider can help you figure out what type of treatment is best for you.   If you have concerns about domestic violence or intimate partner violence, there are resources available to you. National Domestic Abuse Hotline 1-800-7233   Protect your skin   from too much sun. When you're outdoors from 10 a.m. to 4 p.m., stay in the shade or cover up with clothing and a hat with a wide brim. Wear sunglasses that block UV rays. Even when it's cloudy, put broad-spectrum sunscreen (SPF 30 or higher) on any exposed skin.   See a dentist one or two times a year for checkups and to have your teeth cleaned.   See an eye doctor once per year for an eye exam.   Wear a seat belt in the car.  When should you call for help?  Watch closely for changes in your health, and be sure to contact your  provider if you have any problems or symptoms that concern you.  We will plan to see you back in 6 months for thyroid follow up visit  You will receive a survey after today's visit either digitally by e-mail or paper by USPS mail. Your experiences and feedback matter to [place].  Please respond so we know how we are doing as we provide care for you.  Call us with any questions/concerns/needs.  It is my goal to be available to you for your health concerns.  Thanks for choosing me to be a partner in your healthcare needs!  Charlaine Dalton, FNP-C Family Nurse Practitioner [facility] Phone: [phone]

## 2020-09-07 NOTE — Assessment & Plan Note (Signed)
Last PAP testing 09/17/2016.  Result NIL and not tested for HPV.  Due for PAP testing.  Plan: 1. PAP testing completed and sent to lab for evaluation

## 2020-09-11 LAB — CYTOLOGY - PAP
Comment: NEGATIVE
Diagnosis: NEGATIVE
High risk HPV: NEGATIVE

## 2020-09-14 ENCOUNTER — Encounter: Payer: Self-pay | Admitting: Obstetrics and Gynecology

## 2020-11-08 ENCOUNTER — Other Ambulatory Visit: Payer: Self-pay | Admitting: Family Medicine

## 2020-11-08 DIAGNOSIS — I9589 Other hypotension: Secondary | ICD-10-CM

## 2020-11-08 MED ORDER — LEVOTHYROXINE SODIUM 75 MCG PO TABS: 75.0000 ug | ORAL_TABLET | Freq: Every day | ORAL | 0 refills | Status: DC

## 2020-11-08 NOTE — Telephone Encounter (Signed)
Medication Refill - Medication: levothyroxine (SYNTHROID) 75 MCG tablet ° °Pt is completely out of current supply  ° °Has the patient contacted their pharmacy? Yes.   °(Agent: If no, request that the patient contact the pharmacy for the refill.) °(Agent: If yes, when and what did the pharmacy advise?) ° °Preferred Pharmacy (with phone number or street name):  °[id number] - [place], [place] - [address]  °[address] [place] [address]  °Phone: [phone] Fax: [phone]  ° ° ° °Agent: Please be advised that RX refills may take up to 3 business days. We ask that you follow-up with your pharmacy. ° °

## 2020-11-08 NOTE — Telephone Encounter (Signed)
Last TSH lab 08/08/20

## 2020-11-23 ENCOUNTER — Encounter: Payer: Self-pay | Admitting: Family Medicine

## 2021-01-17 ENCOUNTER — Telehealth: Payer: Self-pay | Admitting: Family Medicine

## 2021-01-17 NOTE — Telephone Encounter (Signed)
Called to confirm that they office received a fax requesting medical records of patient.  Please call to confirm at [phone]

## 2021-02-13 ENCOUNTER — Other Ambulatory Visit: Payer: Self-pay | Admitting: Family Medicine

## 2021-02-13 DIAGNOSIS — I9589 Other hypotension: Secondary | ICD-10-CM

## 2021-02-13 MED ORDER — LEVOTHYROXINE SODIUM 75 MCG PO TABS: 75.0000 ug | ORAL_TABLET | Freq: Every day | ORAL | 0 refills | Status: DC

## 2021-02-13 NOTE — Telephone Encounter (Signed)
Medication Refill - Medication: levothyroxine (SYNTHROID) 75 MCG tablet  Pt is completely out of current supply   Has the patient contacted their pharmacy? Yes.   (Agent: If no, request that the patient contact the pharmacy for the refill.) (Agent: If yes, when and what did the pharmacy advise?)  Preferred Pharmacy (with phone number or street name):  [id number] [place], [place] - [address]  [address] [place] [address]  Phone: [phone] Fax: [phone]     Agent: Please be advised that RX refills may take up to 3 business days. We ask that you follow-up with your pharmacy.

## 2021-02-13 NOTE — Telephone Encounter (Signed)
Attempted to call patient to schedule follow up lab- overdue.Left message for patient to call office to schedule.  Courtesy RF #[phone] given

## 2021-03-07 ENCOUNTER — Telehealth: Payer: Self-pay | Admitting: Family Medicine

## 2021-03-07 DIAGNOSIS — I9589 Other hypotension: Secondary | ICD-10-CM

## 2021-03-07 NOTE — Telephone Encounter (Signed)
Requested medication (s) are due for refill today:   Yes  Requested medication (s) are on the active medication list:   Yes  Future visit scheduled:   No  Has been notified to call in for appt.     Last ordered: 02/13/2021 #30, 0 refills by Dr. Althea Charon  He gave her a 30 day courtesy refill in Feb.   Provider to review for refill   Requested Prescriptions  Pending Prescriptions Disp Refills   levothyroxine (SYNTHROID) 75 MCG tablet [Pharmacy Med Name: LEVOTHYROXINE 75 MCG TABLET] 30 tablet 0    Sig: TAKE 1 TABLET BY MOUTH EVERY DAY      Endocrinology:  Hypothyroid Agents Failed - 03/07/2021  1:33 PM      Failed - TSH needs to be rechecked within 3 months after an abnormal result. Refill until TSH is due.      Failed - TSH in normal range and within 360 days    TSH  Date Value Ref Range Status  08/08/2020 0.28 (L) mIU/L Final    Comment:              Reference Range .           > or = 20 Years  0.40-4.50 .                Pregnancy Ranges           First trimester    0.26-2.66           Second trimester   0.55-2.73           Third trimester    0.43-2.91           Passed - Valid encounter within last 12 months    Recent Outpatient Visits           6 months ago Well woman exam with routine gynecological exam   [facility], Jodelle Gross, FNP   6 months ago Abnormal menses   [facility], Jodelle Gross, FNP   7 months ago Hypothyroidism due to acquired atrophy of thyroid   [facility], Jodelle Gross, FNP   2 years ago Hypothyroidism due to acquired atrophy of thyroid   [facility] Galen Manila, NP   2 years ago Hypothyroidism due to acquired atrophy of thyroid   [facility] Galen Manila, NP

## 2021-03-19 NOTE — Telephone Encounter (Signed)
Pt well overdue repeat labs for thyroid check. She have also been given a courtesy refill. She was notified that she must be seen in the office prior to getting anymore refills on this medication. Appt scheduled for tomorrow at 10:40am.

## 2021-03-19 NOTE — Telephone Encounter (Signed)
Pt calling again to have this medication refilled. She states that she is completely out of this medication and that she has been seeing PCP. Please advise.

## 2021-03-20 ENCOUNTER — Encounter: Payer: Self-pay | Admitting: Family Medicine

## 2021-03-20 ENCOUNTER — Other Ambulatory Visit: Payer: Self-pay

## 2021-03-20 ENCOUNTER — Ambulatory Visit (INDEPENDENT_AMBULATORY_CARE_PROVIDER_SITE_OTHER): Payer: No Typology Code available for payment source | Admitting: Family Medicine

## 2021-03-20 VITALS — BP 93/57 | HR 64 | Ht 64.0 in | Wt 175.8 lb

## 2021-03-20 DIAGNOSIS — I9589 Other hypotension: Secondary | ICD-10-CM

## 2021-03-20 DIAGNOSIS — E034 Atrophy of thyroid (acquired): Secondary | ICD-10-CM

## 2021-03-20 MED ORDER — LEVOTHYROXINE SODIUM 75 MCG PO TABS: 75.0000 ug | ORAL_TABLET | Freq: Every day | ORAL | 2 refills | Status: DC

## 2021-03-20 NOTE — Addendum Note (Signed)
Addended by: Smitty Cords on: 03/20/2021 01:11 PM   Modules accepted: Level of Service

## 2021-03-20 NOTE — Progress Notes (Signed)
Subjective:    Patient ID: Christine Herring, female    DOB: 1986/11/17, 35 y.o.   MRN: [id number]  Christine Herring is a 35 y.o. female presenting on 03/20/2021 for Hypothyroidism  Previous PCP Danielle Rankin, FNP   HPI   Hypothyroidism Previously on Levothyroxine daily in past Last labs 07/2020, TSH 0.28, and T4 14.4 elevated, previous PCP reduced her dose of Levothyroxine from 100 down to . She had some slight increased hair loss before falling out more then it improved after dosage change of Levothyroxine. Admits random headaches, episodic - worse after Oct / Nov 2021.  Hypotension History of chronic low BP 80-90 / 50-70s, she has rare episode of lightheadedness in past - very rare episode. She normally feels fine without issue on blood pressure. Her prior PCP asked her to take in more salt sodium and maintain hydration. Admits some episodic bloating and swelling, unsure if something that she has eaten. She has family history of low blood pressure with mother.    Depression screen [facility] 2/9 08/08/2020 02/22/2019 09/11/2018  Decreased Interest 1 0 0  Down, Depressed, Hopeless 1 0 0  PHQ - 2 Score 2 0 0  Altered sleeping 2 1 -  Tired, decreased energy 2 2 -  Change in appetite 1 0 -  Feeling bad or failure about yourself  2 0 -  Trouble concentrating 1 1 -  Moving slowly or fidgety/restless 0 0 -  Suicidal thoughts 0 0 -  PHQ-9 Score 10 4 -  Difficult doing work/chores Somewhat difficult Not difficult at all -    Social History   Tobacco Use  . Smoking status: Former Smoker    Packs/day: 0.50    Years: 10.00    Pack years: 5.00    Quit date: 07/16/2005    Years since quitting: 15.6  . Smokeless tobacco: Never Used  Vaping Use  . Vaping Use: Never used  Substance Use Topics  . Alcohol use: Yes    Comment: social  . Drug use: No    Review of Systems Per HPI unless specifically indicated above     Objective:    BP (!) 93/57   Pulse 64   Ht 5\' 4"  (1.626 m)    Wt 175 lb 12.8 oz (79.7 kg)   SpO2 100%   BMI 30.18 kg/m   Wt Readings from Last 3 Encounters:  03/20/21 175 lb 12.8 oz (79.7 kg)  09/07/20 177 lb 12.8 oz (80.6 kg)  08/18/20 174 lb (78.9 kg)    Physical Exam Vitals and nursing note reviewed.  Constitutional:      General: She is not in acute distress.    Appearance: She is well-developed. She is not diaphoretic.     Comments: Well-appearing, comfortable, cooperative  HENT:     Head: Normocephalic and atraumatic.  Eyes:     General:        Right eye: No discharge.        Left eye: No discharge.     Conjunctiva/sclera: Conjunctivae normal.  Cardiovascular:     Rate and Rhythm: Normal rate.  Pulmonary:     Effort: Pulmonary effort is normal.  Skin:    General: Skin is warm and dry.     Findings: No erythema or rash.  Neurological:     Mental Status: She is alert and oriented to person, place, and time.  Psychiatric:        Behavior: Behavior normal.     Comments: Well groomed,  good eye contact, normal speech and thoughts       Results for orders placed or performed in visit on 09/07/20  Cytology - PAP  Result Value Ref Range   High risk HPV Negative    Adequacy      Satisfactory for evaluation; transformation zone component PRESENT.   Diagnosis      - Negative for intraepithelial lesion or malignancy (NILM)   Comment Normal Reference Range HPV - Negative       Assessment & Plan:   Problem List Items Addressed This Visit    Hypothyroidism - Primary   Relevant Medications   levothyroxine (SYNTHROID) 75 MCG tablet   Other Relevant Orders   TSH   T4, free   BASIC METABOLIC PANEL WITH GFR   Lipid panel   Chronic hypotension   Relevant Medications   levothyroxine (SYNTHROID) 75 MCG tablet   Other Relevant Orders   BASIC METABOLIC PANEL WITH GFR     Stable chronic hypotension Almost always asymptomatic. Very rare occurrence lightheaded, seems only when poor nutrition, miss meal or not eat / hydrated Fam  history of hypotension as well Maintains on some extra sodium at times to help  Hypothyroidism Previously controlled except last reading 07/2020 had elevated thyroid hormone, reduced from 100 to 75 Now missed last 2 doses of thyroid pill ran out, new refills ordered TODAY - levothyroxine daily 30 day with refills She should resume med and return for fasting lab check 4 weeks to see if thyroid normalized, may need to adjust again possibly up to 88 if need or continue current therapy.  Discussed dietary impact on fluid / pressure    Orders Placed This Encounter  Procedures  . TSH    Standing Status:   Future    Standing Expiration Date:   06/22/2021  . T4, free    Standing Status:   Future    Standing Expiration Date:   06/22/2021  . BASIC METABOLIC PANEL WITH GFR    Standing Status:   Future    Standing Expiration Date:   06/22/2021  . Lipid panel    Standing Status:   Future    Standing Expiration Date:   03/20/2022    Order Specific Question:   Has the patient fasted?    Answer:   Yes     Meds ordered this encounter  Medications  . levothyroxine (SYNTHROID) 75 MCG tablet    Sig: Take 1 tablet (75 mcg total) by mouth daily before breakfast.    Dispense:  30 tablet    Refill:  2     Follow up plan: Return in about 4 weeks (around 04/17/2021) for 4 weeks fasting lab only then can follow-up routine w/ Rene Kocher in future few months later Thyroid.   Saralyn Pilar, DO [facility] 03/20/2021, 11:01 AM

## 2021-03-20 NOTE — Patient Instructions (Addendum)
Thank you for coming to the office today.  Resume Levothyroxine daily, before breakfast, 2 refills added.  We can check  Labs in 4 weeks to make sure it is consistent with current thyroid dose  Also we will check cholesterol.  Try to keep on sodium balanced diet, we can keep with higher than average to help maintain fluid and blood pressure going forward, try to pay attention to days you have swelling if it is related to sodium intake and reduced fluid intake and warmer weather etc.   DUE for FASTING BLOOD WORK (no food or drink after midnight before the lab appointment, only water or coffee without cream/sugar on the morning of)  SCHEDULE "Lab Only" visit in the morning at the clinic for lab draw in  4 weeks  - Make sure Lab Only appointment is at about 1 week before your next appointment, so that results will be available  For Lab Results, once available within 2-3 days of blood draw, you can can log in to MyChart online to view your results and a brief explanation. Also, we can discuss results at next follow-up visit.   Please schedule a Follow-up Appointment to: Return in about 4 weeks (around 04/17/2021) for 4 weeks fasting lab only then can follow-up routine w/ Rene Kocher in future few months later Thyroid.  If you have any other questions or concerns, please feel free to call the office or send a message through MyChart. You may also schedule an earlier appointment if necessary.  Additionally, you may be receiving a survey about your experience at our office within a few days to 1 week by e-mail or mail. We value your feedback.  Saralyn Pilar, DO [facility], [place]

## 2021-06-16 ENCOUNTER — Other Ambulatory Visit: Payer: Self-pay | Admitting: Family Medicine

## 2021-06-16 DIAGNOSIS — I9589 Other hypotension: Secondary | ICD-10-CM

## 2021-06-16 NOTE — Telephone Encounter (Signed)
Requested medications are due for refill today yes  Requested medications are on the active medication list yes  Last refill 3/29  Last visit 03/20/21  Notes to clinic OV note states to come back in 4 weeks to have labs drawn due to she ran out of Synthroid and labs would not be accurate. She did not have these labs drawn.

## 2021-06-19 ENCOUNTER — Other Ambulatory Visit: Payer: Self-pay

## 2021-06-19 ENCOUNTER — Other Ambulatory Visit: Payer: No Typology Code available for payment source

## 2021-06-19 DIAGNOSIS — E034 Atrophy of thyroid (acquired): Secondary | ICD-10-CM

## 2021-06-19 DIAGNOSIS — I9589 Other hypotension: Secondary | ICD-10-CM

## 2021-06-20 ENCOUNTER — Encounter: Payer: Self-pay | Admitting: Internal Medicine

## 2021-06-20 DIAGNOSIS — I9589 Other hypotension: Secondary | ICD-10-CM

## 2021-06-20 LAB — BASIC METABOLIC PANEL WITH GFR
BUN: 18 mg/dL (ref 7–25)
CO2: 27 mmol/L (ref 20–32)
Calcium: 8.7 mg/dL (ref 8.6–10.2)
Chloride: 106 mmol/L (ref 98–110)
Creat: 0.69 mg/dL (ref 0.50–1.10)
GFR, Est African American: 133 mL/min/{1.73_m2} (ref >=60)
GFR, Est Non African American: 114 mL/min/{1.73_m2} (ref >=60)
Glucose, Bld: 90 mg/dL (ref 65–99)
Potassium: 4.2 mmol/L (ref 3.5–5.3)
Sodium: 139 mmol/L (ref 135–146)

## 2021-06-20 LAB — LIPID PANEL
Cholesterol: 174 mg/dL (ref <200)
HDL: 42 mg/dL — ABNORMAL LOW (ref >=50)
LDL Cholesterol (Calc): 112 mg/dL (calc) — ABNORMAL HIGH
Non-HDL Cholesterol (Calc): 132 mg/dL (calc) — ABNORMAL HIGH (ref <130)
Total CHOL/HDL Ratio: 4.1 (calc) (ref <5.0)
Triglycerides: 97 mg/dL (ref <150)

## 2021-06-20 LAB — T4, FREE: Free T4: 1 ng/dL (ref 0.8–1.8)

## 2021-06-20 LAB — TSH: TSH: 6.7 mIU/L — ABNORMAL HIGH

## 2021-06-20 MED ORDER — LEVOTHYROXINE SODIUM 88 MCG PO TABS: 88.0000 ug | ORAL_TABLET | Freq: Every day | ORAL | 1 refills | Status: DC

## 2021-06-20 NOTE — Telephone Encounter (Signed)
Do you want to address this since you saw her for this. I have not yet seen her.

## 2021-12-13 ENCOUNTER — Telehealth: Payer: Self-pay | Admitting: Family Medicine

## 2021-12-13 DIAGNOSIS — I9589 Other hypotension: Secondary | ICD-10-CM

## 2021-12-13 NOTE — Telephone Encounter (Signed)
Requested medications are due for refill today.  yes  Requested medications are on the active medications list.  yes  Last refill. 06/20/2021  Future visit scheduled.   no  Notes to clinic.  Failed protocol d/t abnormal labs.    Requested Prescriptions  Pending Prescriptions Disp Refills   levothyroxine (SYNTHROID) 88 MCG tablet [Pharmacy Med Name: LEVOTHYROXINE 88 MCG TABLET] 90 tablet 1    Sig: TAKE 1 TABLET BY MOUTH DAILY BEFORE BREAKFAST.     Endocrinology:  Hypothyroid Agents Failed - 12/13/2021  1:27 AM      Failed - TSH needs to be rechecked within 3 months after an abnormal result. Refill until TSH is due.      Failed - TSH in normal range and within 360 days    TSH  Date Value Ref Range Status  06/19/2021 6.70 (H) mIU/L Final    Comment:              Reference Range .           > or = 20 Years  0.40-4.50 .                Pregnancy Ranges           First trimester    0.26-2.66           Second trimester   0.55-2.73           Third trimester    0.43-2.91           Passed - Valid encounter within last 12 months    Recent Outpatient Visits           8 months ago Hypothyroidism due to acquired atrophy of thyroid   [facility] Althea Charon, Netta Neat, DO   1 year ago Well woman exam with routine gynecological exam   [facility], Jodelle Gross, FNP   1 year ago Abnormal menses   [facility], Jodelle Gross, FNP   1 year ago Hypothyroidism due to acquired atrophy of thyroid   [facility], Jodelle Gross, FNP   2 years ago Hypothyroidism due to acquired atrophy of thyroid   [facility] [place], NP

## 2021-12-27 NOTE — Telephone Encounter (Signed)
Pt called in requesting an update on medication. Pt stated she is in between changing jobs and getting new insurance. Pt waiting for her new insurance to activate in February.   Pt requesting refill until she can can make an appointment. Pt stated she has 2 pills left.   [id number] [place], [place] - [address]  [place] [address]  Phone: [phone] Fax: [phone]  Hours: Not open 24 hours   Pt requested call back.   Please advise.

## 2021-12-28 MED ORDER — LEVOTHYROXINE SODIUM 88 MCG PO TABS: 88.0000 ug | ORAL_TABLET | Freq: Every day | ORAL | 0 refills | Status: DC

## 2021-12-28 NOTE — Addendum Note (Signed)
Addended by: Lorre Munroe on: 12/28/2021 11:47 AM   Modules accepted: Orders

## 2021-12-28 NOTE — Telephone Encounter (Signed)
Levothyroxine refilled. Must schedule appt in March for further refills.

## 2022-02-28 ENCOUNTER — Other Ambulatory Visit: Payer: Self-pay

## 2022-02-28 ENCOUNTER — Ambulatory Visit: Payer: No Typology Code available for payment source | Admitting: Internal Medicine

## 2022-02-28 ENCOUNTER — Encounter: Payer: Self-pay | Admitting: Internal Medicine

## 2022-02-28 ENCOUNTER — Ambulatory Visit: Payer: Commercial Managed Care - PPO | Admitting: Internal Medicine

## 2022-02-28 VITALS — BP 85/49 | HR 61 | Temp 97.5°F | Wt 172.0 lb

## 2022-02-28 DIAGNOSIS — E034 Atrophy of thyroid (acquired): Secondary | ICD-10-CM | POA: Diagnosis not present

## 2022-02-28 DIAGNOSIS — M162 Bilateral osteoarthritis resulting from hip dysplasia: Secondary | ICD-10-CM

## 2022-02-28 DIAGNOSIS — F419 Anxiety disorder, unspecified: Secondary | ICD-10-CM

## 2022-02-28 DIAGNOSIS — E7800 Pure hypercholesterolemia, unspecified: Secondary | ICD-10-CM | POA: Diagnosis not present

## 2022-02-28 DIAGNOSIS — R7309 Other abnormal glucose: Secondary | ICD-10-CM

## 2022-02-28 DIAGNOSIS — I9589 Other hypotension: Secondary | ICD-10-CM | POA: Diagnosis not present

## 2022-02-28 DIAGNOSIS — D500 Iron deficiency anemia secondary to blood loss (chronic): Secondary | ICD-10-CM | POA: Diagnosis not present

## 2022-02-28 DIAGNOSIS — R739 Hyperglycemia, unspecified: Secondary | ICD-10-CM

## 2022-02-28 DIAGNOSIS — M1990 Unspecified osteoarthritis, unspecified site: Secondary | ICD-10-CM | POA: Insufficient documentation

## 2022-02-28 DIAGNOSIS — Z6829 Body mass index (BMI) 29.0-29.9, adult: Secondary | ICD-10-CM

## 2022-02-28 DIAGNOSIS — E663 Overweight: Secondary | ICD-10-CM

## 2022-02-28 DIAGNOSIS — Z87898 Personal history of other specified conditions: Secondary | ICD-10-CM

## 2022-02-28 NOTE — Assessment & Plan Note (Signed)
Situational ?She manages this by working out ?Support offered ?We will monitor ?

## 2022-02-28 NOTE — Assessment & Plan Note (Signed)
Status post bilateral hip replacements ?She will continue Tylenol or Ibuprofen OTC as needed ?

## 2022-02-28 NOTE — Assessment & Plan Note (Signed)
Encourage diet and exercise for weight loss 

## 2022-02-28 NOTE — Assessment & Plan Note (Signed)
CBC and IBC panel today ?She will continue oral Iron ?

## 2022-02-28 NOTE — Patient Instructions (Signed)
Hypotension °As your heart beats, it forces blood through your body. This force is called blood pressure. If you have hypotension, you have low blood pressure.  °When your blood pressure is too low, you may not get enough blood to your brain or other parts of your body. This may cause you to feel weak, light-headed, have a fast heartbeat, or even faint. Low blood pressure may be harmless, or it may cause serious problems. °What are the causes? °Blood loss. °Not enough water in the body (dehydration). °Heart problems. °Hormone problems. °Pregnancy. °A very bad infection. °Not having enough of certain nutrients. °Very bad allergic reactions. °Certain medicines. °What increases the risk? °Age. The risk increases as you get older. °Conditions that affect the heart or the brain and spinal cord (central nervous system). °What are the signs or symptoms? °Feeling: °Weak. °Light-headed. °Dizzy. °Tired (fatigued). °Blurred vision. °Fast heartbeat. °Fainting, in very bad cases. °How is this treated? °Changing your diet. This may involve drinking more water or including more salt (sodium) in your diet by eating high-salt foods. °Taking medicines to raise your blood pressure. °Changing how much you take (the dosage) of some of your medicines. °Wearing compression stockings. These stockings help to prevent blood clots and reduce swelling in your legs. °In some cases, you may need to go to the hospital to: °Receive fluids through an IV tube. °Receive donated blood through an IV tube (transfusion). °Get treated for an infection or heart problems, if this applies. °Be monitored while medicines that you are taking wear off. °Follow these instructions at home: °Eating and drinking ° °Drink enough fluids to keep your pee (urine) pale yellow. °Eat a healthy diet. Follow instructions from your doctor about what you can eat or drink. A healthy diet includes: °Fresh fruits and vegetables. °Whole grains. °Low-fat (lean) meats. °Low-fat  dairy products. °If told, include more salt in your diet. Do not add extra salt to your diet unless your doctor tells you to. °Eat small meals often. °Avoid standing up quickly after you eat. °Medicines °Take over-the-counter and prescription medicines only as told by your doctor. °Follow instructions from your doctor about changing how much you take of your medicines, if this applies. °Do not stop or change any of your medicines on your own. °General instructions ° °Wear compression stockings as told by your doctor. °Get up slowly from lying down or sitting. °Avoid hot showers and a lot of heat as told by your doctor. °Return to your normal activities when your doctor says that it is safe. °Do not smoke or use any products that contain nicotine or tobacco. If you need help quitting, ask your doctor. °Keep all follow-up visits. °Contact a doctor if: °You vomit. °You have watery poop (diarrhea). °You have a fever for more than 2-3 days. °You feel more thirsty than normal. °You feel weak and tired. °Get help right away if: °You have chest pain. °You have a fast or uneven heartbeat. °You lose feeling (have numbness) in any part of your body. °You cannot move your arms or your legs. °You have trouble talking. °You get sweaty or feel light-headed. °You faint. °You have trouble breathing. °You have trouble staying awake. °You feel mixed up (confused). °These symptoms may be an emergency. Get help right away. Call 911. °Do not wait to see if the symptoms will go away. °Do not drive yourself to the hospital. °Summary °Hypotension is also called low blood pressure. It is when the force of blood pumping through your body   is too weak. °Hypotension may be harmless, or it may cause serious problems. °Treatment may include changing your diet and medicines, and wearing compression stockings. °In very bad cases, you may need to go to the hospital. °This information is not intended to replace advice given to you by your health care  provider. Make sure you discuss any questions you have with your health care provider. °Document Revised: 07/30/2021 Document Reviewed: 07/30/2021 °Elsevier Patient Education © 2022 [employer] Inc. ° °

## 2022-02-28 NOTE — Assessment & Plan Note (Signed)
C-Met and lipid profile today ?Encouraged her to consume a low-fat diet ?

## 2022-02-28 NOTE — Assessment & Plan Note (Signed)
Discussed starting Fludrocortisone versus Midodrine however she would like to research these medications and she will get back with me ?Encourage adequate water intake and eating routine meals ?We will monitor ?

## 2022-02-28 NOTE — Assessment & Plan Note (Signed)
TSH and free T4 today We will adjust Levothyroxine if needed based on labs 

## 2022-02-28 NOTE — Assessment & Plan Note (Signed)
Not currently on antiseizure medication following with neurology ?We will monitor ?

## 2022-02-28 NOTE — Progress Notes (Signed)
? ?Subjective:  ? ? Patient ID: Christine Herring, female    DOB: [birth year], 36 y.o.   MRN: [id number] ? ?HPI ? ?Patient presents to clinic today for follow-up of chronic conditions. ? ?Hypotension: Her BP today is 85/49. She is not currently taking any medications for this. She denies recent syncopal episode. ? ?Hypothyroidism: She denies any issues on her current dose of her Levothyroxine. She does not follow with endocrinology. ? ?Iron Deficiency Anemia: her last H/H was 12.7/40.3, 07/2020. She is taking Iron as prescribed. She does not follow with hematology. ? ?HLD: Her last LDL was 112, triglycerides 97, 05/2021. She is not taking any cholesterol lowering medications at this time. ? 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  She has subsequent numbness in bilateral lower extremities.  She takes Ibuprofen or Tylenol OTC with minimal relief of symptoms.  ? ?Anxiety: Intermittent. She is not taking anything for this. She is not currently seeing a therapist. She denies depression, SI/HI. ? 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 She is not taking any anti-seizure meds. She is not seeing neurologist. ? ? ?Review of Systems ? ?   ?Past Medical History:  ?Diagnosis Date  ? Anxiety   ? Hip dysplasia   ? Osteoporosis   ? Seizures (HCC)   ? when she was a child reason not know  ? Thyroid disease   ? ? ?Current Outpatient Medications  ?Medication Sig Dispense Refill  ? albuterol (PROVENTIL HFA;VENTOLIN HFA) 108 (90 Base) MCG/ACT inhaler Inhale 2 puffs into the lungs every 6 (six) hours as needed for wheezing or shortness of breath. (Patient not taking: No sig reported) 1 Inhaler 2  ? Calcium Carb-Cholecalciferol 500-200 MG-UNIT TABS Take 2 tablets by mouth daily. 60 tablet 11  ? Emollient (COLLAGEN EX) Apply topically.    ? Ferrous Sulfate (IRON) 325 (65 Fe) MG TABS Take by mouth.    ? levothyroxine (SYNTHROID) 88 MCG tablet Take 1 tablet (88 mcg total) by mouth daily before breakfast. 90 tablet 0  ?  loratadine (CLARITIN) 10 MG tablet Take 10 mg by mouth daily.    ? Multiple Vitamin (MULTIVITAMIN) tablet Take 1 tablet by mouth daily.    ? ?No current facility-administered medications for this visit.  ? ? ?Allergies  ?Allergen Reactions  ? Peanut Oil Hives  ? ? ?Family History  ?Problem Relation Age of Onset  ? Thyroid disease Mother   ? Breast cancer Maternal Aunt   ? Colon cancer Maternal Aunt   ?     grt   ? Ovarian cancer Paternal Grandmother   ? Diabetes Neg Hx   ? Heart disease Neg Hx   ? ? ?Social History  ? ?Socioeconomic History  ? Marital status: Single  ?  Spouse name: Not on file  ? Number of children: Not on file  ? Years of education: Not on file  ? Highest education level: Not on file  ?Occupational History  ? Not on file  ?Tobacco Use  ? Smoking status: Former  ?  Packs/day: 0.50  ?  Years: 10.00  ?  Pack years: 5.00  ?  Types: Cigarettes  ?  Quit date: 07/16/2005  ?  Years since quitting: 16.6  ? Smokeless tobacco: Never  ?Vaping Use  ? Vaping Use: Never used  ?Substance and Sexual Activity  ? Alcohol use: Yes  ?  Comment: social  ? Drug use: No  ? Sexual activity: Yes  ?  Birth control/protection:  Surgical  ?Other Topics Concern  ? Not on file  ?Social History Narrative  ? Not on file  ? ?Social Determinants of Health  ? ?Financial Resource Strain: Not on file  ?Food Insecurity: Not on file  ?Transportation Needs: Not on file  ?Physical Activity: Not on file  ?Stress: Not on file  ?Social Connections: Not on file  ?Intimate Partner Violence: Not on file  ? ? ? ?Constitutional: Denies fever, malaise, fatigue, headache or abrupt weight changes.  ?HEENT: Denies eye pain, eye redness, ear pain, ringing in the ears, wax buildup, runny nose, nasal congestion, bloody nose, or sore throat. ?Respiratory: Denies difficulty breathing, shortness of breath, cough or sputum production.   ?Cardiovascular: Denies chest pain, chest tightness, palpitations or swelling in the hands or feet.  ?Gastrointestinal:  Denies abdominal pain, bloating, constipation, diarrhea or blood in the stool.  ?GU: Denies urgency, frequency, pain with urination, burning sensation, blood in urine, odor or discharge. ?Musculoskeletal: Patient reports intermittent hip pain.  Denies decrease in range of motion, difficulty with gait, muscle pain or joint swelling.  ?Skin: Patient reports enlarging keloid to right upper extremity.  Denies redness, rashes, or ulcercations.  ?Neurological: Patient reports intermittent lightheadedness, numbness of BLE.  Denies dizziness, difficulty with memory, difficulty with speech or problems with balance and coordination.  ?Psych: Patient has a history of anxiety.  Denies depression, SI/HI. ? ?No other specific complaints in a complete review of systems (except as listed in HPI above). ? ?Objective:  ? Physical Exam ? ?BP (!) 85/49 (BP Location: Left Arm, Patient Position: Sitting, Cuff Size: Large)   Pulse 61   Temp (!) 97.5 ?F (36.4 ?C) (Temporal)   Wt 172 lb (78 kg)   SpO2 100%   BMI 29.52 kg/m?  ? ?Wt Readings from Last 3 Encounters:  ?03/20/21 175 lb 12.8 oz (79.7 kg)  ?09/07/20 177 lb 12.8 oz (80.6 kg)  ?08/18/20 174 lb (78.9 kg)  ? ? ?General: Appears her stated age, overweight, in NAD. ?Skin: Warm, dry and intact.  Healing reported of right upper extremity. ?HEENT: Head: normal shape and size; Eyes: sclera white and EOMs intact;  ?Neck:  Neck supple, trachea midline. No masses, lumps or thyromegaly present.  ?Cardiovascular: Normal rate and rhythm. S1,S2 noted.  No murmur, rubs or gallops noted. No JVD or BLE edema. ?Pulmonary/Chest: Normal effort and positive vesicular breath sounds. No respiratory distress. No wheezes, rales or ronchi noted.  ?Musculoskeletal: No difficulty with gait.  ?Neurological: Alert and oriented.  ?Psychiatric: Mood and affect normal. Behavior is normal. Judgment and thought content normal.  ? ? ?BMET ?   ?Component Value Date/Time  ? NA 139 06/19/2021 0914  ? K 4.2 06/19/2021  0914  ? CL 106 06/19/2021 0914  ? CO2 27 06/19/2021 0914  ? GLUCOSE 90 06/19/2021 0914  ? BUN 18 06/19/2021 0914  ? CREATININE 0.69 06/19/2021 0914  ? CALCIUM 8.7 06/19/2021 0914  ? GFRNONAA 114 06/19/2021 0914  ? GFRAA 133 06/19/2021 0914  ? ? ?Lipid Panel  ?   ?Component Value Date/Time  ? CHOL 174 06/19/2021 0914  ? TRIG 97 06/19/2021 0914  ? HDL 42 (L) 06/19/2021 0914  ? CHOLHDL 4.1 06/19/2021 0914  ? LDLCALC 112 (H) 06/19/2021 0914  ? ? ?CBC ?   ?Component Value Date/Time  ? WBC 7.4 08/08/2020 0843  ? RBC 4.81 08/08/2020 0843  ? HGB 12.7 08/08/2020 0843  ? HGB 11.5 10/15/2016 1309  ? HCT 40.3 08/08/2020 0843  ?  HCT 37.5 10/15/2016 1309  ? PLT 253 08/08/2020 0843  ? PLT 263 10/15/2016 1309  ? MCV 83.8 08/08/2020 0843  ? MCV 75 (L) 10/15/2016 1309  ? MCH 26.4 (L) 08/08/2020 0843  ? MCHC 31.5 (L) 08/08/2020 0843  ? RDW 13.9 08/08/2020 0843  ? RDW 16.8 (H) 10/15/2016 1309  ? LYMPHSABS 2,050 08/08/2020 0843  ? LYMPHSABS 2.6 10/15/2016 1309  ? MONOABS 385 09/17/2016 1415  ? EOSABS 207 08/08/2020 0843  ? EOSABS 0.2 10/15/2016 1309  ? BASOSABS 37 08/08/2020 0843  ? BASOSABS 0.0 10/15/2016 1309  ? ? ?Hgb A1C ?Lab Results  ?Component Value Date  ? HGBA1C 4.9 09/09/2017  ? ? ? ? ? ? ? ?   ?Assessment & Plan:  ? ? ?Nicki Reaper, NP ?This visit occurred during the SARS-CoV-2 public health emergency.  Safety protocols were in place, including screening questions prior to the visit, additional usage of staff PPE, and extensive cleaning of exam room while observing appropriate contact time as indicated for disinfecting solutions.  ? ?

## 2022-03-01 LAB — CBC
HCT: 42 % (ref 35.0–45.0)
Hemoglobin: 13.3 g/dL (ref 11.7–15.5)
MCH: 27 pg (ref 27.0–33.0)
MCHC: 31.7 g/dL — ABNORMAL LOW (ref 32.0–36.0)
MCV: 85.2 fL (ref 80.0–100.0)
MPV: 10.5 fL (ref 7.5–12.5)
Platelets: 272 10*3/uL (ref 140–400)
RBC: 4.93 10*6/uL (ref 3.80–5.10)
RDW: 13.3 % (ref 11.0–15.0)
WBC: 6.6 10*3/uL (ref 3.8–10.8)

## 2022-03-01 LAB — IRON,TIBC AND FERRITIN PANEL
%SAT: 12 % (calc) — ABNORMAL LOW (ref 16–45)
Ferritin: 10 ng/mL — ABNORMAL LOW (ref 16–154)
Iron: 51 ug/dL (ref 40–190)
TIBC: 415 mcg/dL (calc) (ref 250–450)

## 2022-03-01 LAB — COMPLETE METABOLIC PANEL WITH GFR
AG Ratio: 1.3 (calc) (ref 1.0–2.5)
ALT: 18 U/L (ref 6–29)
AST: 17 U/L (ref 10–30)
Albumin: 4.1 g/dL (ref 3.6–5.1)
Alkaline phosphatase (APISO): 73 U/L (ref 31–125)
BUN: 15 mg/dL (ref 7–25)
CO2: 28 mmol/L (ref 20–32)
Calcium: 9.3 mg/dL (ref 8.6–10.2)
Chloride: 105 mmol/L (ref 98–110)
Creat: 0.76 mg/dL (ref 0.50–0.97)
Globulin: 3.2 g/dL (calc) (ref 1.9–3.7)
Glucose, Bld: 95 mg/dL (ref 65–139)
Potassium: 4.2 mmol/L (ref 3.5–5.3)
Sodium: 140 mmol/L (ref 135–146)
Total Bilirubin: 0.4 mg/dL (ref 0.2–1.2)
Total Protein: 7.3 g/dL (ref 6.1–8.1)
eGFR: 105 mL/min/{1.73_m2} (ref >=60)

## 2022-03-01 LAB — LIPID PANEL
Cholesterol: 184 mg/dL (ref <200)
HDL: 45 mg/dL — ABNORMAL LOW (ref >=50)
LDL Cholesterol (Calc): 120 mg/dL (calc) — ABNORMAL HIGH
Non-HDL Cholesterol (Calc): 139 mg/dL (calc) — ABNORMAL HIGH (ref <130)
Total CHOL/HDL Ratio: 4.1 (calc) (ref <5.0)
Triglycerides: 90 mg/dL (ref <150)

## 2022-03-01 LAB — HEMOGLOBIN A1C
Hgb A1c MFr Bld: 5.5 % of total Hgb (ref <5.7)
Mean Plasma Glucose: 111 mg/dL
eAG (mmol/L): 6.2 mmol/L

## 2022-03-01 LAB — TSH: TSH: 2.35 mIU/L

## 2022-03-01 LAB — T4, FREE: Free T4: 1.1 ng/dL (ref 0.8–1.8)

## 2022-03-28 ENCOUNTER — Other Ambulatory Visit: Payer: Self-pay | Admitting: Internal Medicine

## 2022-03-28 DIAGNOSIS — I9589 Other hypotension: Secondary | ICD-10-CM

## 2022-03-28 NOTE — Telephone Encounter (Signed)
Requested Prescriptions  ?Pending Prescriptions Disp Refills  ?? levothyroxine (SYNTHROID) 88 MCG tablet [Pharmacy Med Name: LEVOTHYROXINE 88 MCG TABLET] 90 tablet 1  ?  Sig: TAKE 1 TABLET BY MOUTH DAILY BEFORE BREAKFAST.  ?  ? Endocrinology:  Hypothyroid Agents Passed - 03/28/2022  2:07 AM  ?  ?  Passed - TSH in normal range and within 360 days  ?  TSH  ?Date Value Ref Range Status  ?02/28/2022 2.35 mIU/L Final  ?  Comment:  ?            Reference Range ?. ?          > or = 20 Years  0.40-4.50 ?. ?               Pregnancy Ranges ?          First trimester    0.26-2.66 ?          Second trimester   0.55-2.73 ?          Third trimester    0.43-2.91 ?  ?   ?  ?  Passed - Valid encounter within last 12 months  ?  Recent Outpatient Visits   ?      ? 4 weeks ago Chronic hypotension  ? [facility] [place], [place], NP  ? 1 year ago Hypothyroidism due to acquired atrophy of thyroid  ? [facility] Althea Charon, Netta Neat, DO  ? 1 year ago Well woman exam with routine gynecological exam  ? [facility], Jodelle Gross, FNP  ? 1 year ago Abnormal menses  ? [facility], Jodelle Gross, FNP  ? 1 year ago Hypothyroidism due to acquired atrophy of thyroid  ? [facility], Jodelle Gross, FNP  ?  ?  ? ?  ?  ?  ? ? ?

## 2022-06-12 ENCOUNTER — Ambulatory Visit: Payer: Commercial Managed Care - PPO | Admitting: Internal Medicine

## 2022-08-21 ENCOUNTER — Encounter: Payer: Self-pay | Admitting: Internal Medicine

## 2022-08-21 ENCOUNTER — Ambulatory Visit: Payer: Commercial Managed Care - PPO | Admitting: Internal Medicine

## 2022-08-21 VITALS — BP 104/58 | HR 58 | Temp 97.3°F | Wt 182.0 lb

## 2022-08-21 DIAGNOSIS — Z23 Encounter for immunization: Secondary | ICD-10-CM | POA: Diagnosis not present

## 2022-08-21 DIAGNOSIS — E6609 Other obesity due to excess calories: Secondary | ICD-10-CM

## 2022-08-21 DIAGNOSIS — Z0001 Encounter for general adult medical examination with abnormal findings: Secondary | ICD-10-CM

## 2022-08-21 DIAGNOSIS — E034 Atrophy of thyroid (acquired): Secondary | ICD-10-CM | POA: Diagnosis not present

## 2022-08-21 DIAGNOSIS — Z114 Encounter for screening for human immunodeficiency virus [HIV]: Secondary | ICD-10-CM

## 2022-08-21 DIAGNOSIS — G4719 Other hypersomnia: Secondary | ICD-10-CM

## 2022-08-21 DIAGNOSIS — Z6831 Body mass index (BMI) 31.0-31.9, adult: Secondary | ICD-10-CM

## 2022-08-21 DIAGNOSIS — Z87898 Personal history of other specified conditions: Secondary | ICD-10-CM

## 2022-08-21 DIAGNOSIS — Z1159 Encounter for screening for other viral diseases: Secondary | ICD-10-CM

## 2022-08-21 DIAGNOSIS — E538 Deficiency of other specified B group vitamins: Secondary | ICD-10-CM

## 2022-08-21 DIAGNOSIS — R0683 Snoring: Secondary | ICD-10-CM

## 2022-08-21 DIAGNOSIS — E559 Vitamin D deficiency, unspecified: Secondary | ICD-10-CM

## 2022-08-21 NOTE — Assessment & Plan Note (Signed)
Encouraged diet and exercise for weight loss ?

## 2022-08-21 NOTE — Progress Notes (Signed)
Subjective:    Patient ID: Christine Herring, female    DOB: 31-May-1986, 36 y.o.   MRN: [id number]  HPI  Patient presents to clinic today for her annual exam.  She also reports itchiness of her left breast.  She noticed this a few months ago. She reports associated fullness and milky discharge. She denies lumps or masses. She has not noticed a rash.   Flu: 11/2021 Tetanus: 05/2012 COVID: [place] x2 Pap Smear: 08/2020 Dentist: biannually  Diet: She does eat meat. She consumes fruits and veggies. She does eat some fried foods. She drinks mostly water, coffee. Exercise: body weight exercises, strength training  Review of Systems     Past Medical History:  Diagnosis Date   Anxiety    Hip dysplasia    Osteoporosis    Seizures ([place])    when she was a child reason not know   Thyroid disease     Current Outpatient Medications  Medication Sig Dispense Refill   albuterol (PROVENTIL HFA;VENTOLIN HFA) 108 (90 Base) MCG/ACT inhaler Inhale 2 puffs into the lungs every 6 (six) hours as needed for wheezing or shortness of breath. 1 Inhaler 2   Calcium Carb-Cholecalciferol 500-200 MG-UNIT TABS Take 2 tablets by mouth daily. 60 tablet 11   Emollient (COLLAGEN EX) Apply topically.     Ferrous Sulfate (IRON) 325 (65 Fe) MG TABS Take by mouth.     levothyroxine (SYNTHROID) 88 MCG tablet TAKE 1 TABLET BY MOUTH DAILY BEFORE BREAKFAST. 90 tablet 1   loratadine (CLARITIN) 10 MG tablet Take 10 mg by mouth daily.     Multiple Vitamin (MULTIVITAMIN) tablet Take 1 tablet by mouth daily.     No current facility-administered medications for this visit.    Allergies  Allergen Reactions   Peanut Oil Hives    Family History  Problem Relation Age of Onset   Thyroid disease Mother    Breast cancer Maternal Aunt    Colon cancer Maternal Aunt        grt    Ovarian cancer Paternal Grandmother    Diabetes Neg Hx    Heart disease Neg Hx     Social History   Socioeconomic History   Marital status:  Single    Spouse name: Not on file   Number of children: Not on file   Years of education: Not on file   Highest education level: Not on file  Occupational History   Not on file  Tobacco Use   Smoking status: Former    Packs/day: 0.50    Years: 10.00    Total pack years: 5.00    Types: Cigarettes    Quit date: 07/16/2005    Years since quitting: 17.1   Smokeless tobacco: Never  Vaping Use   Vaping Use: Never used  Substance and Sexual Activity   Alcohol use: Yes    Comment: social   Drug use: No   Sexual activity: Yes    Birth control/protection: Surgical  Other Topics Concern   Not on file  Social History Narrative   Not on file   Social Determinants of Health   Financial Resource Strain: Not on file  Food Insecurity: Not on file  Transportation Needs: Not on file  Physical Activity: Not on file  Stress: Not on file  Social Connections: Not on file  Intimate Partner Violence: Not on file     Constitutional: Pt reports difficulty losing weight. Denies fever, malaise, fatigue, headache or abrupt weight changes.  HEENT: Pt  reports sore throat. Denies eye pain, eye redness, ear pain, ringing in the ears, wax buildup, runny nose, nasal congestion, bloody nose. Respiratory: Denies difficulty breathing, shortness of breath, cough or sputum production.   Cardiovascular: Denies chest pain, chest tightness, palpitations or swelling in the hands or feet.  Gastrointestinal: Denies abdominal pain, bloating, constipation, diarrhea or blood in the stool.  GU: Denies urgency, frequency, pain with urination, burning sensation, blood in urine, odor or discharge. Musculoskeletal: Patient reports intermittent joint pain.  Denies decrease in range of motion, difficulty with gait, muscle pain or joint swelling.  Skin: Patient reports itchiness of left breast.  Denies redness, rashes, lesions or ulcercations.  Neurological: Denies dizziness, difficulty with memory, difficulty with speech or  problems with balance and coordination.  Psych: Patient has a history of anxiety.  Denies depression, SI/HI.  No other specific complaints in a complete review of systems (except as listed in HPI above).  Objective:   Physical Exam BP (!) 104/58 (BP Location: Right Arm, Patient Position: Sitting, Cuff Size: Normal)   Pulse (!) 58   Temp (!) 97.3 F (36.3 C) (Temporal)   Wt 182 lb (82.6 kg)   SpO2 99%   BMI 31.24 kg/m    General: Appears here stated age, obese, in NAD. Skin: Warm, dry and intact. No rashes, lesions or ulcerations noted. Breast: No change in the skin. Fibrocystic changes noted. No axillaary lymphadenopathy noted. HEENT: Head: normal shape and size; Eyes: sclera white, no icterus, conjunctiva pink, PERRLA and EOMs intact; Ears: Tm's gray and intact, normal light reflex; Throat/Mouth: Teeth present, mucosa pink and moist, no exudate, lesions or ulcerations noted.  Neck:  Neck supple, trachea midline. No masses, lumps or thyromegaly present.  Cardiovascular: Bradycardic with normal rhythm. S1,S2 noted.  No murmur, rubs or gallops noted. No JVD or BLE edema.  Pulmonary/Chest: Normal effort and positive vesicular breath sounds. No respiratory distress. No wheezes, rales or ronchi noted.  Abdomen: Normal bowel sounds.  Musculoskeletal: Strength 5/5 BUE/BLE. No difficulty with gait.  Neurological: Alert and oriented. Cranial nerves II-XII grossly intact. Coordination normal.  Psychiatric: Mood and affect normal. Behavior is normal. Judgment and thought content normal.   BMET    Component Value Date/Time   NA 140 02/28/2022 0847   K 4.2 02/28/2022 0847   CL 105 02/28/2022 0847   CO2 28 02/28/2022 0847   GLUCOSE 95 02/28/2022 0847   BUN 15 02/28/2022 0847   CREATININE 0.76 02/28/2022 0847   CALCIUM 9.3 02/28/2022 0847   GFRNONAA 114 06/19/2021 0914   GFRAA 133 06/19/2021 0914    Lipid Panel     Component Value Date/Time   CHOL 184 02/28/2022 0847   TRIG 90  02/28/2022 0847   HDL 45 (L) 02/28/2022 0847   CHOLHDL 4.1 02/28/2022 0847   LDLCALC 120 (H) 02/28/2022 0847    CBC    Component Value Date/Time   WBC 6.6 02/28/2022 0847   RBC 4.93 02/28/2022 0847   HGB 13.3 02/28/2022 0847   HGB 11.5 10/15/2016 1309   HCT 42.0 02/28/2022 0847   HCT 37.5 10/15/2016 1309   PLT 272 02/28/2022 0847   PLT 263 10/15/2016 1309   MCV 85.2 02/28/2022 0847   MCV 75 (L) 10/15/2016 1309   MCH 27.0 02/28/2022 0847   MCHC 31.7 (L) 02/28/2022 0847   RDW 13.3 02/28/2022 0847   RDW 16.8 (H) 10/15/2016 1309   LYMPHSABS 2,050 08/08/2020 0843   LYMPHSABS 2.6 10/15/2016 1309   MONOABS 385  09/17/2016 1415   EOSABS 207 08/08/2020 0843   EOSABS 0.2 10/15/2016 1309   BASOSABS 37 08/08/2020 0843   BASOSABS 0.0 10/15/2016 1309    Hgb A1C Lab Results  Component Value Date   HGBA1C 5.5 02/28/2022            Assessment & Plan:   Preventative Health Maintenance:  She declines flu shot Tdap today Encouraged her to get her COVID booster Pap smear UTD Encouraged her to consume a balanced diet and exercise regimen Advised her to see an eye doctor and dentist annually We will check CBC, c-Met, lipid, TSH, free T4, A1c, HIV and hep C today  Left Breast Problem:  [place] left Breast ordered  RTC in 6 months, follow-up chronic conditions Webb Silversmith, NP

## 2022-08-21 NOTE — Patient Instructions (Signed)

## 2022-08-22 ENCOUNTER — Other Ambulatory Visit: Payer: Self-pay | Admitting: Internal Medicine

## 2022-08-22 ENCOUNTER — Encounter: Payer: Self-pay | Admitting: Internal Medicine

## 2022-08-22 DIAGNOSIS — Z87898 Personal history of other specified conditions: Secondary | ICD-10-CM

## 2022-08-22 DIAGNOSIS — E034 Atrophy of thyroid (acquired): Secondary | ICD-10-CM

## 2022-08-22 LAB — COMPLETE METABOLIC PANEL WITH GFR
AG Ratio: 1.4 (calc) (ref 1.0–2.5)
ALT: 13 U/L (ref 6–29)
AST: 13 U/L (ref 10–30)
Albumin: 4.1 g/dL (ref 3.6–5.1)
Alkaline phosphatase (APISO): 92 U/L (ref 31–125)
BUN: 16 mg/dL (ref 7–25)
CO2: 25 mmol/L (ref 20–32)
Calcium: 8.8 mg/dL (ref 8.6–10.2)
Chloride: 105 mmol/L (ref 98–110)
Creat: 0.8 mg/dL (ref 0.50–0.97)
Globulin: 3 g/dL (calc) (ref 1.9–3.7)
Glucose, Bld: 89 mg/dL (ref 65–99)
Potassium: 3.9 mmol/L (ref 3.5–5.3)
Sodium: 140 mmol/L (ref 135–146)
Total Bilirubin: 0.3 mg/dL (ref 0.2–1.2)
Total Protein: 7.1 g/dL (ref 6.1–8.1)
eGFR: 99 mL/min/{1.73_m2} (ref >=60)

## 2022-08-22 LAB — CBC
HCT: 39.9 % (ref 35.0–45.0)
Hemoglobin: 13.1 g/dL (ref 11.7–15.5)
MCH: 27.2 pg (ref 27.0–33.0)
MCHC: 32.8 g/dL (ref 32.0–36.0)
MCV: 82.8 fL (ref 80.0–100.0)
MPV: 10.2 fL (ref 7.5–12.5)
Platelets: 304 10*3/uL (ref 140–400)
RBC: 4.82 10*6/uL (ref 3.80–5.10)
RDW: 13.5 % (ref 11.0–15.0)
WBC: 5.9 10*3/uL (ref 3.8–10.8)

## 2022-08-22 LAB — LIPID PANEL
Cholesterol: 208 mg/dL — ABNORMAL HIGH (ref <200)
HDL: 44 mg/dL — ABNORMAL LOW (ref >=50)
LDL Cholesterol (Calc): 122 mg/dL (calc) — ABNORMAL HIGH
Non-HDL Cholesterol (Calc): 164 mg/dL (calc) — ABNORMAL HIGH (ref <130)
Total CHOL/HDL Ratio: 4.7 (calc) (ref <5.0)
Triglycerides: 274 mg/dL — ABNORMAL HIGH (ref <150)

## 2022-08-22 LAB — HEMOGLOBIN A1C
Hgb A1c MFr Bld: 5.3 % of total Hgb (ref <5.7)
Mean Plasma Glucose: 105 mg/dL
eAG (mmol/L): 5.8 mmol/L

## 2022-08-22 LAB — VITAMIN B12: Vitamin B-12: 326 pg/mL (ref 200–1100)

## 2022-08-22 LAB — HEPATITIS C ANTIBODY: Hepatitis C Ab: NONREACTIVE

## 2022-08-22 LAB — TSH: TSH: 4.92 mIU/L — ABNORMAL HIGH

## 2022-08-22 LAB — T4, FREE: Free T4: 1.2 ng/dL (ref 0.8–1.8)

## 2022-08-22 LAB — VITAMIN D 25 HYDROXY (VIT D DEFICIENCY, FRACTURES): Vit D, 25-Hydroxy: 19 ng/mL — ABNORMAL LOW (ref 30–100)

## 2022-08-22 LAB — HIV ANTIBODY (ROUTINE TESTING W REFLEX): HIV 1&2 Ab, 4th Generation: NONREACTIVE

## 2022-08-22 MED ORDER — VITAMIN D (ERGOCALCIFEROL) 1.25 MG (50000 UNIT) PO CAPS: 50000.0000 [IU] | ORAL_CAPSULE | ORAL | 0 refills | Status: AC

## 2022-08-22 MED ORDER — LEVOTHYROXINE SODIUM 100 MCG PO TABS: 100.0000 ug | ORAL_TABLET | Freq: Every day | ORAL | 1 refills | Status: DC

## 2022-09-11 ENCOUNTER — Ambulatory Visit
Admission: RE | Admit: 2022-09-11 | Discharge: 2022-09-11 | Disposition: A | Discharge status: Home / Self Care | Payer: Commercial Managed Care - PPO | Source: Ambulatory Visit | Attending: Internal Medicine | Admitting: Internal Medicine

## 2022-09-11 DIAGNOSIS — Z87898 Personal history of other specified conditions: Secondary | ICD-10-CM

## 2022-09-11 DIAGNOSIS — Z1231 Encounter for screening mammogram for malignant neoplasm of breast: Secondary | ICD-10-CM | POA: Insufficient documentation

## 2022-09-12 ENCOUNTER — Ambulatory Visit: Payer: Commercial Managed Care - PPO | Admitting: Internal Medicine

## 2022-09-12 ENCOUNTER — Encounter: Payer: Self-pay | Admitting: Internal Medicine

## 2022-09-12 VITALS — BP 116/78 | HR 73 | Temp 97.9°F | Resp 16 | Ht 64.0 in | Wt 176.5 lb

## 2022-09-12 DIAGNOSIS — D509 Iron deficiency anemia, unspecified: Secondary | ICD-10-CM | POA: Diagnosis not present

## 2022-09-12 DIAGNOSIS — R232 Flushing: Secondary | ICD-10-CM | POA: Diagnosis not present

## 2022-09-12 DIAGNOSIS — N6452 Nipple discharge: Secondary | ICD-10-CM

## 2022-09-12 DIAGNOSIS — I9589 Other hypotension: Secondary | ICD-10-CM

## 2022-09-12 DIAGNOSIS — N926 Irregular menstruation, unspecified: Secondary | ICD-10-CM

## 2022-09-12 DIAGNOSIS — M7989 Other specified soft tissue disorders: Secondary | ICD-10-CM

## 2022-09-12 DIAGNOSIS — E039 Hypothyroidism, unspecified: Secondary | ICD-10-CM

## 2022-09-12 NOTE — Progress Notes (Signed)
New Patient Office Visit  Subjective    Patient ID: Christine Herring, female    DOB: [birth year]  Age: 36 y.o. MRN: [id number]  CC:  Chief Complaint  Patient presents with   Establish Care   Headache    Her endocrine dr. Dion Saucier out thyroid and referred her to neurology has appt next week.  Has ordered for labs for hormones to be done Headaches, hives,itching in breast- had mammogram normal, hot flashes, sensitivity to light and retaining fluid.    HPI Christine Herring presents to establish care.  Patient is having a myriad of vague symptoms that has started over the last several months.  She states she had an incident while she was at the beach when she broke out in hot flashes and hives all over her body.  At the time she felt like she was "burning from the inside".  She took Benadryl and used aloe, which controlled her symptoms.  Since then she has been having an intense pinching sensation of her left breast.  She recently had a mammogram and ultrasound of the left breast, both of which were normal.  She also states she has been having a milky like discharge from her left nipple.  Since the ultrasound, this has resolved but she is still having clear discharge from the nipple.  She does have a history of irregular and heavy periods.  She does have a history of tubal ligation.  She is also having extreme fatigue and sleepiness, as well as headaches almost on a daily basis.  She states light seems to trigger the headaches.  The pain is in between her eyes.  She is also having swelling "all over", mostly in her upper extremities, hands and fingers.  She states she was so swollen she could not put her rings on.  She also noticed her feet were swollen to the point where she could not fit into her normal size shoes.  For this she did take an over-the-counter diuretic but states this did not help.  She does have a history of having low drops in blood pressure, stating her blood pressure has dropped as low as  80/60.  Hypothyroidism: -Medications: Levothyroxine 100 mcg (had been on 88 mcg when her last set of labs were obtained) -Patient is compliant with the above medication (s) at the above dose -Last TSH: 4.92 08/21/22 -Following with Endocrinology, last seen on 09/02/22.  Asthma:  -Asthma status: stable -Current Treatments: Albuterol PRN -Satisfied with current treatment?: yes  Seasonal Allergies: -Currently on Claritin as needed  Congenital Hip Dysplasia: -Following with [facility] -Underwent bilateral hip replacements  History of Seizures: -In childhood  -Had been on Phenobarbital - had an unspecified reaction and is no longer on this medication.  She is currently not on any medications to prevent seizure-like activity. -States when she passes out is when she has seizures, this has not happened since 2013 when she was overheated, lost consciousness and had seizure-like activity at that time -She does not have a neurologist who she follows with regularly.  Outpatient Encounter Medications as of 09/12/2022  Medication Sig   albuterol (PROVENTIL HFA;VENTOLIN HFA) 108 (90 Base) MCG/ACT inhaler Inhale 2 puffs into the lungs every 6 (six) hours as needed for wheezing or shortness of breath.   Emollient (COLLAGEN EX) Apply topically.   Ferrous Sulfate (IRON) 325 (65 Fe) MG TABS Take by mouth.   levothyroxine (SYNTHROID) 100 MCG tablet Take 1 tablet (100 mcg total) by mouth daily.  loratadine (CLARITIN) 10 MG tablet Take 10 mg by mouth daily.   Multiple Vitamin (MULTIVITAMIN) tablet Take 1 tablet by mouth daily.   Vitamin D, Ergocalciferol, (DRISDOL) 1.25 MG (50000 UNIT) CAPS capsule Take 1 capsule (50,000 Units total) by mouth once a week. For 12 weeks. Then start OTC Vitamin D3 2,000 unit daily.   [DISCONTINUED] Calcium Carb-Cholecalciferol 500-200 MG-UNIT TABS Take 2 tablets by mouth daily.   No facility-administered encounter medications on file as of 09/12/2022.    Past  Medical History:  Diagnosis Date   Anxiety    Hip dysplasia    Osteoporosis    Seizures ([place])    when she was a child reason not know   Thyroid disease     Past Surgical History:  Procedure Laterality Date   OVARIAN CYST REMOVAL     TOTAL HIP ARTHROPLASTY Bilateral    TUBAL LIGATION      Family History  Problem Relation Age of Onset   Thyroid disease Mother    Hyperlipidemia Father    Thyroid disease Father    Breast cancer Maternal Aunt    Colon cancer Maternal Aunt        grt    Ovarian cancer Paternal Grandmother    Diabetes Neg Hx    Heart disease Neg Hx     Social History   Socioeconomic History   Marital status: Single    Spouse name: Not on file   Number of children: Not on file   Years of education: Not on file   Highest education level: Not on file  Occupational History   Not on file  Tobacco Use   Smoking status: Former    Packs/day: 0.50    Years: 10.00    Total pack years: 5.00    Types: Cigarettes    Quit date: 07/16/2005    Years since quitting: 17.1   Smokeless tobacco: Never  Vaping Use   Vaping Use: Never used  Substance and Sexual Activity   Alcohol use: Yes    Comment: social   Drug use: No   Sexual activity: Yes    Birth control/protection: Surgical  Other Topics Concern   Not on file  Social History Narrative   Not on file   Social Determinants of Health   Financial Resource Strain: Not on file  Food Insecurity: Not on file  Transportation Needs: Not on file  Physical Activity: Not on file  Stress: Not on file  Social Connections: Not on file  Intimate Partner Violence: Not on file    Review of Systems  Constitutional:  Positive for malaise/fatigue. Negative for chills and fever.  Eyes:  Positive for blurred vision.  Respiratory:  Negative for shortness of breath.   Cardiovascular:  Negative for chest pain and palpitations.  Gastrointestinal:  Negative for abdominal pain, constipation, diarrhea, nausea and vomiting.   Genitourinary:  Negative for dysuria and hematuria.  Musculoskeletal:  Negative for joint pain and myalgias.  Skin:  Positive for itching and rash.  Neurological:  Positive for headaches.        Objective    BP 116/78   Pulse 73   Temp 97.9 F (36.6 C)   Resp 16   Ht 5' 4"  (1.626 m)   Wt 176 lb 8 oz (80.1 kg)   LMP 09/12/2022   SpO2 99%   BMI 30.30 kg/m   Physical Exam Constitutional:      Appearance: Normal appearance.  HENT:     Head: Normocephalic and  atraumatic.     Mouth/Throat:     Mouth: Mucous membranes are moist.     Pharynx: Oropharynx is clear.  Eyes:     Extraocular Movements: Extraocular movements intact.     Conjunctiva/sclera: Conjunctivae normal.     Pupils: Pupils are equal, round, and reactive to light.  Cardiovascular:     Rate and Rhythm: Normal rate and regular rhythm.  Pulmonary:     Effort: Pulmonary effort is normal.     Breath sounds: Normal breath sounds.  Musculoskeletal:     Right lower leg: No edema.     Left lower leg: No edema.  Lymphadenopathy:     Cervical: No cervical adenopathy.  Skin:    General: Skin is warm and dry.  Neurological:     General: No focal deficit present.     Mental Status: She is alert. Mental status is at baseline.  Psychiatric:        Mood and Affect: Mood normal.        Behavior: Behavior normal.         Assessment & Plan:   1. Hypothyroidism, unspecified type: The patient is currently on levothyroxine 100 mcg daily, this was switched since her last set of labs the end of August.  Recheck today with full thyroid panel.  - Thyroid Panel With TSH  2. Iron deficiency anemia, unspecified iron deficiency anemia type: Is on iron supplements, recheck CBC.  - CBC w/Diff/Platelet  3. Hot flashes/Irregular periods/Nipple discharge: Reviewed mammogram and left breast ultrasound from 09/11/2022, both of which were negative for abnormalities.  We will pursue hormonal work-up with FSH/LH, estradiol and  testosterone levels, as well as prolactin.  - FSH/LH - Testosterone - Estradiol - Prolactin  4. Swelling of both hands/Chronic hypotension: Some symptoms she describes are consistent with autoimmune/inflammatory disorders, will obtain inflammatory labs and ANA screening labs.  Creatinine slightly increased on last set of labs, will recheck with CMP today.  Blood pressure appropriate today.  If blood work is completely normal, consider possible cardiac/pots syndrome.  Follow-up in 1 month for recheck.  - COMPLETE METABOLIC PANEL WITH GFR - C-reactive protein - Sed Rate (ESR) - Antinuclear Antib (ANA) - Metanephrines, plasma   Return in about 4 weeks (around 10/10/2022).   Teodora Medici, DO

## 2022-09-12 NOTE — Patient Instructions (Addendum)
It was great seeing you today!  Plan discussed at today's visit: -Blood work ordered today, results will be uploaded to [facility].   Follow up in: 1 month   Take care and let us know if you have any questions or concerns prior to your next visit.  Dr. Rosana Berger

## 2022-09-15 ENCOUNTER — Encounter: Payer: Self-pay | Admitting: Internal Medicine

## 2022-09-18 ENCOUNTER — Other Ambulatory Visit: Payer: Self-pay

## 2022-09-18 DIAGNOSIS — M7989 Other specified soft tissue disorders: Secondary | ICD-10-CM

## 2022-09-18 DIAGNOSIS — R768 Other specified abnormal immunological findings in serum: Secondary | ICD-10-CM

## 2022-09-18 DIAGNOSIS — R232 Flushing: Secondary | ICD-10-CM

## 2022-09-18 DIAGNOSIS — N926 Irregular menstruation, unspecified: Secondary | ICD-10-CM

## 2022-09-19 LAB — CBC WITH DIFFERENTIAL/PLATELET
Absolute Monocytes: 331 cells/uL (ref 200–950)
Basophils Absolute: 41 cells/uL (ref 0–200)
Basophils Relative: 0.7 %
Eosinophils Absolute: 99 cells/uL (ref 15–500)
Eosinophils Relative: 1.7 %
HCT: 41 % (ref 35.0–45.0)
Hemoglobin: 13.3 g/dL (ref 11.7–15.5)
Lymphs Abs: 1769 cells/uL (ref 850–3900)
MCH: 26.9 pg — ABNORMAL LOW (ref 27.0–33.0)
MCHC: 32.4 g/dL (ref 32.0–36.0)
MCV: 82.8 fL (ref 80.0–100.0)
MPV: 10.4 fL (ref 7.5–12.5)
Monocytes Relative: 5.7 %
Neutro Abs: 3561 cells/uL (ref 1500–7800)
Neutrophils Relative %: 61.4 %
Platelets: 297 10*3/uL (ref 140–400)
RBC: 4.95 10*6/uL (ref 3.80–5.10)
RDW: 13.6 % (ref 11.0–15.0)
Total Lymphocyte: 30.5 %
WBC: 5.8 10*3/uL (ref 3.8–10.8)

## 2022-09-19 LAB — ANTI-NUCLEAR AB-TITER (ANA TITER)
ANA TITER: 1:40 {titer} — ABNORMAL HIGH
ANA Titer 1: 1:40 {titer} — ABNORMAL HIGH

## 2022-09-19 LAB — COMPLETE METABOLIC PANEL WITH GFR
AG Ratio: 1.3 (calc) (ref 1.0–2.5)
ALT: 11 U/L (ref 6–29)
AST: 13 U/L (ref 10–30)
Albumin: 4.1 g/dL (ref 3.6–5.1)
Alkaline phosphatase (APISO): 71 U/L (ref 31–125)
BUN: 12 mg/dL (ref 7–25)
CO2: 27 mmol/L (ref 20–32)
Calcium: 9.2 mg/dL (ref 8.6–10.2)
Chloride: 106 mmol/L (ref 98–110)
Creat: 0.75 mg/dL (ref 0.50–0.97)
Globulin: 3.2 g/dL (calc) (ref 1.9–3.7)
Glucose, Bld: 91 mg/dL (ref 65–99)
Potassium: 4.1 mmol/L (ref 3.5–5.3)
Sodium: 140 mmol/L (ref 135–146)
Total Bilirubin: 0.4 mg/dL (ref 0.2–1.2)
Total Protein: 7.3 g/dL (ref 6.1–8.1)
eGFR: 107 mL/min/{1.73_m2} (ref >=60)

## 2022-09-19 LAB — TESTOSTERONE, TOTAL, LC/MS/MS: Testosterone, Total, LC-MS-MS: 12 ng/dL (ref 2–45)

## 2022-09-19 LAB — ESTRADIOL: Estradiol: 34 pg/mL

## 2022-09-19 LAB — THYROID PANEL WITH TSH
Free Thyroxine Index: 3.3 (ref 1.4–3.8)
T3 Uptake: 31 % (ref 22–35)
T4, Total: 10.6 ug/dL (ref 5.1–11.9)
TSH: 0.86 mIU/L

## 2022-09-19 LAB — METANEPHRINES, PLASMA
Metanephrine, Free: <25 pg/mL (ref <=57)
Normetanephrine, Free: 50 pg/mL (ref <=148)
Total Metanephrines-Plasma: 50 pg/mL (ref <=205)

## 2022-09-19 LAB — C-REACTIVE PROTEIN: CRP: 2.6 mg/L (ref <8.0)

## 2022-09-19 LAB — FSH/LH
FSH: 4.7 m[IU]/mL
LH: 3.3 m[IU]/mL

## 2022-09-19 LAB — SEDIMENTATION RATE: Sed Rate: 14 mm/h (ref 0–20)

## 2022-09-19 LAB — ANA: Anti Nuclear Antibody (ANA): POSITIVE — AB

## 2022-09-19 LAB — PROLACTIN: Prolactin: 6.6 ng/mL

## 2022-09-24 ENCOUNTER — Other Ambulatory Visit: Payer: Self-pay | Admitting: Internal Medicine

## 2022-09-24 DIAGNOSIS — I9589 Other hypotension: Secondary | ICD-10-CM

## 2022-09-24 NOTE — Telephone Encounter (Signed)
Dosage change - pt now taking 100. Requested Prescriptions  Pending Prescriptions Disp Refills  . levothyroxine (SYNTHROID) 88 MCG tablet [Pharmacy Med Name: LEVOTHYROXINE 88 MCG TABLET] 90 tablet 1    Sig: TAKE 1 TABLET BY MOUTH EVERY DAY BEFORE BREAKFAST     Endocrinology:  Hypothyroid Agents Passed - 09/24/2022  2:14 AM      Passed - TSH in normal range and within 360 days    TSH  Date Value Ref Range Status  09/12/2022 0.86 mIU/L Final    Comment:              Reference Range .           > or = 20 Years  0.40-4.50 .                Pregnancy Ranges           First trimester    0.26-2.66           Second trimester   0.55-2.73           Third trimester    0.43-2.91          Passed - Valid encounter within last 12 months    Recent Outpatient Visits          1 week ago Hypothyroidism, unspecified type   [place], DO   1 month ago Encounter for general adult medical examination with abnormal findings   [facility], Coralie Keens, NP   6 months ago Chronic hypotension   [facility] [place], NP   1 year ago Hypothyroidism due to acquired atrophy of thyroid   [facility] Olin Hauser, DO   2 years ago Well woman exam with routine gynecological exam   [facility], Lupita Raider, FNP      Future Appointments            In 2 weeks Teodora Medici, [facility], [facility]

## 2022-10-10 ENCOUNTER — Ambulatory Visit: Payer: Commercial Managed Care - PPO | Admitting: Internal Medicine

## 2022-10-24 ENCOUNTER — Ambulatory Visit: Payer: Commercial Managed Care - PPO | Admitting: Internal Medicine

## 2022-10-24 ENCOUNTER — Encounter: Payer: Self-pay | Admitting: Internal Medicine

## 2022-10-24 VITALS — BP 112/62 | HR 76 | Temp 98.4°F | Resp 16 | Ht 64.0 in | Wt 182.9 lb

## 2022-10-24 DIAGNOSIS — E538 Deficiency of other specified B group vitamins: Secondary | ICD-10-CM | POA: Diagnosis not present

## 2022-10-24 DIAGNOSIS — R3121 Asymptomatic microscopic hematuria: Secondary | ICD-10-CM | POA: Diagnosis not present

## 2022-10-24 DIAGNOSIS — G43109 Migraine with aura, not intractable, without status migrainosus: Secondary | ICD-10-CM | POA: Diagnosis not present

## 2022-10-24 DIAGNOSIS — M7989 Other specified soft tissue disorders: Secondary | ICD-10-CM

## 2022-10-24 LAB — POCT URINALYSIS DIPSTICK
Bilirubin, UA: NEGATIVE
Blood, UA: POSITIVE
Glucose, UA: NEGATIVE
Ketones, UA: NEGATIVE
Leukocytes, UA: NEGATIVE
Nitrite, UA: NEGATIVE
Odor: NORMAL
Protein, UA: NEGATIVE
Spec Grav, UA: 1.015
Urobilinogen, UA: 0.2 U/dL
pH, UA: 6

## 2022-10-24 MED ORDER — CYANOCOBALAMIN 1000 MCG/ML IJ SOLN: 1000.0000 ug | Freq: Once | INTRAMUSCULAR | Status: DC

## 2022-10-24 MED ORDER — CYANOCOBALAMIN 1000 MCG/ML IJ SOLN
1000.0000 ug | Freq: Once | INTRAMUSCULAR | Status: AC
  Administered 2022-10-24: 1000 ug via INTRAMUSCULAR

## 2022-10-24 NOTE — Patient Instructions (Addendum)
It was great seeing you today!  Plan discussed at today's visit: -Urine test today -Vitamin B12 injection - follow up for nurse's visit in 1 month for another injection and then 2 months to see me  Take care and let us know if you have any questions or concerns prior to your next visit.  Dr. Rosana Berger

## 2022-10-24 NOTE — Progress Notes (Signed)
Established Patient Office Visit  Subjective    Patient ID: Christine Herring, female    DOB: 05-02-1986  Age: 36 y.o. MRN: [id number]  CC:  Chief Complaint  Patient presents with   Follow-up    HPI Christine Herring presents to follow up.   At her last office visit, the patient was experiencing a myriad of vague symptoms that has started over the last several months.  She states she had an incident while she was at the beach when she broke out in hot flashes and hives all over her body.  At the time she felt like she was "burning from the inside".  She took Benadryl and used aloe, which controlled her symptoms.  Since then she has been having an intense pinching sensation of her left breast.  She recently had a mammogram and ultrasound of the left breast, both of which were normal.  She also states she has been having a milky like discharge from her left nipple.  Since the ultrasound, this has resolved but she is still having clear discharge from the nipple.  She does have a history of irregular and heavy periods.  She does have a history of tubal ligation.  She is also having extreme fatigue and sleepiness, as well as headaches almost on a daily basis.  She states light seems to trigger the headaches.  The pain is in between her eyes.  She is also having swelling "all over", mostly in her upper extremities, hands and fingers.  She states she was so swollen she could not put her rings on.  She also noticed her feet were swollen to the point where she could not fit into her normal size shoes.  For this she did take an over-the-counter diuretic but states this did not help.  She does have a history of having low drops in blood pressure, stating her blood pressure has dropped as low as 80/60.  She was evaluated by Rheumatology on 10/22/22, who did not think her symptoms were rheumatologic in nature.  She did have labs drawn, which showed a low vitamin B12 level as well as a few red blood cells on her urine.   Patient states she does not have any urinary symptoms, no dysuria, gross hematuria, increased urinary urgency or frequency.  She does endorse fatigue and numbness and tingling in her extremities.  Hypothyroidism: -Medications: Levothyroxine 100 mcg (had been on 88 mcg when her last set of labs were obtained) -Patient is compliant with the above medication (s) at the above dose -Last TSH: 4.92 08/21/22 -Following with Endocrinology, last seen on 09/02/22.  Asthma:  -Asthma status: stable -Current Treatments: Albuterol PRN -Satisfied with current treatment?: yes  Migraines: -Was seen by Neurology - planning for MRI 11/17. Seeing general Neurology in December.  -Does have some occasional headaches but no migraines  -Was started on Topamax but hasn't taken yet, is taking ibuprofen for the pain which does help -Now complaining for ear pressure bilaterally, slightly more worse in the right side with mastoid pain, some hearing changes. No tinnitus, no drainage. Does have some itching.   Seasonal Allergies: -Currently on Claritin as needed  Congenital Hip Dysplasia: -Following with [facility] -Underwent bilateral hip replacements -Did discuss potentially starting a GLP-1 for weight loss  History of Seizures: -In childhood  -Had been on Phenobarbital - had an unspecified reaction and is no longer on this medication.  She is currently not on any medications to prevent seizure-like activity. -States when she  passes out is when she has seizures, this has not happened since 2013 when she was overheated, lost consciousness and had seizure-like activity at that time -She does not have a neurologist who she follows with regularly.  Outpatient Encounter Medications as of 10/24/2022  Medication Sig   Emollient (COLLAGEN EX) Apply topically.   Ferrous Sulfate (IRON) 325 (65 Fe) MG TABS Take by mouth.   levothyroxine (SYNTHROID) 100 MCG tablet Take 1 tablet (100 mcg total) by mouth daily.    loratadine (CLARITIN) 10 MG tablet Take 10 mg by mouth daily.   Multiple Vitamin (MULTIVITAMIN) tablet Take 1 tablet by mouth daily.   Vitamin D, Ergocalciferol, (DRISDOL) 1.25 MG (50000 UNIT) CAPS capsule Take 1 capsule (50,000 Units total) by mouth once a week. For 12 weeks. Then start OTC Vitamin D3 2,000 unit daily.   [DISCONTINUED] albuterol (PROVENTIL HFA;VENTOLIN HFA) 108 (90 Base) MCG/ACT inhaler Inhale 2 puffs into the lungs every 6 (six) hours as needed for wheezing or shortness of breath.   [EXPIRED] cyanocobalamin (VITAMIN B12) injection 1,000 mcg    [DISCONTINUED] cyanocobalamin (VITAMIN B12) injection 1,000 mcg    No facility-administered encounter medications on file as of 10/24/2022.    Past Medical History:  Diagnosis Date   Anxiety    Hip dysplasia    Osteoporosis    Seizures ([place])    when she was a child reason not know   Thyroid disease     Past Surgical History:  Procedure Laterality Date   OVARIAN CYST REMOVAL     TOTAL HIP ARTHROPLASTY Bilateral    TUBAL LIGATION      Family History  Problem Relation Age of Onset   Thyroid disease Mother    Hyperlipidemia Father    Thyroid disease Father    Breast cancer Maternal Aunt    Colon cancer Maternal Aunt        grt    Ovarian cancer Paternal Grandmother    Diabetes Neg Hx    Heart disease Neg Hx     Social History   Socioeconomic History   Marital status: Single    Spouse name: Not on file   Number of children: Not on file   Years of education: Not on file   Highest education level: Not on file  Occupational History   Not on file  Tobacco Use   Smoking status: Former    Packs/day: 0.50    Years: 10.00    Total pack years: 5.00    Types: Cigarettes    Quit date: 07/16/2005    Years since quitting: 17.2   Smokeless tobacco: Never  Vaping Use   Vaping Use: Never used  Substance and Sexual Activity   Alcohol use: Yes    Comment: social   Drug use: No   Sexual activity: Yes    Birth  control/protection: Surgical  Other Topics Concern   Not on file  Social History Narrative   Not on file   Social Determinants of Health   Financial Resource Strain: Not on file  Food Insecurity: Not on file  Transportation Needs: Not on file  Physical Activity: Not on file  Stress: Not on file  Social Connections: Not on file  Intimate Partner Violence: Not on file    Review of Systems  Constitutional:  Positive for malaise/fatigue. Negative for chills and fever.  Respiratory:  Negative for shortness of breath.   Cardiovascular:  Positive for leg swelling. Negative for chest pain and palpitations.  Gastrointestinal:  Negative  for abdominal pain, constipation, diarrhea, nausea and vomiting.  Genitourinary:  Negative for dysuria, frequency, hematuria and urgency.  Musculoskeletal:  Positive for joint pain. Negative for myalgias.  Neurological:  Positive for tingling.      Objective    BP 112/62   Pulse 76   Temp 98.4 F (36.9 C)   Resp 16   Ht _0  (1.626 m)   Wt 182 lb 14.4 oz (83 kg)   SpO2 99%   BMI 31.39 kg/m   Physical Exam Constitutional:      Appearance: Normal appearance.  HENT:     Head: Normocephalic and atraumatic.  Eyes:     Conjunctiva/sclera: Conjunctivae normal.  Cardiovascular:     Rate and Rhythm: Normal rate and regular rhythm.  Pulmonary:     Effort: Pulmonary effort is normal.     Breath sounds: Normal breath sounds.  Musculoskeletal:     Right lower leg: No edema.     Left lower leg: No edema.  Skin:    General: Skin is warm and dry.  Neurological:     General: No focal deficit present.     Mental Status: She is alert. Mental status is at baseline.  Psychiatric:        Mood and Affect: Mood normal.        Behavior: Behavior normal.         Assessment & Plan:   1. Vitamin B12 deficiency: Vitamin B12 IM injection given today, she will follow-up in 1 month for a second dose and then in 2 months for third dose and plans to  recheck vitamin B12 levels at that time.  - cyanocobalamin (VITAMIN B12) injection 1,000 mcg  2. Asymptomatic microscopic hematuria: Some gross hematuria on UA from rheumatology office, will recheck today.  - POCT Urinalysis Dipstick - Urine Culture  3. Migraine with aura and without status migrainosus, not intractable: Stable.  Not currently taking any medication for headaches but less in frequency and severity.  Planning on MRI in 2 weeks.  4. Swelling of both hands: I do not recommend a diuretic for her to use as needed for swelling, as her blood pressure is borderline low.  Discussed using compression stockings and compression wraps to help with swelling in her hands, wrist and lower extremities.  Follow-up here in 2 months.   Return in about 2 months (around 12/24/2022) for 2 months for medical visit 1 month for vitamin B12 injeciton .   Teodora Medici, DO

## 2022-10-26 LAB — URINE CULTURE
MICRO NUMBER:: 14136411
Result:: NO GROWTH
SPECIMEN QUALITY:: ADEQUATE

## 2022-11-06 ENCOUNTER — Other Ambulatory Visit: Payer: Self-pay | Admitting: Internal Medicine

## 2022-11-06 NOTE — Telephone Encounter (Signed)
Requested medication (s) are due for refill today: no  Requested medication (s) are on the active medication list: yes  Last refill:  08/22/22 #12  Future visit scheduled: yes  Notes to clinic:  PEC NT not delegated to refuse med. Pt was to start OTC med   Requested Prescriptions  Pending Prescriptions Disp Refills   Vitamin D, Ergocalciferol, (DRISDOL) 1.25 MG (50000 UNIT) CAPS capsule [Pharmacy Med Name: VITAMIN D2 1.25MG (50,000 UNIT)] 12 capsule 0    Sig: TAKE 1 CAP BY MOUTH ONCE A WEEK. FOR 12 WEEKS. THEN START OTC VITAMIN D3 2,000 UNIT DAILY.     Endocrinology:  Vitamins - Vitamin D Supplementation 2 Failed - 11/06/2022 12:34 PM      Failed - Manual Review: Route requests for 50,000 IU strength to the provider      Failed - Vitamin D in normal range and within 360 days    Vit D, 25-Hydroxy  Date Value Ref Range Status  08/21/2022 19 (L) 30 - 100 ng/mL Final    Comment:    Vitamin D Status         25-OH Vitamin D: . Deficiency:                    <20 ng/mL Insufficiency:             20 - 29 ng/mL Optimal:                 > or = 30 ng/mL . For 25-OH Vitamin D testing on patients on  D2-supplementation and patients for whom quantitation  of D2 and D3 fractions is required, the [facility](TM) 25-OH VIT D, (D2,D3), LC/MS/MS is recommended: order  code 23557 (patients >51yrs). . See Note 1 . Note 1 . For additional information, please refer to  http://education.[facility].com/faq/FAQ199  (This link is being provided for informational/ educational purposes only.)          Passed - Ca in normal range and within 360 days    Calcium  Date Value Ref Range Status  09/12/2022 9.2 8.6 - 10.2 mg/dL Final         Passed - Valid encounter within last 12 months    Recent Outpatient Visits           1 week ago Vitamin B12 deficiency   [facility] Margarita Mail, DO   1 month ago Hypothyroidism, unspecified type   [facility] Margarita Mail, DO   2 months ago Encounter for general adult medical examination with abnormal findings   [facility] [place], Salvadore Oxford, NP   8 months ago Chronic hypotension   [facility] [place], [place], NP   1 year ago Hypothyroidism due to acquired atrophy of thyroid   [facility] Althea Charon, Netta Neat, DO       Future Appointments             In 1 month Margarita Mail, DO [facility], [facility]

## 2022-11-28 ENCOUNTER — Encounter: Payer: Self-pay | Admitting: Internal Medicine

## 2022-11-28 ENCOUNTER — Ambulatory Visit: Payer: Self-pay

## 2022-11-28 ENCOUNTER — Ambulatory Visit: Payer: Commercial Managed Care - PPO | Admitting: Internal Medicine

## 2022-11-28 VITALS — BP 116/64 | HR 94 | Temp 98.5°F | Resp 16 | Ht 64.0 in | Wt 177.9 lb

## 2022-11-28 DIAGNOSIS — L03316 Cellulitis of umbilicus: Secondary | ICD-10-CM | POA: Diagnosis not present

## 2022-11-28 DIAGNOSIS — K429 Umbilical hernia without obstruction or gangrene: Secondary | ICD-10-CM | POA: Diagnosis not present

## 2022-11-28 MED ORDER — DOXYCYCLINE HYCLATE 100 MG PO TABS: 100.0000 mg | ORAL_TABLET | Freq: Two times a day (BID) | ORAL | 0 refills | Status: AC

## 2022-11-28 NOTE — Telephone Encounter (Signed)
  Chief Complaint: navel pain  Symptoms: wore a belt and thinks it dug into navel, now with circumferential redness extending 1 inch and tenderness Frequency: 3 days Pertinent Negatives: Patient denies back pain, diarrhea, fever,shooting elsewhere pain, drainage Disposition: [] ED /[] Urgent Care (no appt availability in office) / [x] Appointment(In office/virtual)/ []  [place] [facility]/ [] Home Care/ [] Refused Recommended Disposition /[] [place] [facility]/ []  Follow-up with PCP Additional Notes: advised to outline the redness to determine if worsening Reason for Disposition  [1] MODERATE pain (e.g., interferes with normal activities) AND [2] pain comes and goes (cramps) AND [3] present > 24 hours  (Exception: Pain with Vomiting or Diarrhea - see that Guideline.)  Answer Assessment - Initial Assessment Questions 1. LOCATION: "Where does it hurt?"      [place] area 2. RADIATION: "Does the pain shoot anywhere else?" (e.g., chest, back)     no 3. ONSET: "When did the pain begin?" (e.g., minutes, hours or days ago)      3 days  4. SUDDEN: "Gradual or sudden onset?"     *No Answer* 5. PATTERN "Does the pain come and go, or is it constant?"    - If it comes and goes: "How long does it last?" "Do you have pain now?"     (Note: Comes and goes means the pain is intermittent. It goes away completely between bouts.)    - If constant: "Is it getting better, staying the same, or getting worse?"      (Note: Constant means the pain never goes away completely; most serious pain is constant and gets worse.)      *No Answer* 6. SEVERITY: "How bad is the pain?"  (e.g., Scale 1-10; mild, moderate, or severe)    - MILD (1-3): Doesn't interfere with normal activities, abdomen soft and not tender to touch.     - MODERATE (4-7): Interferes with normal activities or awakens from sleep, abdomen tender to touch.     - SEVERE (8-10): Excruciating pain, doubled over, unable to do any normal activities.        4 7. RECURRENT SYMPTOM: "Have you ever had this type of stomach pain before?" If Yes, ask: "When was the last time?" and "What happened that time?"      no 8. CAUSE: "What do you think is causing the stomach pain?"     Belt too tight 9. RELIEVING/AGGRAVATING FACTORS: "What makes it better or worse?" (e.g., antacids, bending or twisting motion, bowel movement)     N/a 10. OTHER SYMPTOMS: "Do you have any other symptoms?" (e.g., back pain, diarrhea, fever, urination pain, vomiting)       Tender and red swollen warmer- 1 inch 11. PREGNANCY: "Is there any chance you are pregnant?" "When was your last menstrual period?"       N/a  Protocols used: Abdominal Pain - [facility]

## 2022-11-28 NOTE — Progress Notes (Signed)
   Acute Office Visit  Subjective:     Patient ID: Christine Herring, female    DOB: August 31, 1986, 36 y.o.   MRN: [id number]  Chief Complaint  Patient presents with   navel pain    HPI Patient is in today for rash/naval pain. Had an umbilical hernia when she was pregnant 10 years ago that is normally not painful however last week she started having redness, pain and swelling. She did wear a new belt which irritated the skin but no other changes. No trauma or heavy lifting. No other skin changes. No fevers. No drainage.   Review of Systems  Constitutional:  Negative for chills and fever.  Skin:  Positive for rash.      Objective:    BP 116/64   Pulse 94   Temp 98.5 F (36.9 C)   Resp 16   Ht 5\' 4"  (1.626 m)   Wt 177 lb 14.4 oz (80.7 kg)   LMP 11/13/2022   SpO2 99%   BMI 30.54 kg/m  BP Readings from Last 3 Encounters:  11/28/22 116/64  10/24/22 112/62  09/12/22 116/78   Wt Readings from Last 3 Encounters:  11/28/22 177 lb 14.4 oz (80.7 kg)  10/24/22 182 lb 14.4 oz (83 kg)  09/12/22 176 lb 8 oz (80.1 kg)      Physical Exam Constitutional:      Appearance: Normal appearance.  HENT:     Head: Normocephalic and atraumatic.  Eyes:     Conjunctiva/sclera: Conjunctivae normal.  Cardiovascular:     Rate and Rhythm: Normal rate and regular rhythm.  Pulmonary:     Effort: Pulmonary effort is normal.     Breath sounds: Normal breath sounds.  Skin:    General: Skin is warm and dry.     Findings: Rash present.     Comments: Umbilical hernia protruding, swelling with redness and pain. No drainage present.   Neurological:     General: No focal deficit present.     Mental Status: She is alert. Mental status is at baseline.  Psychiatric:        Mood and Affect: Mood normal.        Behavior: Behavior normal.     No results found for any visits on 11/28/22.      Assessment & Plan:   1. Cellulitis of umbilicus/Umbilical hernia without obstruction and without gangrene:  Concerning for underlying abscess, CT for further evaluation. Start Doxycycline 100 mg BID x 7 days.   - doxycycline (VIBRA-TABS) 100 MG tablet; Take 1 tablet (100 mg total) by mouth 2 (two) times daily for 7 days.  Dispense: 14 tablet; Refill: 0 - CT Abdomen Pelvis W Contrast; Future  Return if symptoms worsen or fail to improve.  14/07/23, DO

## 2022-12-05 ENCOUNTER — Ambulatory Visit: Admission: RE | Admit: 2022-12-05 | Payer: Commercial Managed Care - PPO | Source: Ambulatory Visit

## 2022-12-25 NOTE — Progress Notes (Deleted)
Established Patient Office Visit  Subjective    Patient ID: Christine Herring, female    DOB: 04/15/1986  Age: 37 y.o. MRN: [id number]  CC:  No chief complaint on file.   HPI Christine Herring presents to follow up.   At her last office visit, the patient was experiencing a myriad of vague symptoms that has started over the last several months.  She states she had an incident while she was at the beach when she broke out in hot flashes and hives all over her body.  At the time she felt like she was "burning from the inside".  She took Benadryl and used aloe, which controlled her symptoms.  Since then she has been having an intense pinching sensation of her left breast.  She recently had a mammogram and ultrasound of the left breast, both of which were normal.  She also states she has been having a milky like discharge from her left nipple.  Since the ultrasound, this has resolved but she is still having clear discharge from the nipple.  She does have a history of irregular and heavy periods.  She does have a history of tubal ligation.  She is also having extreme fatigue and sleepiness, as well as headaches almost on a daily basis.  She states light seems to trigger the headaches.  The pain is in between her eyes.  She is also having swelling "all over", mostly in her upper extremities, hands and fingers.  She states she was so swollen she could not put her rings on.  She also noticed her feet were swollen to the point where she could not fit into her normal size shoes.  For this she did take an over-the-counter diuretic but states this did not help.  She does have a history of having low drops in blood pressure, stating her blood pressure has dropped as low as 80/60.  She was evaluated by Rheumatology on 10/22/22, who did not think her symptoms were rheumatologic in nature.  She did have labs drawn, which showed a low vitamin B12 level as well as a few red blood cells on her urine.  Patient states she does not  have any urinary symptoms, no dysuria, gross hematuria, increased urinary urgency or frequency.  She does endorse fatigue and numbness and tingling in her extremities.  Hypothyroidism: -Medications: Levothyroxine 100 mcg (had been on 88 mcg when her last set of labs were obtained) -Patient is compliant with the above medication (s) at the above dose -Last TSH: 4.92 08/21/22 -Following with Endocrinology, last seen on 09/02/22.  Asthma:  -Asthma status: stable -Current Treatments: Albuterol PRN -Satisfied with current treatment?: yes  Migraines: -Was seen by Neurology - planning for MRI 11/17. Seeing general Neurology in December.  -Does have some occasional headaches but no migraines  -Was started on Topamax but hasn't taken yet, is taking ibuprofen for the pain which does help -Now complaining for ear pressure bilaterally, slightly more worse in the right side with mastoid pain, some hearing changes. No tinnitus, no drainage. Does have some itching.   Seasonal Allergies: -Currently on Claritin as needed  Congenital Hip Dysplasia: -Following with [facility] -Underwent bilateral hip replacements -Did discuss potentially starting a GLP-1 for weight loss  History of Seizures: -In childhood  -Had been on Phenobarbital - had an unspecified reaction and is no longer on this medication.  She is currently not on any medications to prevent seizure-like activity. -States when she passes out is when she  has seizures, this has not happened since 2013 when she was overheated, lost consciousness and had seizure-like activity at that time -She does not have a neurologist who she follows with regularly.  Outpatient Encounter Medications as of 12/26/2022  Medication Sig   Emollient (COLLAGEN EX) Apply topically.   Ferrous Sulfate (IRON) 325 (65 Fe) MG TABS Take by mouth.   levothyroxine (SYNTHROID) 100 MCG tablet Take 1 tablet (100 mcg total) by mouth daily.   loratadine (CLARITIN) 10 MG  tablet Take 10 mg by mouth daily.   Multiple Vitamin (MULTIVITAMIN) tablet Take 1 tablet by mouth daily.   Vitamin D, Ergocalciferol, (DRISDOL) 1.25 MG (50000 UNIT) CAPS capsule Take 1 capsule (50,000 Units total) by mouth once a week. For 12 weeks. Then start OTC Vitamin D3 2,000 unit daily.   No facility-administered encounter medications on file as of 12/26/2022.    Past Medical History:  Diagnosis Date   Anxiety    Hip dysplasia    Osteoporosis    Seizures ([place])    when she was a child reason not know   Thyroid disease     Past Surgical History:  Procedure Laterality Date   OVARIAN CYST REMOVAL     TOTAL HIP ARTHROPLASTY Bilateral    TUBAL LIGATION      Family History  Problem Relation Age of Onset   Thyroid disease Mother    Hyperlipidemia Father    Thyroid disease Father    Breast cancer Maternal Aunt    Colon cancer Maternal Aunt        grt    Ovarian cancer Paternal Grandmother    Diabetes Neg Hx    Heart disease Neg Hx     Social History   Socioeconomic History   Marital status: Single    Spouse name: Not on file   Number of children: Not on file   Years of education: Not on file   Highest education level: Not on file  Occupational History   Not on file  Tobacco Use   Smoking status: Former    Packs/day: 0.50    Years: 10.00    Total pack years: 5.00    Types: Cigarettes    Quit date: 07/16/2005    Years since quitting: 17.4   Smokeless tobacco: Never  Vaping Use   Vaping Use: Never used  Substance and Sexual Activity   Alcohol use: Yes    Comment: social   Drug use: No   Sexual activity: Yes    Birth control/protection: Surgical  Other Topics Concern   Not on file  Social History Narrative   Not on file   Social Determinants of Health   Financial Resource Strain: Not on file  Food Insecurity: Not on file  Transportation Needs: Not on file  Physical Activity: Not on file  Stress: Not on file  Social Connections: Not on file  Intimate  Partner Violence: Not on file    Review of Systems  Constitutional:  Positive for malaise/fatigue. Negative for chills and fever.  Respiratory:  Negative for shortness of breath.   Cardiovascular:  Positive for leg swelling. Negative for chest pain and palpitations.  Gastrointestinal:  Negative for abdominal pain, constipation, diarrhea, nausea and vomiting.  Genitourinary:  Negative for dysuria, frequency, hematuria and urgency.  Musculoskeletal:  Positive for joint pain. Negative for myalgias.  Neurological:  Positive for tingling.      Objective    LMP 11/13/2022   Physical Exam Constitutional:  Appearance: Normal appearance.  HENT:     Head: Normocephalic and atraumatic.  Eyes:     Conjunctiva/sclera: Conjunctivae normal.  Cardiovascular:     Rate and Rhythm: Normal rate and regular rhythm.  Pulmonary:     Effort: Pulmonary effort is normal.     Breath sounds: Normal breath sounds.  Musculoskeletal:     Right lower leg: No edema.     Left lower leg: No edema.  Skin:    General: Skin is warm and dry.  Neurological:     General: No focal deficit present.     Mental Status: She is alert. Mental status is at baseline.  Psychiatric:        Mood and Affect: Mood normal.        Behavior: Behavior normal.         Assessment & Plan:   1. Vitamin B12 deficiency: Vitamin B12 IM injection given today, she will follow-up in 1 month for a second dose and then in 2 months for third dose and plans to recheck vitamin B12 levels at that time.  - cyanocobalamin (VITAMIN B12) injection 1,000 mcg  2. Asymptomatic microscopic hematuria: Some gross hematuria on UA from rheumatology office, will recheck today.  - POCT Urinalysis Dipstick - Urine Culture  3. Migraine with aura and without status migrainosus, not intractable: Stable.  Not currently taking any medication for headaches but less in frequency and severity.  Planning on MRI in 2 weeks.  4. Swelling of both hands:  I do not recommend a diuretic for her to use as needed for swelling, as her blood pressure is borderline low.  Discussed using compression stockings and compression wraps to help with swelling in her hands, wrist and lower extremities.  Follow-up here in 2 months.   No follow-ups on file.   Teodora Medici, DO

## 2022-12-26 ENCOUNTER — Ambulatory Visit: Payer: Commercial Managed Care - PPO | Admitting: Internal Medicine

## 2023-02-17 ENCOUNTER — Other Ambulatory Visit: Payer: Self-pay | Admitting: Internal Medicine

## 2023-02-18 NOTE — Telephone Encounter (Signed)
Requested Prescriptions  Pending Prescriptions Disp Refills   levothyroxine (SYNTHROID) 100 MCG tablet [Pharmacy Med Name: LEVOTHYROXINE 100 MCG TABLET] 90 tablet 0    Sig: TAKE 1 TABLET BY MOUTH EVERY DAY     Endocrinology:  Hypothyroid Agents Passed - 02/17/2023  8:35 AM      Passed - TSH in normal range and within 360 days    TSH  Date Value Ref Range Status  09/12/2022 0.86 mIU/L Final    Comment:              Reference Range .           > or = 20 Years  0.40-4.50 .                Pregnancy Ranges           First trimester    0.26-2.66           Second trimester   0.55-2.73           Third trimester    0.43-2.91          Passed - Valid encounter within last 12 months    Recent Outpatient Visits           2 months ago Cellulitis of umbilicus   [place], DO   3 months ago Vitamin B12 deficiency   [facility] Teodora Medici, DO   5 months ago Hypothyroidism, unspecified type   [facility] Teodora Medici, DO   6 months ago Encounter for general adult medical examination with abnormal findings   [facility] [place], Coralie Keens, NP   11 months ago Chronic hypotension   [facility] [place], Coralie Keens, [place]

## 2023-04-14 ENCOUNTER — Ambulatory Visit (INDEPENDENT_AMBULATORY_CARE_PROVIDER_SITE_OTHER): Payer: Commercial Managed Care - PPO | Admitting: Family Medicine

## 2023-04-14 ENCOUNTER — Encounter: Payer: Self-pay | Admitting: Family Medicine

## 2023-04-14 DIAGNOSIS — E538 Deficiency of other specified B group vitamins: Secondary | ICD-10-CM | POA: Insufficient documentation

## 2023-04-14 DIAGNOSIS — Z9851 Tubal ligation status: Secondary | ICD-10-CM | POA: Diagnosis not present

## 2023-04-14 NOTE — Progress Notes (Signed)
Name: Christine Herring   MRN: [id number]    DOB: 1986/06/13   Date:04/14/2023       Progress Note  Subjective:    Chief Complaint  Chief Complaint  Patient presents with   Consult    Pt [place] an accident out [place] and had x-rays done which to the providers who reviewed her x-ray stated to her, tubes do not look they were tied correctly. Pt does not have an OB/GYN    I connected with  Maxcine Ham  on 04/14/23 at  2:20 PM EDT by a video enabled telemedicine application and verified that I am speaking with the correct person using two identifiers.  I discussed the limitations of evaluation and management by telemedicine and the availability of in person appointments. The patient expressed understanding and agreed to proceed. Staff also discussed with the patient that there may be a patient responsible charge related to this service. Patient Location: home Provider Location: [facility] office Additional Individuals present:   HPI  Pt recently had pelvic Xrays for a fall/injury when out of country - They asked her about prior tubal ligation surgery, they only saw one clip and not even in the correct location   She had tubal ligation in 2013 after the birth of her last child, was done [facility] [place] She has most recently seen Dr. Erlene Quan and been to [facility], but she has not needed Gyn care for the last couple years so she is not est anywhere  She has done many xrays and imaging over the past several years at [facility] for bilateral hip issues - eventually with bilateral hip replacement -her imaging at [facility] over the years has mentioned bilateral clips being visible the last imaging that I can see through care everywhere mention this January 2018.  She states she can see another images by physically looking at the pictures that they are not bilateral clips.  She did reach out to [facility] the new practice and they told her they are not accepting new patients and could not see her  until October.  Regarding her recent x-rays out of country she does not have any records of the report or pictures with her      Patient Active Problem List   Diagnosis Date Noted   Class 1 obesity due to excess calories with body mass index (BMI) of 31.0 to 31.9 in adult 08/21/2022   Osteoarthritis 02/28/2022   Anxiety 02/28/2022   History of seizures 02/28/2022   Pure hypercholesterolemia 02/28/2022   Iron deficiency anemia 09/17/2016   Chronic hypotension 07/02/2016   Hypothyroidism 07/17/2015    Social History   Tobacco Use   Smoking status: Former    Packs/day: 0.50    Years: 10.00    Additional pack years: 0.00    Total pack years: 5.00    Types: Cigarettes    Quit date: 07/16/2005    Years since quitting: 17.7   Smokeless tobacco: Never  Substance Use Topics   Alcohol use: Yes    Comment: social     Current Outpatient Medications:    Emollient (COLLAGEN EX), Apply topically., Disp: , Rfl:    levothyroxine (SYNTHROID) 100 MCG tablet, TAKE 1 TABLET BY MOUTH EVERY DAY, Disp: 90 tablet, Rfl: 0   loratadine (CLARITIN) 10 MG tablet, Take 10 mg by mouth daily., Disp: , Rfl:    Multiple Vitamin (MULTIVITAMIN) tablet, Take 1 tablet by mouth daily., Disp: , Rfl:    Vitamin D,  Ergocalciferol, (DRISDOL) 1.25 MG (50000 UNIT) CAPS capsule, Take 1 capsule (50,000 Units total) by mouth once a week. For 12 weeks. Then start OTC Vitamin D3 2,000 unit daily., Disp: 12 capsule, Rfl: 0   Ferrous Sulfate (IRON) 325 (65 Fe) MG TABS, Take by mouth. (Patient not taking: Reported on 04/14/2023), Disp: , Rfl:   Allergies  Allergen Reactions   Peanut Oil Hives    I personally reviewed active problem list, medication list, allergies, family history, social history, health maintenance, notes from last encounter, lab results, imaging with the patient/caregiver today.   Review of Systems  Constitutional: Negative.   HENT: Negative.    Eyes: Negative.   Respiratory: Negative.     Cardiovascular: Negative.   Gastrointestinal: Negative.   Endocrine: Negative.   Genitourinary: Negative.   Musculoskeletal: Negative.   Skin: Negative.   Allergic/Immunologic: Negative.   Neurological: Negative.   Hematological: Negative.   Psychiatric/Behavioral: Negative.    All other systems reviewed and are negative.     Objective:   Virtual encounter, vitals limited, only able to obtain the following There were no vitals filed for this visit. There is no height or weight on file to calculate BMI. Nursing Note and Vital Signs reviewed.  Physical Exam Vitals and nursing note reviewed.  Constitutional:      General: She is not in acute distress.    Appearance: Normal appearance. She is well-developed. She is not ill-appearing, toxic-appearing or diaphoretic.  HENT:     Head: Normocephalic and atraumatic.  Neck:     Trachea: No tracheal deviation.  Pulmonary:     Effort: Pulmonary effort is normal. No respiratory distress.  Neurological:     Mental Status: She is alert.     Motor: No abnormal muscle tone.     Coordination: Coordination normal.  Psychiatric:        Mood and Affect: Mood normal.        Behavior: Behavior normal.     PE limited by virtual encounter  No results found for this or any previous visit (from the past 72 hour(s)).  Assessment and Plan:     ICD-10-CM   1. History of bilateral tubal ligation  Z98.51 Ambulatory referral to Gynecology    CANCELED: Ambulatory referral to Obstetrics / Gynecology   concern about clips no longer being visible in imaging recently and as shes looked back through chart - tubal done 05/2012      I have done as much research as I can on this topic - tubal ligation with clip failure rates, device migration etc.   I cannot see prior surgical info only what was reported Last xrays in chart with noted bilateral clips in 12/2016, no mention of that afterwards I have researched the topic to help answer the question -  since women in general do not get monitoring of clips to prove their continued effectiveness -  Per UTD Device migration -- For permanent contraception procedures using a clip, delayed migration or expulsion via the urethra, bladder, vagina, or rectum have been reported, but are uncommon events. Based upon available data, it appears that such events are not associated with failed tubal occlusion or other significant morbidity, as the tubal segment remains obstructed from the previous clip placement [9,38]. Silicone bands are often seen to be peritonealized and still attached to the mesosalpinx or even found elsewhere in the pelvis at the time of subsequent surgery, with no reports of failure or adverse outcomes related to migration.  I have sent this info with additional commentary to pt via my chart. I do not think she has to see GYN - I will try to verfiy with colleague that there is no concern or need for consult or work up.  - I discussed the assessment and treatment plan with the patient. The patient was provided an opportunity to ask questions and all were answered. The patient agreed with the plan and demonstrated an understanding of the instructions.  I provided 20+ minutes of non-face-to-face time during this encounter.  Danelle Berry, PA-C 04/14/23 2:58 PM

## 2023-04-15 ENCOUNTER — Encounter: Payer: Self-pay | Admitting: Family Medicine

## 2023-05-08 ENCOUNTER — Other Ambulatory Visit: Payer: Self-pay | Admitting: Obstetrics and Gynecology

## 2023-05-08 DIAGNOSIS — Z01818 Encounter for other preprocedural examination: Secondary | ICD-10-CM

## 2023-05-12 ENCOUNTER — Ambulatory Visit
Admission: RE | Admit: 2023-05-12 | Discharge: 2023-05-12 | Disposition: A | Discharge status: Home / Self Care | Payer: Commercial Managed Care - PPO | Source: Ambulatory Visit | Attending: Obstetrics and Gynecology | Admitting: Obstetrics and Gynecology

## 2023-05-12 DIAGNOSIS — Z9851 Tubal ligation status: Secondary | ICD-10-CM | POA: Insufficient documentation

## 2023-05-12 DIAGNOSIS — Z01818 Encounter for other preprocedural examination: Secondary | ICD-10-CM | POA: Diagnosis present

## 2023-05-12 MED ORDER — IOHEXOL 300 MG/ML  SOLN
50.0000 mL | Freq: Once | INTRAMUSCULAR | Status: AC | PRN
  Administered 2023-05-12: 20 mL

## 2023-05-12 NOTE — Procedures (Signed)
Prep and drape done.  Cervix normal.  Catheter placed into uterine cavity without resistance. Approximately 12 mL dye injected under fluoroscopic visualization.  No patency of the bilateral fallopian tubes.  No spill from the cornual region.  There was a small amount of contrast at the lower pelvis. However, based on the contour of this fluid it appeared to be escaping through vagina as a result of flowing around the balloon catheter.  Catheter removed.  Patient stable, tolerated procedure well.   Thomasene Mohair, MD, [facility] OB/GYN 05/12/2023 12:24 PM

## 2023-05-15 ENCOUNTER — Other Ambulatory Visit: Payer: Self-pay | Admitting: Internal Medicine

## 2023-05-15 NOTE — Telephone Encounter (Signed)
Requested Prescriptions  Pending Prescriptions Disp Refills   levothyroxine (SYNTHROID) 100 MCG tablet [Pharmacy Med Name: LEVOTHYROXINE 100 MCG TABLET] 90 tablet 0    Sig: TAKE 1 TABLET BY MOUTH EVERY DAY     Endocrinology:  Hypothyroid Agents Passed - 05/15/2023  2:09 AM      Passed - TSH in normal range and within 360 days    TSH  Date Value Ref Range Status  09/12/2022 0.86 mIU/L Final    Comment:              Reference Range .           > or = 20 Years  0.40-4.50 .                Pregnancy Ranges           First trimester    0.26-2.66           Second trimester   0.55-2.73           Third trimester    0.43-2.91          Passed - Valid encounter within last 12 months    Recent Outpatient Visits           1 month ago History of bilateral tubal ligation   [facility] [place], Sheliah Mends, PA-C   5 months ago Cellulitis of umbilicus   [facility] Margarita Mail, DO   6 months ago Vitamin B12 deficiency   [facility] Margarita Mail, DO   8 months ago Hypothyroidism, unspecified type   [facility] Margarita Mail, DO   8 months ago Encounter for general adult medical examination with abnormal findings   [facility] [place], Salvadore Oxford, NP

## 2023-07-10 ENCOUNTER — Ambulatory Visit: Payer: Self-pay | Admitting: *Deleted

## 2023-07-10 NOTE — Telephone Encounter (Signed)
  Chief Complaint: Sinus pressure and pain Symptoms: Fever 101, right earache, sinus pressure, voice is hoarse, body aches and headaches Frequency: Started Tues evening Pertinent Negatives: Patient denies green mucus.    Disposition: [] ED /[x] Urgent Care (no appt availability in office) / [] Appointment(In office/virtual)/ []  [place] [facility]/ [] Home Care/ [] Refused Recommended Disposition /[] [place] [facility]/ []  Follow-up with PCP Additional Notes: There are no appts available at [facility] with any of the providers.   I offered her an appt with the float provider Jacquelin Hawking, PA-C at [facility] as she had openings today.   I also offered her a virtual visit via MyChart however she decided she preferred to go down the street to the urgent care now because she feels so bad.

## 2023-07-10 NOTE — Telephone Encounter (Signed)
Reason for Disposition  [1] Sinus pain (not just congestion) AND [2] fever  Answer Assessment - Initial Assessment Questions 1. LOCATION: "Where does it hurt?"      I have a lot of congestion and sinus pressure and right ear pressure. 2. ONSET: "When did the sinus pain start?"  (e.g., hours, days)      Tues. Night with right ear pressure and then a lot of sinus pressure.  Yesterday the body aches started.   The sinus pressure and headache are bad and I'm losing my voice.   She is hoarse sounding.    I'm having a lot of drainage down the back of my throat.  Not in chest.   Yesterday I had a fever 101 was the highest.    3. SEVERITY: "How bad is the pain?"   (Scale 1-10; mild, moderate or severe)   - MILD (1-3): doesn't interfere with normal activities    - MODERATE (4-7): interferes with normal activities (e.g., work or school) or awakens from sleep   - SEVERE (8-10): excruciating pain and patient unable to do any normal activities        Moderate sinus pressure 4. RECURRENT SYMPTOM: "Have you ever had sinus problems before?" If Yes, ask: "When was the last time?" and "What happened that time?"      No 5. NASAL CONGESTION: "Is the nose blocked?" If Yes, ask: "Can you open it or must you breathe through your mouth?"     Yes 6. NASAL DISCHARGE: "Do you have discharge from your nose?" If so ask, "What color?"     It's clear liquid. 7. FEVER: "Do you have a fever?" If Yes, ask: "What is it, how was it measured, and when did it start?"      Yes 101 8. OTHER SYMPTOMS: "Do you have any other symptoms?" (e.g., sore throat, cough, earache, difficulty breathing)     Post nasal drainage, fever, earache 9. PREGNANCY: "Is there any chance you are pregnant?" "When was your last menstrual period?"     Not asked  Protocols used: Sinus Pain or Congestion-A-AH

## 2023-08-19 ENCOUNTER — Other Ambulatory Visit: Payer: Self-pay | Admitting: Internal Medicine

## 2023-08-19 NOTE — Telephone Encounter (Signed)
Patient will need office visit and labs for further refills. Requested Prescriptions  Pending Prescriptions Disp Refills   levothyroxine (SYNTHROID) 100 MCG tablet [Pharmacy Med Name: LEVOTHYROXINE 100 MCG TABLET] 90 tablet 0    Sig: TAKE 1 TABLET BY MOUTH EVERY DAY     Endocrinology:  Hypothyroid Agents Passed - 08/19/2023  2:45 AM      Passed - TSH in normal range and within 360 days    TSH  Date Value Ref Range Status  09/12/2022 0.86 mIU/L Final    Comment:              Reference Range .           > or = 20 Years  0.40-4.50 .                Pregnancy Ranges           First trimester    0.26-2.66           Second trimester   0.55-2.73           Third trimester    0.43-2.91          Passed - Valid encounter within last 12 months    Recent Outpatient Visits           4 months ago History of bilateral tubal ligation   [facility] [place], Sheliah Mends, PA-C   8 months ago Cellulitis of umbilicus   [facility] Margarita Mail, DO   9 months ago Vitamin B12 deficiency   [facility] Margarita Mail, DO   11 months ago Hypothyroidism, unspecified type   [facility] Margarita Mail, DO   12 months ago Encounter for general adult medical examination with abnormal findings   [facility] [place], Salvadore Oxford, NP

## 2023-12-02 NOTE — Progress Notes (Unsigned)
Established Patient Office Visit  Subjective    Patient ID: Christine Herring, female    DOB: 1986/08/16  Age: 37 y.o. MRN: [id number]  CC:  No chief complaint on file.   HPI Christine Herring presents to follow up on chronic medical conditions.   Hypothyroidism: -Medications: Levothyroxine 100 mcg (had been on 88 mcg when her last set of labs were obtained) -Patient is compliant with the above medication (s) at the above dose -Last TSH: 4.92 08/21/22 -Following with Endocrinology, last seen on 09/02/22.  Asthma:  -Asthma status: stable -Current Treatments: Albuterol PRN -Satisfied with current treatment?: yes  Seasonal Allergies: -Currently on Claritin as needed  Congenital Hip Dysplasia: -Following with [facility] -Underwent bilateral hip replacements  History of Seizures: -In childhood  -Had been on Phenobarbital - had an unspecified reaction and is no longer on this medication.  She is currently not on any medications to prevent seizure-like activity. -States when she passes out is when she has seizures, this has not happened since 2013 when she was overheated, lost consciousness and had seizure-like activity at that time -She does not have a neurologist who she follows with regularly.  Health Maintenance: -Blood work due -Pap 9/21 negaive   Outpatient Encounter Medications as of 12/03/2023  Medication Sig   Emollient (COLLAGEN EX) Apply topically.   Ferrous Sulfate (IRON) 325 (65 Fe) MG TABS Take by mouth. (Patient not taking: Reported on 04/14/2023)   levothyroxine (SYNTHROID) 100 MCG tablet TAKE 1 TABLET BY MOUTH EVERY DAY   loratadine (CLARITIN) 10 MG tablet Take 10 mg by mouth daily.   Multiple Vitamin (MULTIVITAMIN) tablet Take 1 tablet by mouth daily.   Vitamin D, Ergocalciferol, (DRISDOL) 1.25 MG (50000 UNIT) CAPS capsule Take 1 capsule (50,000 Units total) by mouth once a week. For 12 weeks. Then start OTC Vitamin D3 2,000 unit daily.   No  facility-administered encounter medications on file as of 12/03/2023.    Past Medical History:  Diagnosis Date   Anxiety    Hip dysplasia    Osteoporosis    Seizures (HCC)    when she was a child reason not know   Thyroid disease     Past Surgical History:  Procedure Laterality Date   OVARIAN CYST REMOVAL     TOTAL HIP ARTHROPLASTY Bilateral    TUBAL LIGATION Bilateral 05/23/2012   [facility]    Family History  Problem Relation Age of Onset   Thyroid disease Mother    Hyperlipidemia Father    Thyroid disease Father    Breast cancer Maternal Aunt    Colon cancer Maternal Aunt        grt    Ovarian cancer Paternal Grandmother    Diabetes Neg Hx    Heart disease Neg Hx     Social History   Socioeconomic History   Marital status: Single    Spouse name: Not on file   Number of children: Not on file   Years of education: Not on file   Highest education level: Not on file  Occupational History   Not on file  Tobacco Use   Smoking status: Former    Current packs/day: 0.00    Average packs/day: 0.5 packs/day for 10.0 years (5.0 ttl pk-yrs)    Types: Cigarettes    Start date: 07/17/1995    Quit date: 07/16/2005    Years since quitting: 18.3   Smokeless tobacco: Never  Vaping Use   Vaping status: Never Used  Substance and  Sexual Activity   Alcohol use: Yes    Comment: social   Drug use: No   Sexual activity: Yes    Birth control/protection: Surgical  Other Topics Concern   Not on file  Social History Narrative   Not on file   Social Determinants of Health   Financial Resource Strain: Not on file  Food Insecurity: Not on file  Transportation Needs: Not on file  Physical Activity: Not on file  Stress: Not on file  Social Connections: Not on file  Intimate Partner Violence: Not on file    Review of Systems  Constitutional:  Positive for malaise/fatigue. Negative for chills and fever.  Eyes:  Positive for blurred vision.  Respiratory:  Negative for  shortness of breath.   Cardiovascular:  Negative for chest pain and palpitations.  Gastrointestinal:  Negative for abdominal pain, constipation, diarrhea, nausea and vomiting.  Genitourinary:  Negative for dysuria and hematuria.  Musculoskeletal:  Negative for joint pain and myalgias.  Skin:  Positive for itching and rash.  Neurological:  Positive for headaches.        Objective    There were no vitals taken for this visit.  Physical Exam Constitutional:      Appearance: Normal appearance.  HENT:     Head: Normocephalic and atraumatic.     Mouth/Throat:     Mouth: Mucous membranes are moist.     Pharynx: Oropharynx is clear.  Eyes:     Extraocular Movements: Extraocular movements intact.     Conjunctiva/sclera: Conjunctivae normal.     Pupils: Pupils are equal, round, and reactive to light.  Cardiovascular:     Rate and Rhythm: Normal rate and regular rhythm.  Pulmonary:     Effort: Pulmonary effort is normal.     Breath sounds: Normal breath sounds.  Musculoskeletal:     Right lower leg: No edema.     Left lower leg: No edema.  Lymphadenopathy:     Cervical: No cervical adenopathy.  Skin:    General: Skin is warm and dry.  Neurological:     General: No focal deficit present.     Mental Status: She is alert. Mental status is at baseline.  Psychiatric:        Mood and Affect: Mood normal.        Behavior: Behavior normal.         Assessment & Plan:   1. Hypothyroidism, unspecified type: The patient is currently on levothyroxine 100 mcg daily, this was switched since her last set of labs the end of August.  Recheck today with full thyroid panel.  - Thyroid Panel With TSH  2. Iron deficiency anemia, unspecified iron deficiency anemia type: Is on iron supplements, recheck CBC.  - CBC w/Diff/Platelet  3. Hot flashes/Irregular periods/Nipple discharge: Reviewed mammogram and left breast ultrasound from 09/11/2022, both of which were negative for abnormalities.   We will pursue hormonal work-up with FSH/LH, estradiol and testosterone levels, as well as prolactin.  - FSH/LH - Testosterone - Estradiol - Prolactin  4. Swelling of both hands/Chronic hypotension: Some symptoms she describes are consistent with autoimmune/inflammatory disorders, will obtain inflammatory labs and ANA screening labs.  Creatinine slightly increased on last set of labs, will recheck with CMP today.  Blood pressure appropriate today.  If blood work is completely normal, consider possible cardiac/pots syndrome.  Follow-up in 1 month for recheck.  - COMPLETE METABOLIC PANEL WITH GFR - C-reactive protein - Sed Rate (ESR) - Antinuclear Antib (ANA) - Metanephrines,  plasma   No follow-ups on file.   Margarita Mail, DO

## 2023-12-03 ENCOUNTER — Ambulatory Visit: Payer: Commercial Managed Care - PPO | Admitting: Internal Medicine

## 2023-12-03 ENCOUNTER — Encounter: Payer: Self-pay | Admitting: Internal Medicine

## 2023-12-03 VITALS — BP 112/76 | HR 76 | Temp 98.0°F | Resp 18 | Ht 64.0 in | Wt 192.1 lb

## 2023-12-03 DIAGNOSIS — E039 Hypothyroidism, unspecified: Secondary | ICD-10-CM

## 2023-12-03 DIAGNOSIS — N912 Amenorrhea, unspecified: Secondary | ICD-10-CM

## 2023-12-03 DIAGNOSIS — R0981 Nasal congestion: Secondary | ICD-10-CM

## 2023-12-03 DIAGNOSIS — Z13220 Encounter for screening for lipoid disorders: Secondary | ICD-10-CM

## 2023-12-03 DIAGNOSIS — D509 Iron deficiency anemia, unspecified: Secondary | ICD-10-CM

## 2023-12-04 ENCOUNTER — Encounter: Payer: Self-pay | Admitting: Internal Medicine

## 2023-12-04 LAB — HCG, QUANTITATIVE, PREGNANCY: HCG, Total, QN: <5 m[IU]/mL

## 2023-12-04 LAB — COMPLETE METABOLIC PANEL WITH GFR
AG Ratio: 1.3 (calc) (ref 1.0–2.5)
ALT: 16 U/L (ref 6–29)
AST: 14 U/L (ref 10–30)
Albumin: 3.8 g/dL (ref 3.6–5.1)
Alkaline phosphatase (APISO): 81 U/L (ref 31–125)
BUN: 13 mg/dL (ref 7–25)
CO2: 28 mmol/L (ref 20–32)
Calcium: 8.9 mg/dL (ref 8.6–10.2)
Chloride: 103 mmol/L (ref 98–110)
Creat: 0.7 mg/dL (ref 0.50–0.97)
Globulin: 3 g/dL (ref 1.9–3.7)
Glucose, Bld: 100 mg/dL — ABNORMAL HIGH (ref 65–99)
Potassium: 4.3 mmol/L (ref 3.5–5.3)
Sodium: 138 mmol/L (ref 135–146)
Total Bilirubin: 0.3 mg/dL (ref 0.2–1.2)
Total Protein: 6.8 g/dL (ref 6.1–8.1)
eGFR: 116 mL/min/{1.73_m2} (ref >=60)

## 2023-12-04 LAB — CBC WITH DIFFERENTIAL/PLATELET
Absolute Lymphocytes: 2050 {cells}/uL (ref 850–3900)
Absolute Monocytes: 410 {cells}/uL (ref 200–950)
Basophils Absolute: 41 {cells}/uL (ref 0–200)
Basophils Relative: 0.5 %
Eosinophils Absolute: 254 {cells}/uL (ref 15–500)
Eosinophils Relative: 3.1 %
HCT: 38.6 % (ref 35.0–45.0)
Hemoglobin: 12.4 g/dL (ref 11.7–15.5)
MCH: 26.7 pg — ABNORMAL LOW (ref 27.0–33.0)
MCHC: 32.1 g/dL (ref 32.0–36.0)
MCV: 83.2 fL (ref 80.0–100.0)
MPV: 10.6 fL (ref 7.5–12.5)
Monocytes Relative: 5 %
Neutro Abs: 5445 {cells}/uL (ref 1500–7800)
Neutrophils Relative %: 66.4 %
Platelets: 275 10*3/uL (ref 140–400)
RBC: 4.64 10*6/uL (ref 3.80–5.10)
RDW: 13.5 % (ref 11.0–15.0)
Total Lymphocyte: 25 %
WBC: 8.2 10*3/uL (ref 3.8–10.8)

## 2023-12-04 LAB — LIPID PANEL
Cholesterol: 179 mg/dL (ref <200)
HDL: 41 mg/dL — ABNORMAL LOW (ref >=50)
LDL Cholesterol (Calc): 106 mg/dL — ABNORMAL HIGH
Non-HDL Cholesterol (Calc): 138 mg/dL — ABNORMAL HIGH (ref <130)
Total CHOL/HDL Ratio: 4.4 (calc) (ref <5.0)
Triglycerides: 202 mg/dL — ABNORMAL HIGH (ref <150)

## 2023-12-04 LAB — TSH: TSH: 6.66 m[IU]/L — ABNORMAL HIGH

## 2023-12-04 MED ORDER — LEVOTHYROXINE SODIUM 112 MCG PO TABS
112.0000 ug | ORAL_TABLET | Freq: Every day | ORAL | 0 refills | Status: DC
Start: 2023-12-04 — End: 2024-03-11

## 2023-12-04 NOTE — Addendum Note (Signed)
Addended by: Margarita Mail on: 12/04/2023 08:56 AM   Modules accepted: Orders

## 2024-01-15 ENCOUNTER — Encounter: Payer: Self-pay | Admitting: Internal Medicine

## 2024-03-09 ENCOUNTER — Telehealth: Payer: Self-pay | Admitting: Emergency Medicine

## 2024-03-09 ENCOUNTER — Other Ambulatory Visit: Payer: Self-pay | Admitting: Emergency Medicine

## 2024-03-09 DIAGNOSIS — E039 Hypothyroidism, unspecified: Secondary | ICD-10-CM

## 2024-03-09 NOTE — Telephone Encounter (Unsigned)
 Copied from CRM [id number]. Topic: Clinical - Request for Lab/Test Order >> Mar 09, 2024 12:55 PM Christine Herring T wrote: Reason for CRM: patient is requesting an order to check her thyroid as she is out of medication. Please f/u with patient to let her know when she can come in for her labs

## 2024-03-10 LAB — TSH: TSH: 0.28 m[IU]/L — ABNORMAL LOW

## 2024-03-10 NOTE — Telephone Encounter (Unsigned)
 Copied from CRM [id number]. Topic: Clinical - Prescription Issue >> Mar 10, 2024  3:05 PM Christine Herring wrote: Reason for CRM: The patient is requesting to see if her levothyroxine (SYNTHROID) 112 MCG tablet will be changed as she received her test results via MyChart. I did advise her that the results have not been reviewed at this time and she will be contacted once they have. I do see that a CRM was sent up for this previously. However, she is also requesting to [facility] Address: [address] [place], [place] [address] Phone: [phone] for this prescription and all future prescriptions as well. I have added this to her chart.

## 2024-03-11 ENCOUNTER — Other Ambulatory Visit: Payer: Self-pay | Admitting: Internal Medicine

## 2024-03-11 DIAGNOSIS — E039 Hypothyroidism, unspecified: Secondary | ICD-10-CM

## 2024-03-11 MED ORDER — LEVOTHYROXINE SODIUM 100 MCG PO TABS
ORAL_TABLET | ORAL | 1 refills | Status: DC
Start: 2024-03-11 — End: 2024-05-13

## 2024-03-11 MED ORDER — LEVOTHYROXINE SODIUM 112 MCG PO TABS: ORAL_TABLET | ORAL | 1 refills | Status: DC

## 2024-05-07 ENCOUNTER — Other Ambulatory Visit: Payer: Self-pay | Admitting: Internal Medicine

## 2024-05-07 DIAGNOSIS — E039 Hypothyroidism, unspecified: Secondary | ICD-10-CM

## 2024-05-07 NOTE — Telephone Encounter (Unsigned)
 Copied from CRM [id number]. Topic: Clinical - Medication Refill >> May 07, 2024  2:15 PM Fonda T wrote: Medication:  levothyroxine  (SYNTHROID ) 100 MCG tablet  levothyroxine  (SYNTHROID ) 112 MCG tablet   Has the patient contacted their pharmacy? Yes Per pharmacy on refills on medications (Agent: If no, request that the patient contact the pharmacy for the refill. If patient does not wish to contact the pharmacy document the reason why and proceed with request.) (Agent: If yes, when and what did the pharmacy advise?)  This is the patient's preferred pharmacy:   [id number] [place], [place] - [address] [place] [address] Phone: [phone] Fax: [phone]   Is this the correct pharmacy for this prescription? Yes If no, delete pharmacy and type the correct one.   Has the prescription been filled recently? Yes  Is the patient out of the medication? yes  Has the patient been seen for an appointment in the last year OR does the patient have an upcoming appointment? Yes  Can we respond through MyChart? No Patient prefers to be contacted via phone  Agent: Please be advised that Rx refills may take up to 3 business days. We ask that you follow-up with your pharmacy.

## 2024-05-10 NOTE — Telephone Encounter (Signed)
 Requested medications are due for refill today.  yes  Requested medications are on the active medications list.  yes  Last refill. 03/11/2024 #30 1 rf  Future visit scheduled.   no  Notes to clinic.  Abnormal labs.    Requested Prescriptions  Pending Prescriptions Disp Refills   levothyroxine  (SYNTHROID ) 100 MCG tablet 30 tablet 1    Sig: Take 100 mcg daily 4 days a week, alternated with 112 mcg dose.     Endocrinology:  Hypothyroid Agents Failed - 05/10/2024  5:19 PM      Failed - TSH in normal range and within 360 days    TSH  Date Value Ref Range Status  03/09/2024 0.28 (L) mIU/L Final    Comment:              Reference Range .           > or = 20 Years  0.40-4.50 .                Pregnancy Ranges           First trimester    0.26-2.66           Second trimester   0.55-2.73           Third trimester    0.43-2.91          Failed - Valid encounter within last 12 months    Recent Outpatient Visits   None             levothyroxine  (SYNTHROID ) 112 MCG tablet 30 tablet 1    Sig: Take 112 mcg daily 3 days a week, alternated with 100 mcg dose.     Endocrinology:  Hypothyroid Agents Failed - 05/10/2024  5:19 PM      Failed - TSH in normal range and within 360 days    TSH  Date Value Ref Range Status  03/09/2024 0.28 (L) mIU/L Final    Comment:              Reference Range .           > or = 20 Years  0.40-4.50 .                Pregnancy Ranges           First trimester    0.26-2.66           Second trimester   0.55-2.73           Third trimester    0.43-2.91          Failed - Valid encounter within last 12 months    Recent Outpatient Visits   None

## 2024-05-11 ENCOUNTER — Other Ambulatory Visit: Payer: Self-pay

## 2024-05-11 DIAGNOSIS — E039 Hypothyroidism, unspecified: Secondary | ICD-10-CM

## 2024-05-12 ENCOUNTER — Ambulatory Visit: Payer: Self-pay | Admitting: Internal Medicine

## 2024-05-12 LAB — TSH: TSH: 0.87 m[IU]/L

## 2024-05-13 ENCOUNTER — Other Ambulatory Visit: Payer: Self-pay | Admitting: Emergency Medicine

## 2024-05-13 DIAGNOSIS — E039 Hypothyroidism, unspecified: Secondary | ICD-10-CM

## 2024-05-13 MED ORDER — LEVOTHYROXINE SODIUM 112 MCG PO TABS: ORAL_TABLET | ORAL | 0 refills | Status: DC

## 2024-05-13 MED ORDER — LEVOTHYROXINE SODIUM 100 MCG PO TABS: ORAL_TABLET | ORAL | 0 refills | Status: DC

## 2024-05-14 ENCOUNTER — Encounter: Payer: Self-pay | Admitting: Internal Medicine

## 2024-05-14 ENCOUNTER — Ambulatory Visit (INDEPENDENT_AMBULATORY_CARE_PROVIDER_SITE_OTHER): Admitting: Internal Medicine

## 2024-05-14 ENCOUNTER — Other Ambulatory Visit: Payer: Self-pay

## 2024-05-14 VITALS — BP 112/70 | HR 66 | Temp 97.8°F | Resp 16 | Ht 64.0 in | Wt 183.9 lb

## 2024-05-14 DIAGNOSIS — E039 Hypothyroidism, unspecified: Secondary | ICD-10-CM | POA: Diagnosis not present

## 2024-05-14 NOTE — Progress Notes (Signed)
 Established Patient Office Visit  Subjective    Patient ID: Christine Herring, female    DOB: [birth year]  Age: 38 y.o. MRN: [id number]  CC:  Chief Complaint  Patient presents with   Medical Management of Chronic Issues    HPI Christine Herring presents to follow up on chronic medical conditions.   Discussed the use of AI scribe software for clinical note transcription with the patient, who gave verbal consent to proceed.  History of Present Illness [name] is a 38 year old female with thyroid  issues who presents for follow-up on her thyroid  management.  She experiences fluctuations in thyroid  labs and alternates between 112 mcg and 100 mcg of her thyroid  medication. A recent delay in medication refill resulted in skin peeling after five days without medication. In December, her TSH level was 6.66, with symptoms including joint pain, weight gain to 195 pounds, dry skin, anxiety, extreme exhaustion, and feeling cold. These symptoms were atypical, as she usually experiences anxiety and exhaustion.  She has removed gluten from her diet, which has helped alleviate joint pain and inflammation. She continues to consume dairy and cheese. Despite dietary changes, her weight remains around 180 pounds, and she is limited in physical activity due to hip issues.  Her family history includes thyroid  issues in her mother, grandfather, and father. Her younger sister and brother do not have thyroid  issues. Her daughter has stomach issues and migraines, and both have adopted a gluten-free diet, which seems beneficial for her daughter's symptoms.  Hypothyroidism: -Medications: Levothyroxine  100 mcg alternated with Levothyroxine  112 mcg -Patient is compliant with the above medication (s) at the above dose -Last TSH: 4.92 08/21/22 -Had been following with Endocrinology, uncertain if this is still the case  Asthma:  -Asthma status: stable -Current Treatments: Albuterol  PRN -Satisfied with current  treatment?: yes  Congenital Hip Dysplasia: -Following with [facility] -Underwent bilateral hip replacements  History of Seizures: -In childhood  -Had been on Phenobarbital - had an unspecified reaction and is no longer on this medication.  She is currently not on any medications to prevent seizure-like activity. -States when she passes out is when she has seizures, this has not happened since 2013 when she was overheated, lost consciousness and had seizure-like activity at that time -She does not have a neurologist who she follows with regularly.   Outpatient Encounter Medications as of 05/14/2024  Medication Sig   Emollient (COLLAGEN EX) Apply topically.   Ferrous Sulfate  (IRON) 325 (65 Fe) MG TABS Take by mouth.   levothyroxine  (SYNTHROID ) 100 MCG tablet Take 100 mcg daily 4 days a week, alternated with 112 mcg dose.   levothyroxine  (SYNTHROID ) 112 MCG tablet Take 112 mcg daily 3 days a week, alternated with 100 mcg dose.   loratadine (CLARITIN) 10 MG tablet Take 10 mg by mouth daily.   Multiple Vitamin (MULTIVITAMIN) tablet Take 1 tablet by mouth daily.   Vitamin D , Ergocalciferol , (DRISDOL ) 1.25 MG (50000 UNIT) CAPS capsule Take 1 capsule (50,000 Units total) by mouth once a week. For 12 weeks. Then start OTC Vitamin D3 2,000 unit daily.   No facility-administered encounter medications on file as of 05/14/2024.    Past Medical History:  Diagnosis Date   Anxiety    Hip dysplasia    Osteoporosis    Seizures (HCC)    when she was a child reason not know   Thyroid  disease     Past Surgical History:  Procedure Laterality Date   OVARIAN CYST REMOVAL  TOTAL HIP ARTHROPLASTY Bilateral    TUBAL LIGATION Bilateral 05/23/2012   [facility]    Family History  Problem Relation Age of Onset   Thyroid  disease Mother    Hyperlipidemia Father    Thyroid  disease Father    Breast cancer Maternal Aunt    Colon cancer Maternal Aunt        grt    Ovarian cancer Paternal  Grandmother    Diabetes Neg Hx    Heart disease Neg Hx     Social History   Socioeconomic History   Marital status: Single    Spouse name: Not on file   Number of children: Not on file   Years of education: Not on file   Highest education level: Not on file  Occupational History   Not on file  Tobacco Use   Smoking status: Former    Current packs/day: 0.00    Average packs/day: 0.5 packs/day for 10.0 years (5.0 ttl pk-yrs)    Types: Cigarettes    Start date: 07/17/1995    Quit date: 07/16/2005    Years since quitting: 18.8   Smokeless tobacco: Never  Vaping Use   Vaping status: Never Used  Substance and Sexual Activity   Alcohol use: Yes    Comment: social   Drug use: No   Sexual activity: Yes    Birth control/protection: Surgical  Other Topics Concern   Not on file  Social History Narrative   Not on file   Social Drivers of Health   Financial Resource Strain: Not on file  Food Insecurity: Not on file  Transportation Needs: Not on file  Physical Activity: Not on file  Stress: Not on file  Social Connections: Not on file  Intimate Partner Violence: Not on file    Review of Systems  All other systems reviewed and are negative.       Objective    BP 112/70 (Cuff Size: Normal)   Pulse 66   Temp 97.8 F (36.6 C) (Oral)   Resp 16   Ht 5\' 4"  (1.626 m)   Wt 183 lb 14.4 oz (83.4 kg)   SpO2 99%   BMI 31.57 kg/m   Physical Exam Constitutional:      Appearance: Normal appearance.  HENT:     Head: Normocephalic and atraumatic.     Right Ear: External ear normal.  Eyes:     Conjunctiva/sclera: Conjunctivae normal.  Cardiovascular:     Rate and Rhythm: Normal rate and regular rhythm.  Pulmonary:     Effort: Pulmonary effort is normal.     Breath sounds: Normal breath sounds.  Skin:    General: Skin is warm and dry.  Neurological:     General: No focal deficit present.     Mental Status: She is alert. Mental status is at baseline.  Psychiatric:         Mood and Affect: Mood normal.        Behavior: Behavior normal.         Assessment & Plan:   Assessment & Plan Hypothyroidism Chronic hypothyroidism with fluctuating thyroid  levels. Current levothyroxine  regimen effective. Symptoms include xerosis, arthralgia, weight gain, and fatigue. Positive ANA, inconclusive rheumatology evaluation. Gluten-free diet alleviated some symptoms. Family history suggests genetic component. Ongoing monitoring and medication adjustment required. - Continue levothyroxine  regimen (alternating 112 mcg and 100 mcg). - Monitor thyroid  levels regularly. - Consider Hashimoto's thyroiditis testing during next thyroid  lab evaluation. - Re-evaluate thyroid  function and symptoms in three  months. - Discuss weight management medications (semaglutide, tirzepatide) if thyroid  stabilizes and symptoms persist. - Plan comprehensive lab evaluation, including food allergy and celiac testing, at next visit.    Return in about 3 months (around 08/14/2024).   Rockney Cid, DO

## 2024-08-06 ENCOUNTER — Other Ambulatory Visit: Payer: Self-pay | Admitting: Internal Medicine

## 2024-08-06 DIAGNOSIS — E039 Hypothyroidism, unspecified: Secondary | ICD-10-CM

## 2024-08-06 NOTE — Telephone Encounter (Unsigned)
 Copied from CRM #[id number]. Topic: Clinical - Medication Refill >> Aug 06, 2024  3:59 PM Everette C wrote: Medication: levothyroxine  (SYNTHROID ) 112 MCG tablet  levothyroxine  (SYNTHROID ) 100 MCG tablet   Has the patient contacted their pharmacy? Yes (Agent: If no, request that the patient contact the pharmacy for the refill. If patient does not wish to contact the pharmacy document the reason why and proceed with request.) (Agent: If yes, when and what did the pharmacy advise?)  This is the patient's preferred pharmacy:  [id number] [address] [place], [place] - [address] [facility] [address] Phone: [phone] Fax: [phone]  Is this the correct pharmacy for this prescription? Yes If no, delete pharmacy and type the correct one.   Has the prescription been filled recently? Yes  Is the patient out of the medication? Yes  Has the patient been seen for an appointment in the last year OR does the patient have an upcoming appointment? Yes  Can we respond through MyChart? No  Agent: Please be advised that Rx refills may take up to 3 business days. We ask that you follow-up with your pharmacy.

## 2024-08-10 MED ORDER — LEVOTHYROXINE SODIUM 100 MCG PO TABS: ORAL_TABLET | ORAL | 0 refills | Status: DC

## 2024-08-10 MED ORDER — LEVOTHYROXINE SODIUM 112 MCG PO TABS: ORAL_TABLET | ORAL | 0 refills | Status: DC

## 2024-08-10 NOTE — Telephone Encounter (Signed)
 Requested by patient. Future visit in 3 days .  Requested Prescriptions  Pending Prescriptions Disp Refills   levothyroxine  (SYNTHROID ) 100 MCG tablet 90 tablet 0    Sig: Take 100 mcg daily 4 days a week, alternated with 112 mcg dose.     Endocrinology:  Hypothyroid Agents Passed - 08/10/2024 11:28 AM      Passed - TSH in normal range and within 360 days    TSH  Date Value Ref Range Status  05/11/2024 0.87 mIU/L Final    Comment:              Reference Range .           > or = 20 Years  0.40-4.50 .                Pregnancy Ranges           First trimester    0.26-2.66           Second trimester   0.55-2.73           Third trimester    0.43-2.91          Passed - Valid encounter within last 12 months    Recent Outpatient Visits           2 months ago Hypothyroidism, unspecified type   [facility] Bernardo Fend, DO               levothyroxine  (SYNTHROID ) 112 MCG tablet 90 tablet 0    Sig: Take 112 mcg daily 3 days a week, alternated with 100 mcg dose.     Endocrinology:  Hypothyroid Agents Passed - 08/10/2024 11:28 AM      Passed - TSH in normal range and within 360 days    TSH  Date Value Ref Range Status  05/11/2024 0.87 mIU/L Final    Comment:              Reference Range .           > or = 20 Years  0.40-4.50 .                Pregnancy Ranges           First trimester    0.26-2.66           Second trimester   0.55-2.73           Third trimester    0.43-2.91          Passed - Valid encounter within last 12 months    Recent Outpatient Visits           2 months ago Hypothyroidism, unspecified type   [facility] Bernardo Fend, [place]

## 2024-08-13 ENCOUNTER — Ambulatory Visit: Admitting: Internal Medicine

## 2025-01-16 ENCOUNTER — Other Ambulatory Visit: Payer: Self-pay | Admitting: Internal Medicine

## 2025-01-16 DIAGNOSIS — E039 Hypothyroidism, unspecified: Secondary | ICD-10-CM

## 2025-01-17 NOTE — Telephone Encounter (Signed)
 Requested Prescriptions  Pending Prescriptions Disp Refills   levothyroxine  (SYNTHROID ) 100 MCG tablet [Pharmacy Med Name: LEVOTHYROXINE  100 MCG TABLET] 48 tablet 1    Sig: TAKE 100 MCG DAILY 4 DAYS A WEEK, ALTERNATED WITH 112 MCG DOSE.     Endocrinology:  Hypothyroid Agents Passed - 01/17/2025  1:46 PM      Passed - TSH in normal range and within 360 days    TSH  Date Value Ref Range Status  05/11/2024 0.87 mIU/L Final    Comment:              Reference Range .           > or = 20 Years  0.40-4.50 .                Pregnancy Ranges           First trimester    0.26-2.66           Second trimester   0.55-2.73           Third trimester    0.43-2.91          Passed - Valid encounter within last 12 months    Recent Outpatient Visits           8 months ago Hypothyroidism, unspecified type   [facility] Bernardo Fend, [place]

## 2025-01-23 ENCOUNTER — Other Ambulatory Visit: Payer: Self-pay | Admitting: Internal Medicine

## 2025-01-23 DIAGNOSIS — E039 Hypothyroidism, unspecified: Secondary | ICD-10-CM
